# Patient Record
Sex: Female | Born: 1955 | Race: Black or African American | Hispanic: No | Smoking: Current every day smoker
Health system: Southern US, Community
[De-identification: ages and names within clinical notes are randomized; demographics above are authoritative.]

## PROBLEM LIST (undated history)

## (undated) DIAGNOSIS — Z800 Family history of malignant neoplasm of digestive organs: Secondary | ICD-10-CM

## (undated) DIAGNOSIS — Z8042 Family history of malignant neoplasm of prostate: Secondary | ICD-10-CM

## (undated) DIAGNOSIS — I10 Essential (primary) hypertension: Secondary | ICD-10-CM

## (undated) DIAGNOSIS — Z803 Family history of malignant neoplasm of breast: Secondary | ICD-10-CM

## (undated) DIAGNOSIS — E785 Hyperlipidemia, unspecified: Secondary | ICD-10-CM

## (undated) DIAGNOSIS — Z801 Family history of malignant neoplasm of trachea, bronchus and lung: Secondary | ICD-10-CM

## (undated) DIAGNOSIS — Z1379 Encounter for other screening for genetic and chromosomal anomalies: Secondary | ICD-10-CM

## (undated) DIAGNOSIS — L930 Discoid lupus erythematosus: Secondary | ICD-10-CM

## (undated) DIAGNOSIS — Z23 Encounter for immunization: Secondary | ICD-10-CM

## (undated) HISTORY — DX: Family history of malignant neoplasm of trachea, bronchus and lung: Z80.1

## (undated) HISTORY — DX: Family history of malignant neoplasm of digestive organs: Z80.0

## (undated) HISTORY — DX: Essential (primary) hypertension: I10

## (undated) HISTORY — DX: Family history of malignant neoplasm of breast: Z80.3

## (undated) HISTORY — DX: Encounter for immunization: Z23

## (undated) HISTORY — DX: Family history of malignant neoplasm of prostate: Z80.42

## (undated) HISTORY — DX: Hyperlipidemia, unspecified: E78.5

## (undated) HISTORY — DX: Discoid lupus erythematosus: L93.0

---
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
## 1999-09-18 ENCOUNTER — Other Ambulatory Visit: Admission: RE | Admit: 1999-09-18 | Discharge: 1999-09-18 | Payer: Self-pay | Admitting: Obstetrics and Gynecology

## 1999-09-25 ENCOUNTER — Encounter: Payer: Self-pay | Admitting: Obstetrics and Gynecology

## 1999-09-25 ENCOUNTER — Ambulatory Visit (HOSPITAL_COMMUNITY): Admission: RE | Admit: 1999-09-25 | Discharge: 1999-09-25 | Payer: Self-pay | Admitting: Obstetrics and Gynecology

## 1999-10-05 ENCOUNTER — Encounter: Payer: Self-pay | Admitting: Obstetrics and Gynecology

## 1999-10-05 ENCOUNTER — Encounter: Admission: RE | Admit: 1999-10-05 | Discharge: 1999-10-05 | Payer: Self-pay | Admitting: Obstetrics and Gynecology

## 2000-10-10 ENCOUNTER — Encounter: Payer: Self-pay | Admitting: Obstetrics and Gynecology

## 2000-10-10 ENCOUNTER — Ambulatory Visit (HOSPITAL_COMMUNITY): Admission: RE | Admit: 2000-10-10 | Discharge: 2000-10-10 | Payer: Self-pay | Admitting: Obstetrics and Gynecology

## 2001-09-29 ENCOUNTER — Other Ambulatory Visit: Admission: RE | Admit: 2001-09-29 | Discharge: 2001-09-29 | Payer: Self-pay | Admitting: Obstetrics and Gynecology

## 2001-10-16 ENCOUNTER — Encounter: Payer: Self-pay | Admitting: Obstetrics and Gynecology

## 2001-10-16 ENCOUNTER — Ambulatory Visit (HOSPITAL_COMMUNITY): Admission: RE | Admit: 2001-10-16 | Discharge: 2001-10-16 | Payer: Self-pay | Admitting: Obstetrics and Gynecology

## 2002-03-25 ENCOUNTER — Encounter: Payer: Self-pay | Admitting: Nephrology

## 2002-03-25 ENCOUNTER — Encounter: Admission: RE | Admit: 2002-03-25 | Discharge: 2002-03-25 | Payer: Self-pay | Admitting: Nephrology

## 2002-11-23 ENCOUNTER — Other Ambulatory Visit: Admission: RE | Admit: 2002-11-23 | Discharge: 2002-11-23 | Payer: Self-pay | Admitting: Obstetrics and Gynecology

## 2002-12-07 ENCOUNTER — Ambulatory Visit (HOSPITAL_COMMUNITY): Admission: RE | Admit: 2002-12-07 | Discharge: 2002-12-07 | Payer: Self-pay | Admitting: Obstetrics and Gynecology

## 2002-12-07 ENCOUNTER — Encounter: Payer: Self-pay | Admitting: Obstetrics and Gynecology

## 2003-12-16 ENCOUNTER — Ambulatory Visit (HOSPITAL_COMMUNITY): Admission: RE | Admit: 2003-12-16 | Discharge: 2003-12-16 | Payer: Self-pay | Admitting: Obstetrics and Gynecology

## 2005-02-07 ENCOUNTER — Ambulatory Visit (HOSPITAL_COMMUNITY): Admission: RE | Admit: 2005-02-07 | Discharge: 2005-02-07 | Payer: Self-pay | Admitting: Nephrology

## 2005-07-22 ENCOUNTER — Other Ambulatory Visit: Admission: RE | Admit: 2005-07-22 | Discharge: 2005-07-22 | Payer: Self-pay | Admitting: Obstetrics and Gynecology

## 2006-03-14 ENCOUNTER — Ambulatory Visit (HOSPITAL_COMMUNITY): Admission: RE | Admit: 2006-03-14 | Discharge: 2006-03-14 | Payer: Self-pay | Admitting: Nephrology

## 2006-03-14 IMAGING — MG MM DIGITAL SCREENING BILAT
4 series · 4 of 4 positions shown · non-contrast
Comparison: none

DG SCREEN MAMMOGRAM BILATERAL
Bilateral CC and MLO view(s) were taken.

DIGITAL SCREENING MAMMOGRAM WITH CAD:
There is a  dense fibroglandular pattern.  No masses or malignant type calcifications are 
identified.  Compared with prior studies.

[R CC]
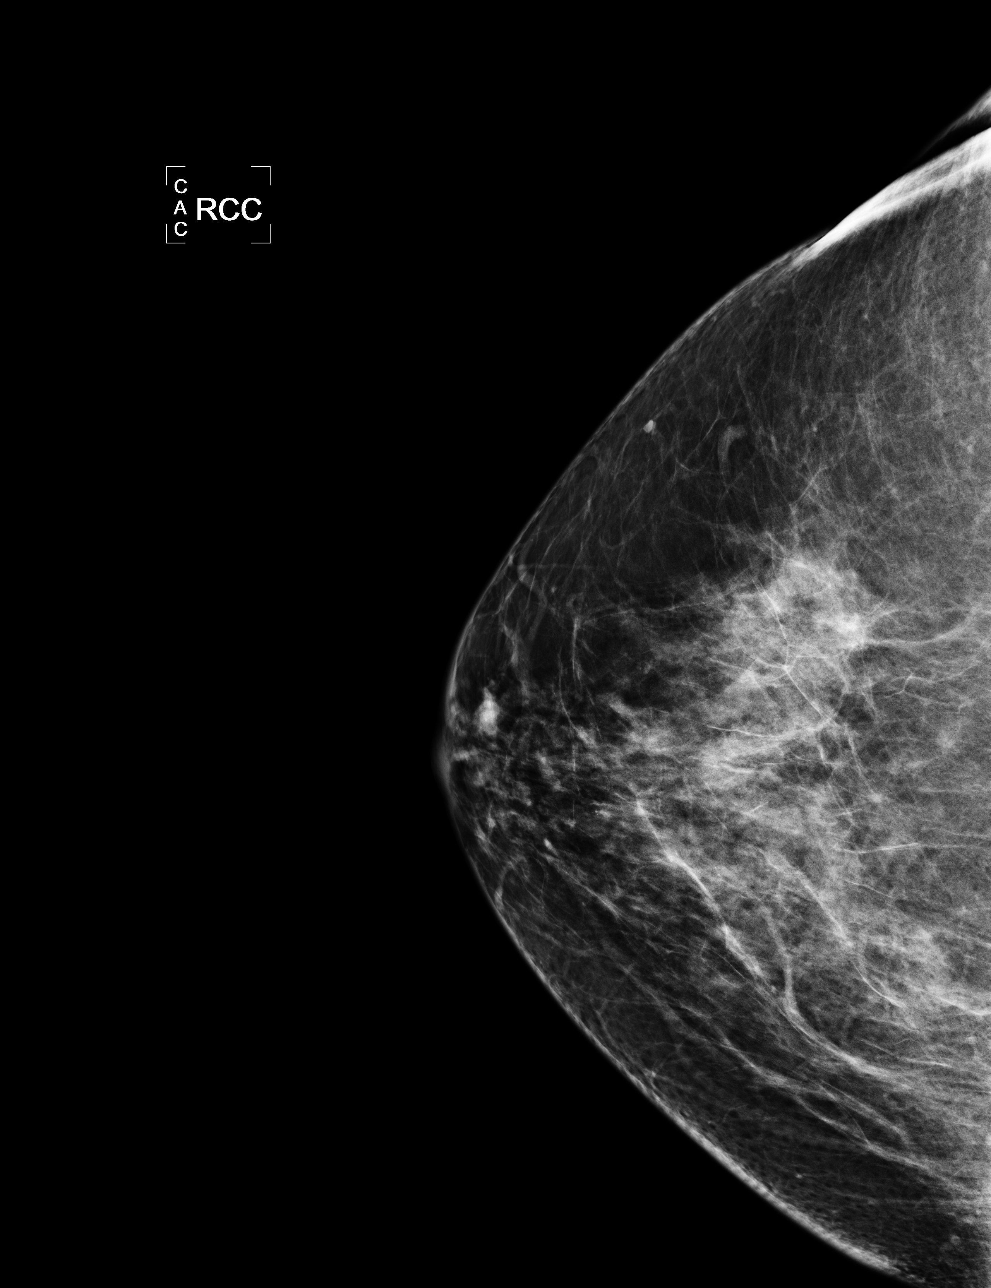

[R MLO]
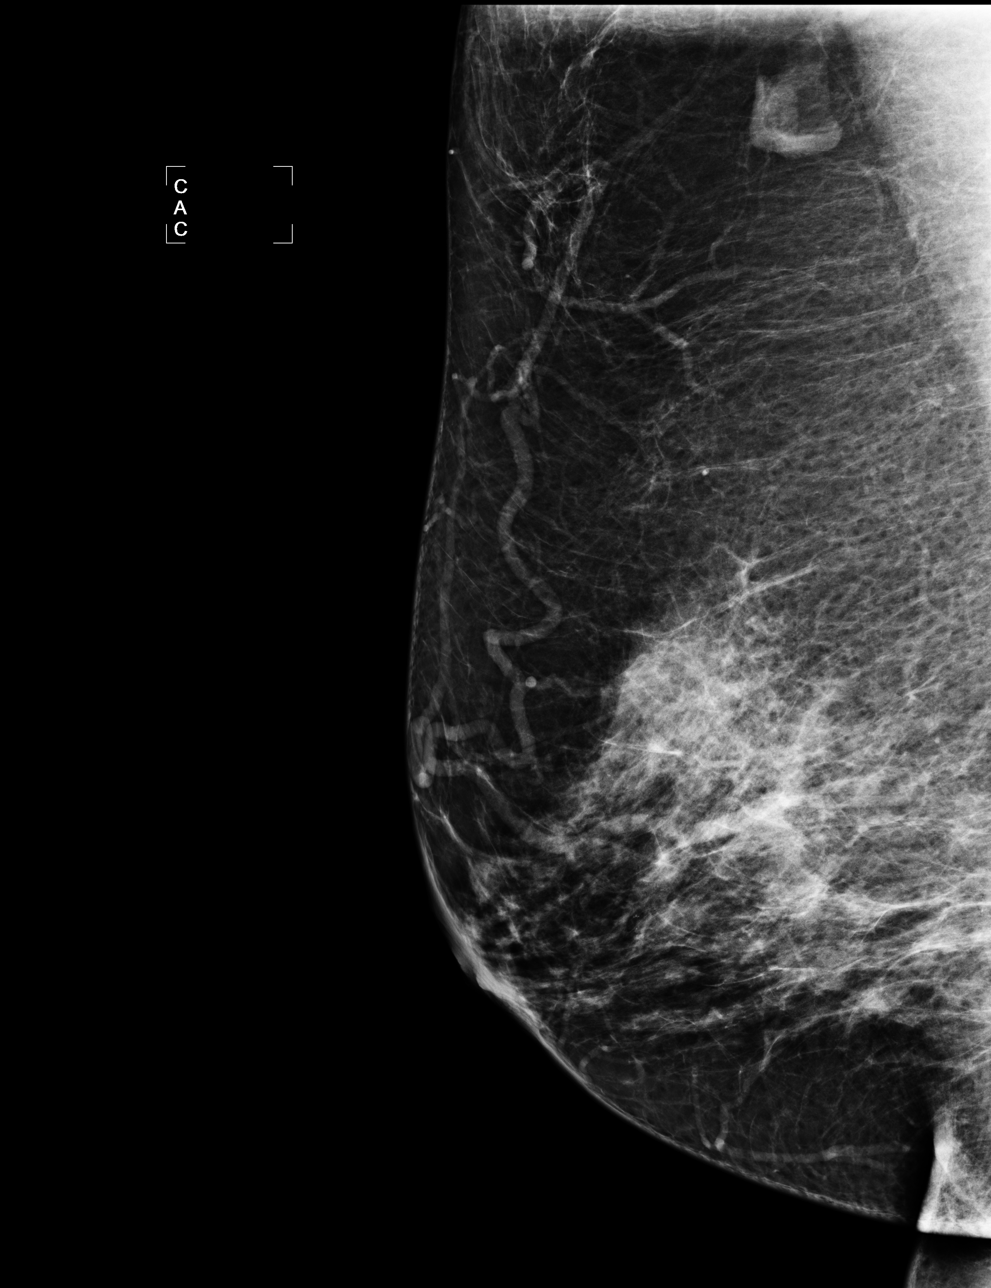

[L CC]
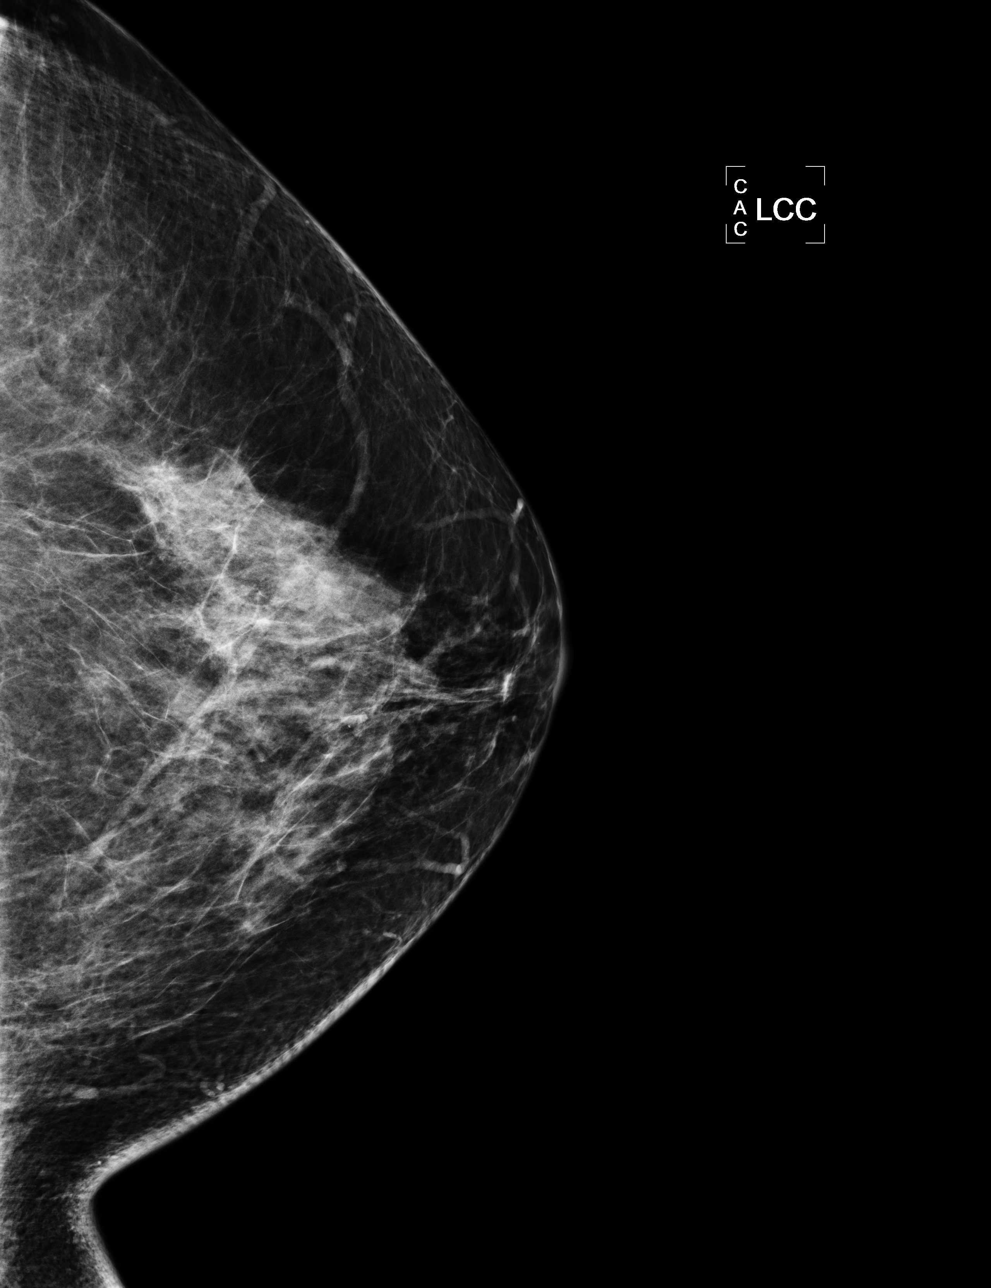

[L MLO]
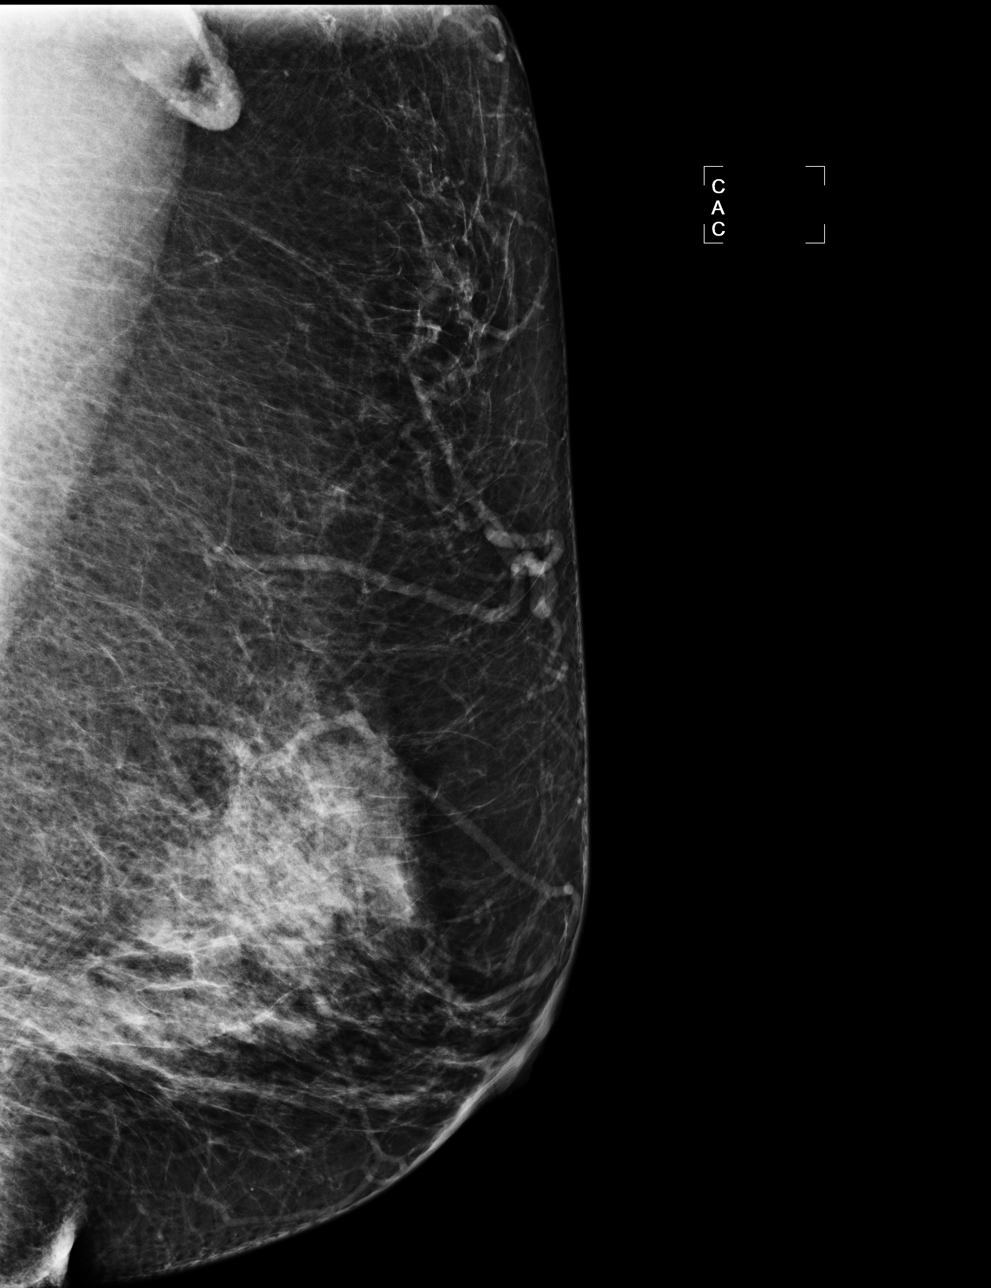

[4 of 4 positions shown; findings below may reference images not displayed]

IMPRESSION: No specific mammographic evidence of malignancy.  Next screening mammogram is recommended in one 
year.

ASSESSMENT: Negative - BI-RADS 1

Screening mammogram in 1 year.
ANALYZED BY COMPUTER AIDED DETECTION. , THIS PROCEDURE WAS A DIGITAL MAMMOGRAM.

## 2006-04-28 ENCOUNTER — Encounter: Admission: RE | Admit: 2006-04-28 | Discharge: 2006-04-28 | Payer: Self-pay | Admitting: Nephrology

## 2006-04-28 IMAGING — CR DG CHEST 2V
1 series · 1 of 1 positions shown · non-contrast
Comparison: none

CLINICAL DATA: Cough. 
 TWO VIEW CHEST: 
 Lower lungs are clear.  Cardiac and mediastinal contours are normal.  Osseous structures are unremarkable.

[w chest pa]
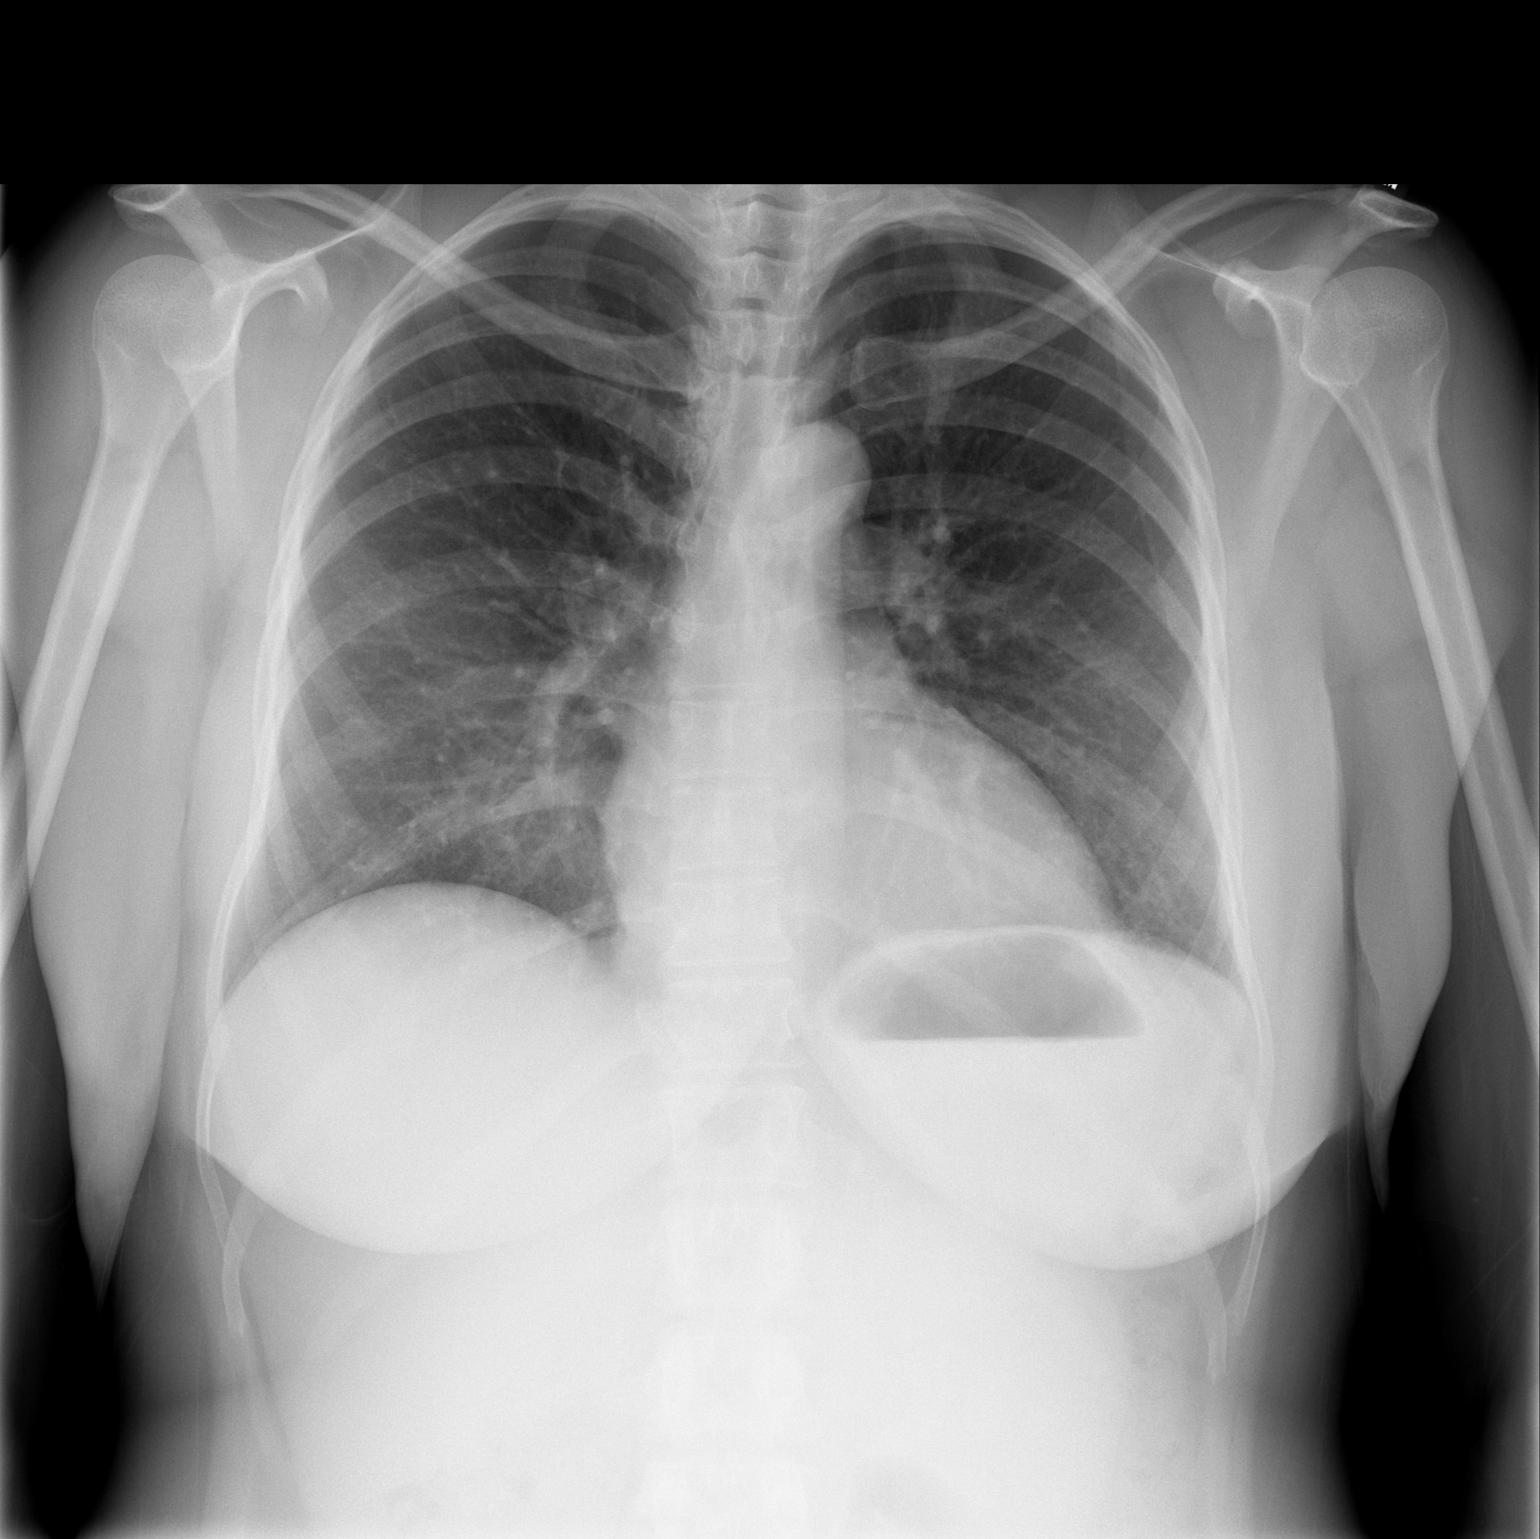

[1 of 1 positions shown; findings below may reference images not displayed]

IMPRESSION: Negative for acute cardiopulmonary process.

## 2006-04-28 IMAGING — CR DG KNEE COMPLETE 4+V*L*
4 series · 4 of 4 positions shown · non-contrast
Comparison: none

CLINICAL DATA: Dizziness and black out with fall on [REDACTED], [DATE].  Injured bilateral knees.  Bilateral anterior knee pain. 
 LEFT KNEE ? 4 VIEW:

[t knee ap left (1 of 2)]
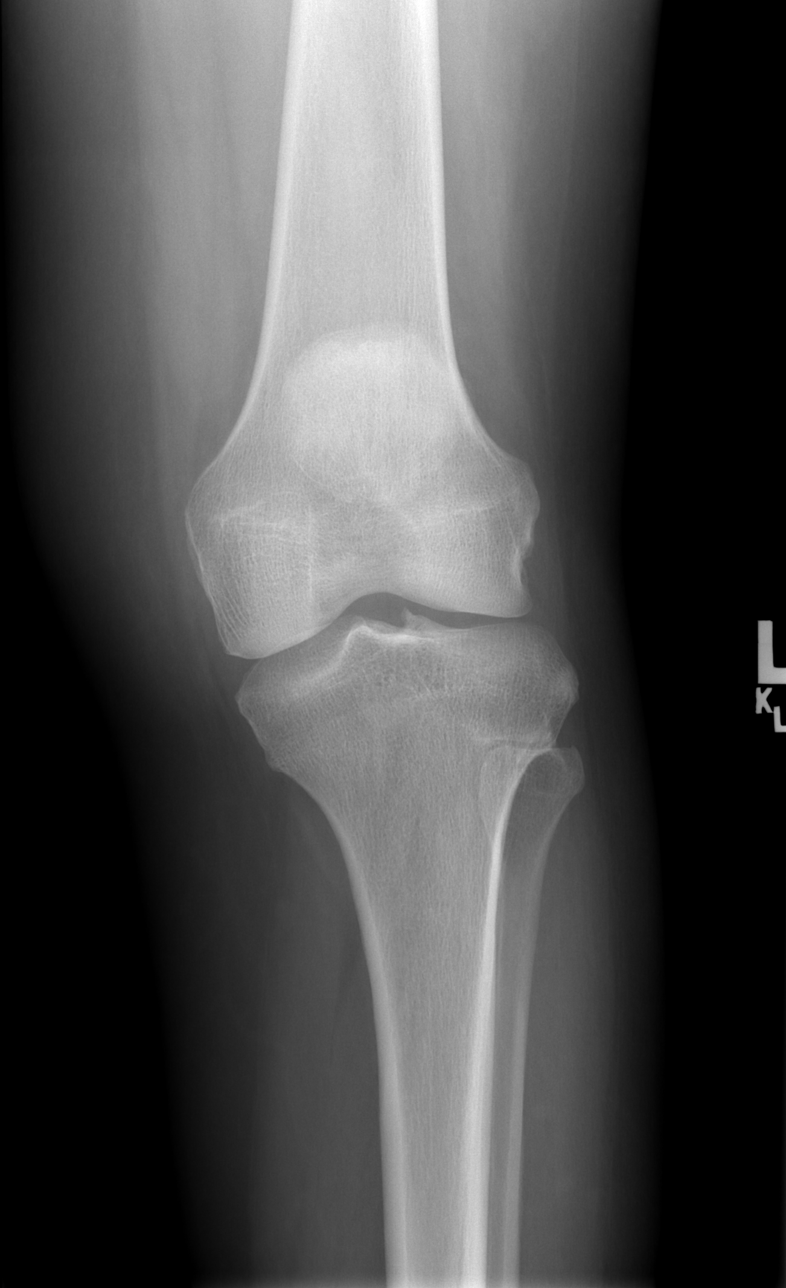

[t knee ap left (2 of 2)]
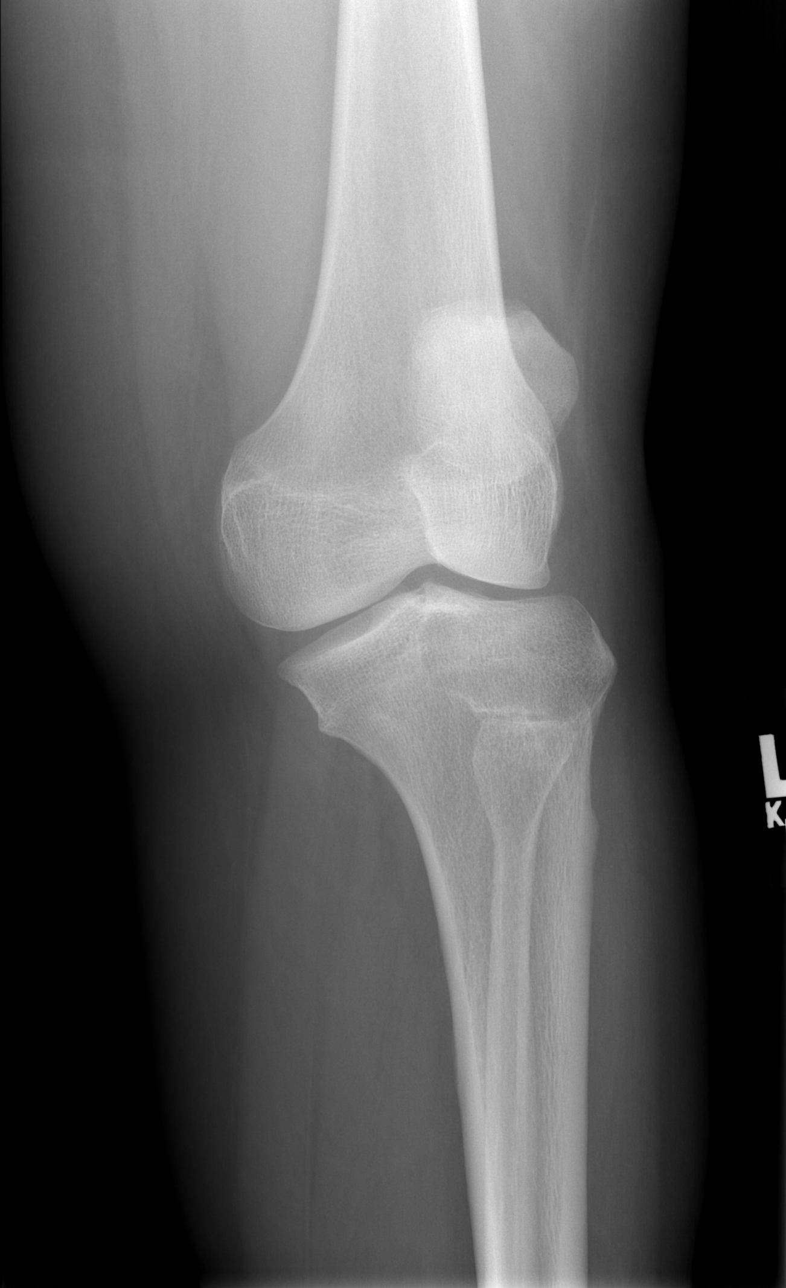

[t knee lat left (1 of 2)]
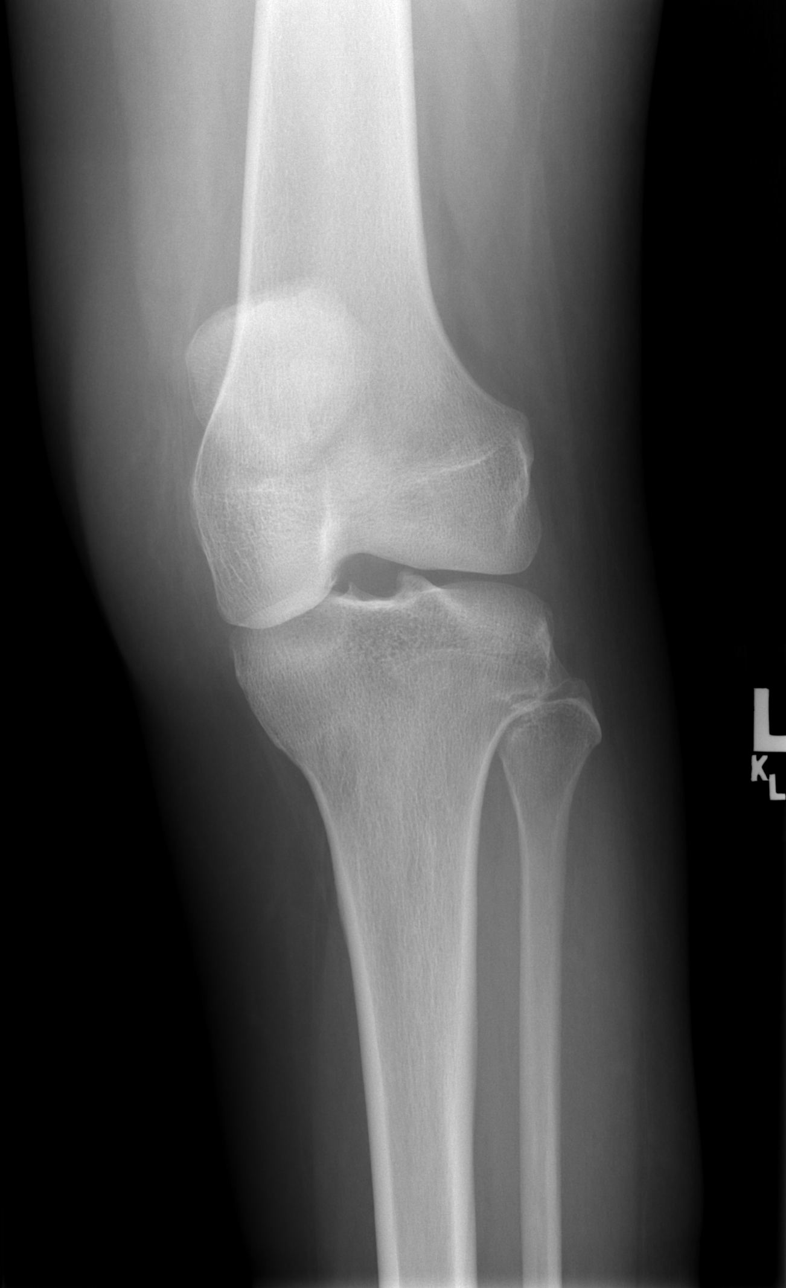

[t knee lat left (2 of 2)]
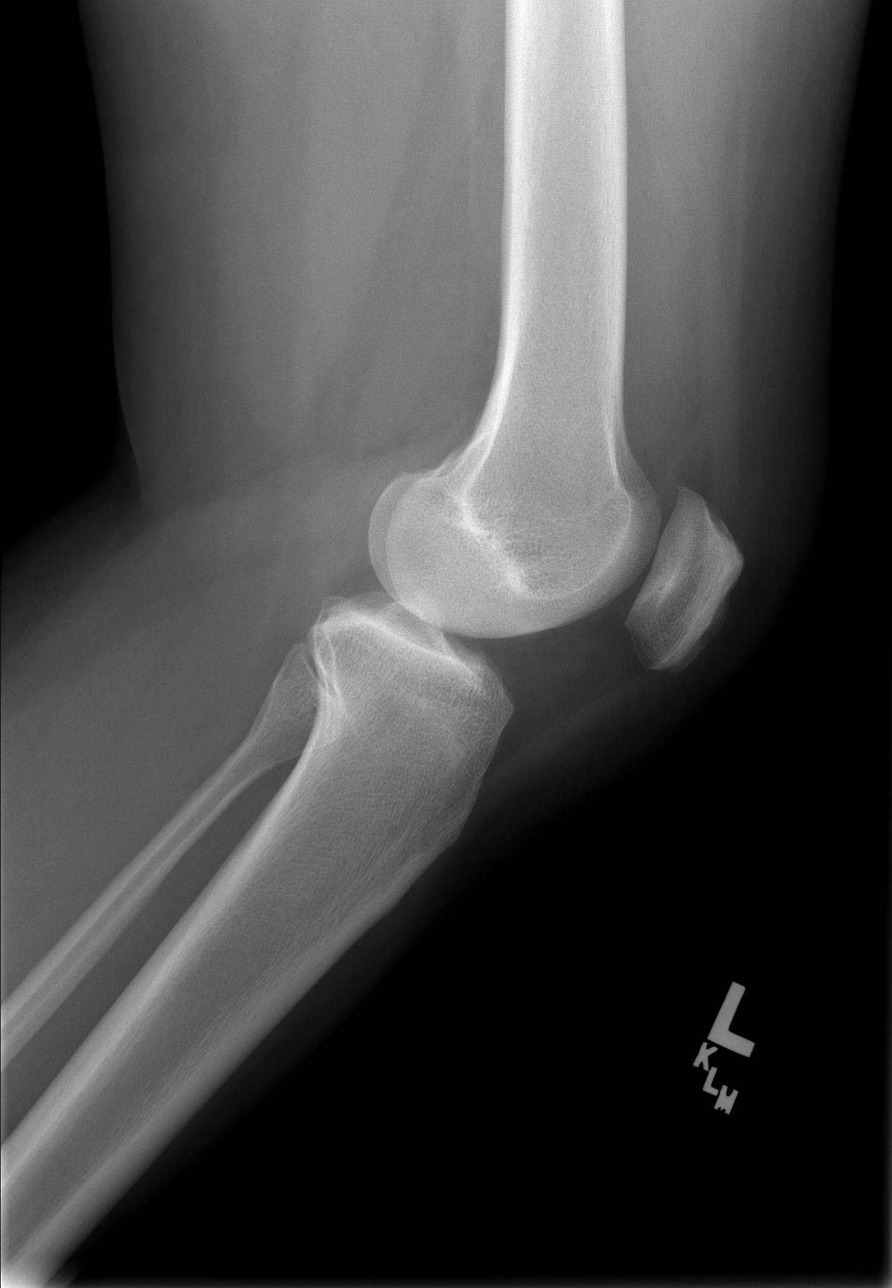

[4 of 4 positions shown; findings below may reference images not displayed]

FINDINGS: Prepatellar soft tissue swelling without underlying fracture or joint effusion.
IMPRESSION: Prepatellar soft tissue swelling without fracture. 
 RIGHT KNEE ? 4 VIEW:
FINDINGS: Prepatellar soft tissue swelling without fracture.
IMPRESSION: Prepatellar soft tissue swelling without fracture.

## 2006-04-28 IMAGING — CT CT HEAD W/O CM
1 series · 16 of 28 positions shown, 20 images · IV contrast (agent unspecified)
Comparison: none

CLINICAL DATA: Dizziness.  Syncope. 
 HEAD CT WITHOUT CONTRAST:
TECHNIQUE: Contiguous axial images were obtained from the base of the skull through the vertex according to standard protocol without contrast.

[Series 32: 3d filtered head · axial · 0.49mm/px · z∈[+6,+135]mm · 16 of 28 slices shown, 20 images]
[im 2/28  brain]
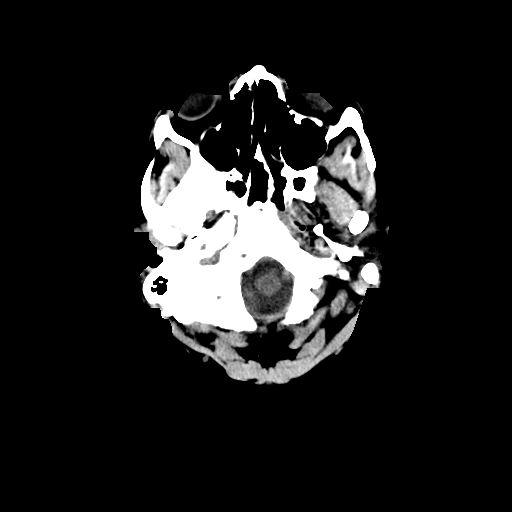
[im 2/28  bone]
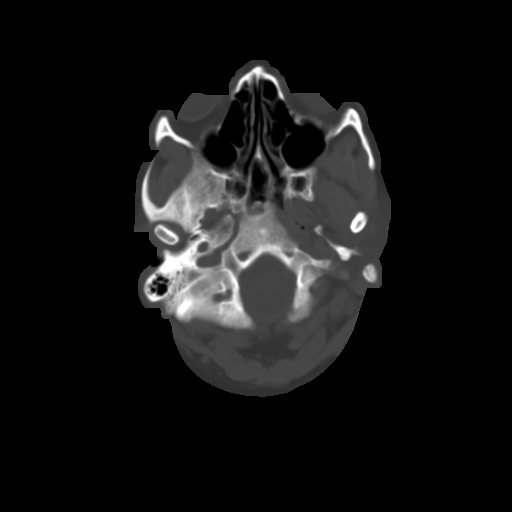
[im 4/28  brain]
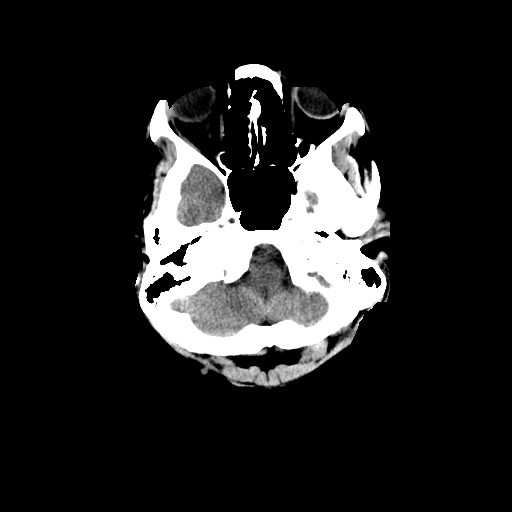
[im 6/28  brain]
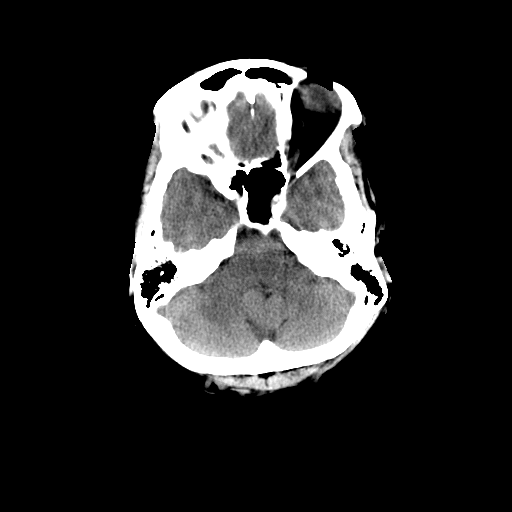
[im 7/28  brain]
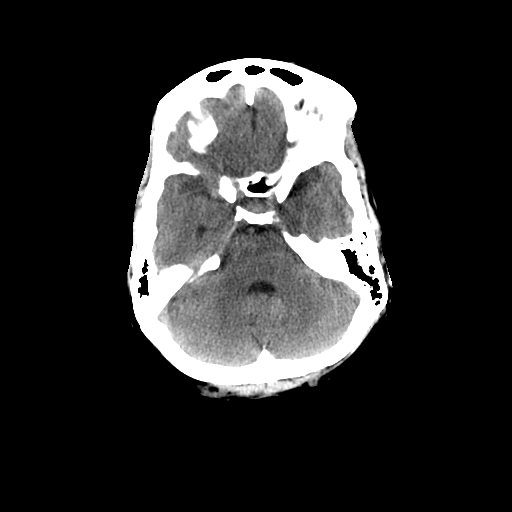
[im 9/28  brain]
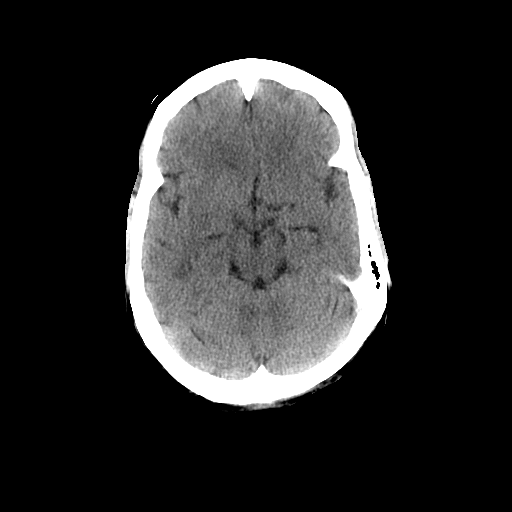
[im 9/28  bone]
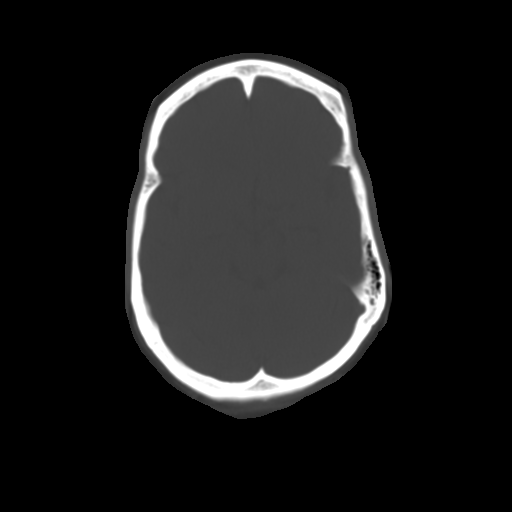
[im 10/28  brain]
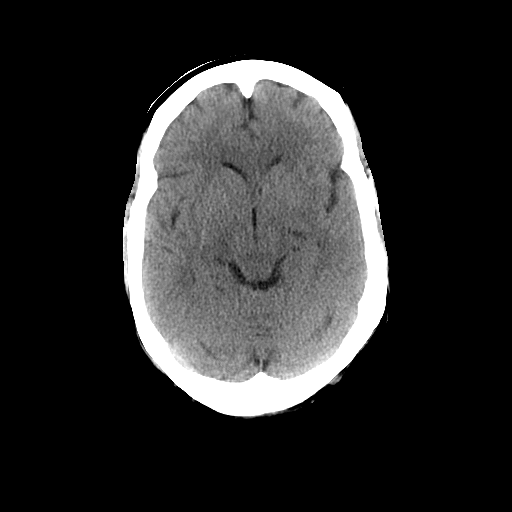
[im 12/28  brain]
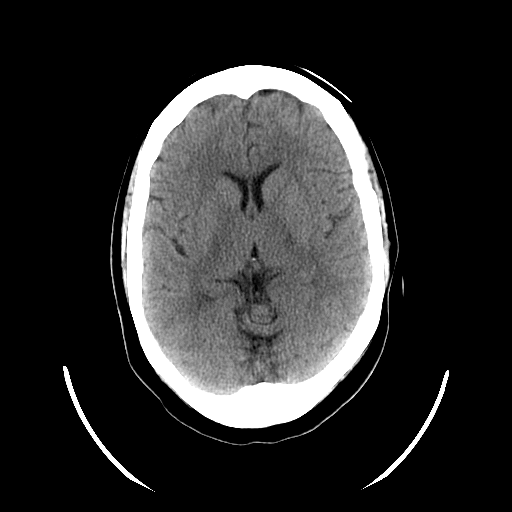
[im 14/28  brain]
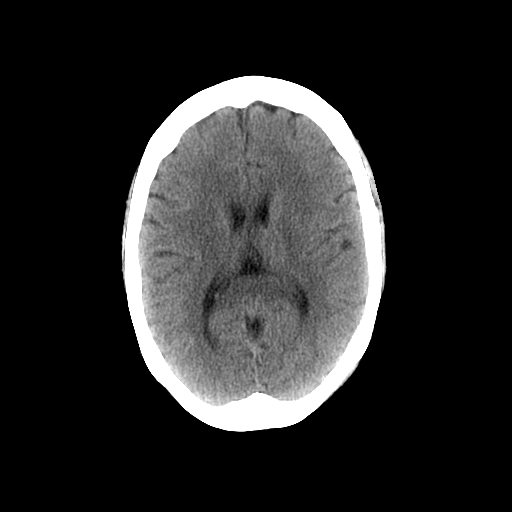
[im 15/28  brain]
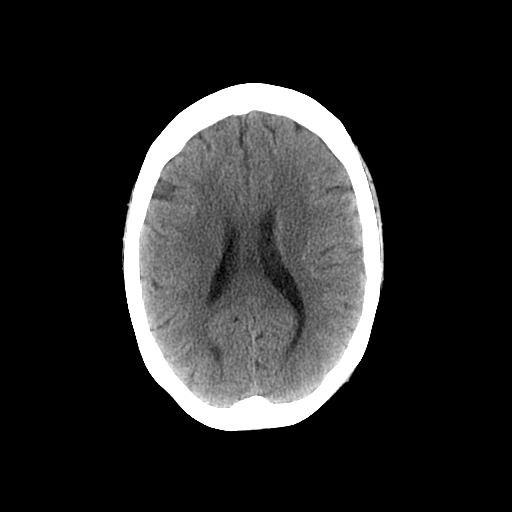
[im 15/28  bone]
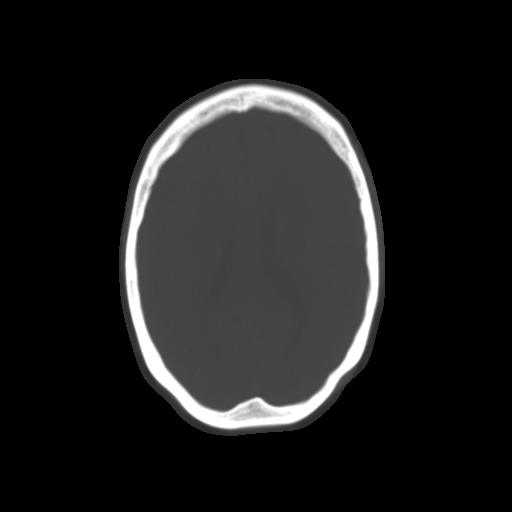
[im 17/28  brain]
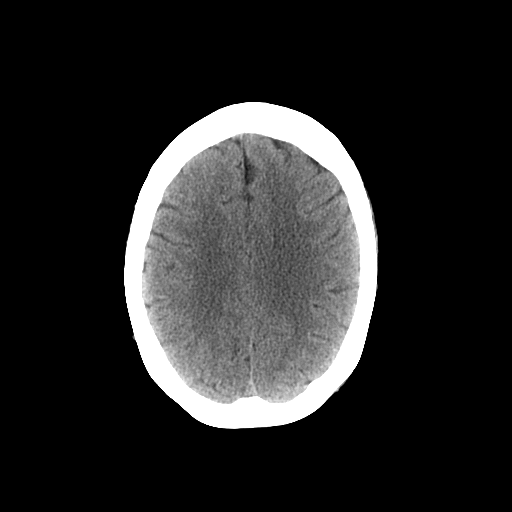
[im 19/28  brain]
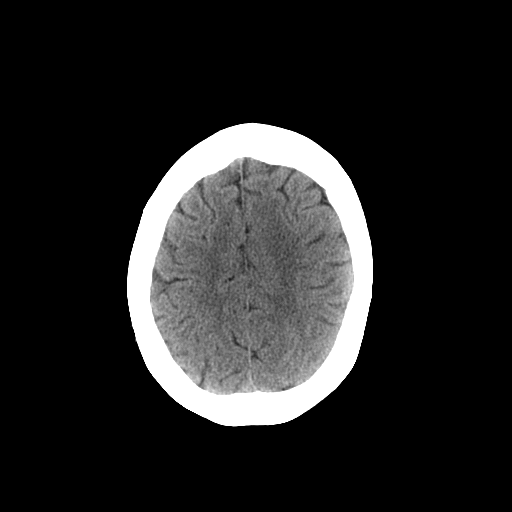
[im 20/28  brain]
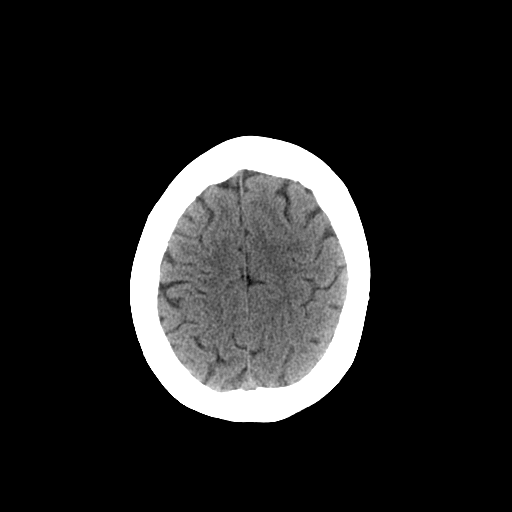
[im 22/28  brain]
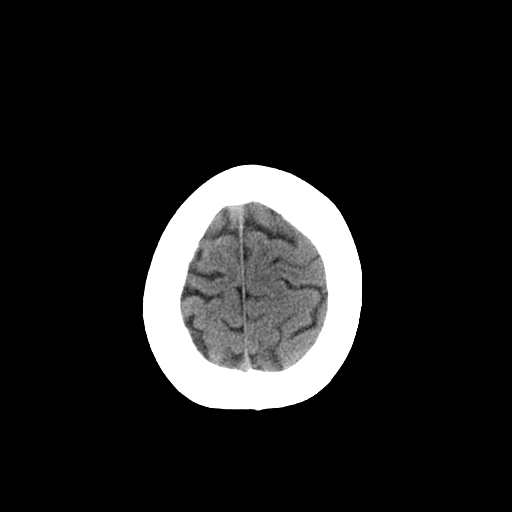
[im 22/28  bone]
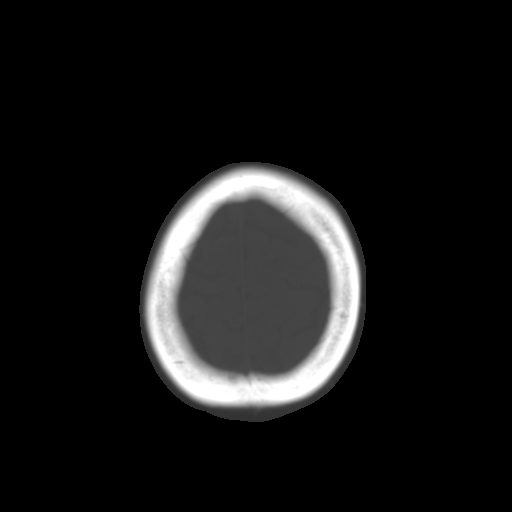
[im 23/28  brain]
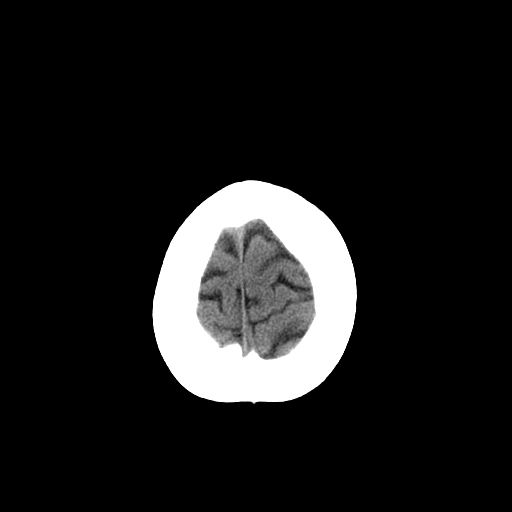
[im 25/28  brain]
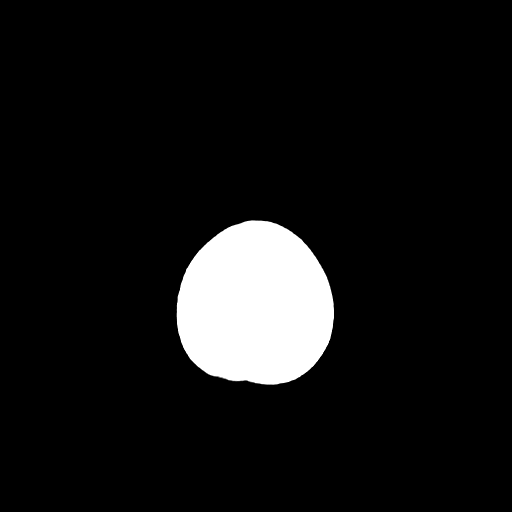
[im 27/28  brain]
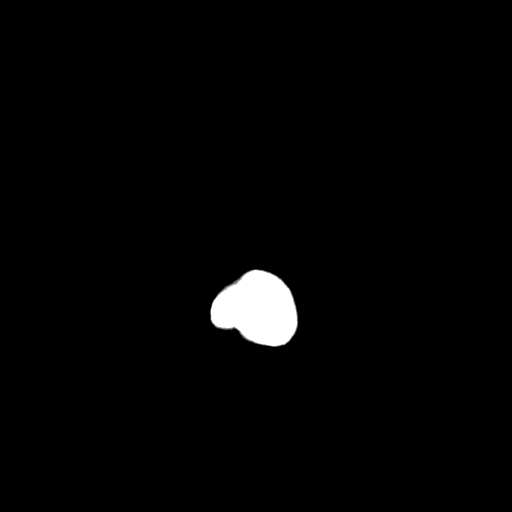

[16 of 28 positions shown; findings below may reference images not displayed]

FINDINGS: The ventricular system is normal in size and configuration and the septum is midline in position.  No acute intracranial abnormality is seen.  No blood, edema, or mass effect is noted.  No bony abnormality is seen.
IMPRESSION: No acute intracranial abnormality.

## 2006-04-28 IMAGING — CR DG KNEE COMPLETE 4+V*R*
3 series · 3 of 3 positions shown · non-contrast
Comparison: none

CLINICAL DATA: Dizziness and black out with fall on [REDACTED], [DATE].  Injured bilateral knees.  Bilateral anterior knee pain. 
 LEFT KNEE ? 4 VIEW:

[t knee ap right (1 of 2)]
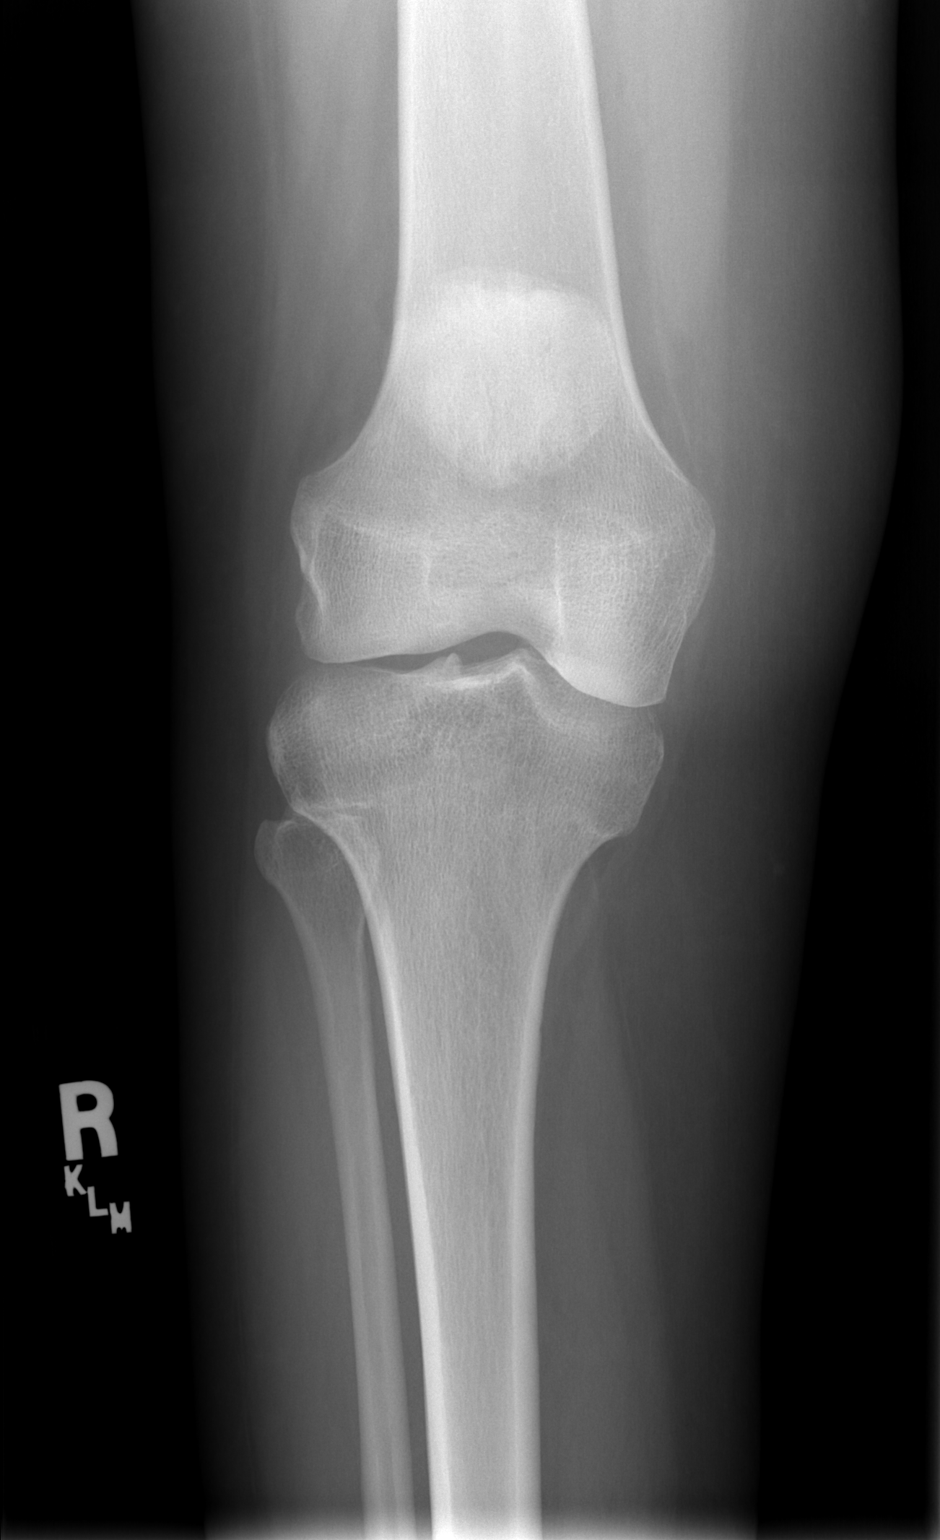

[t knee ap right (2 of 2)]
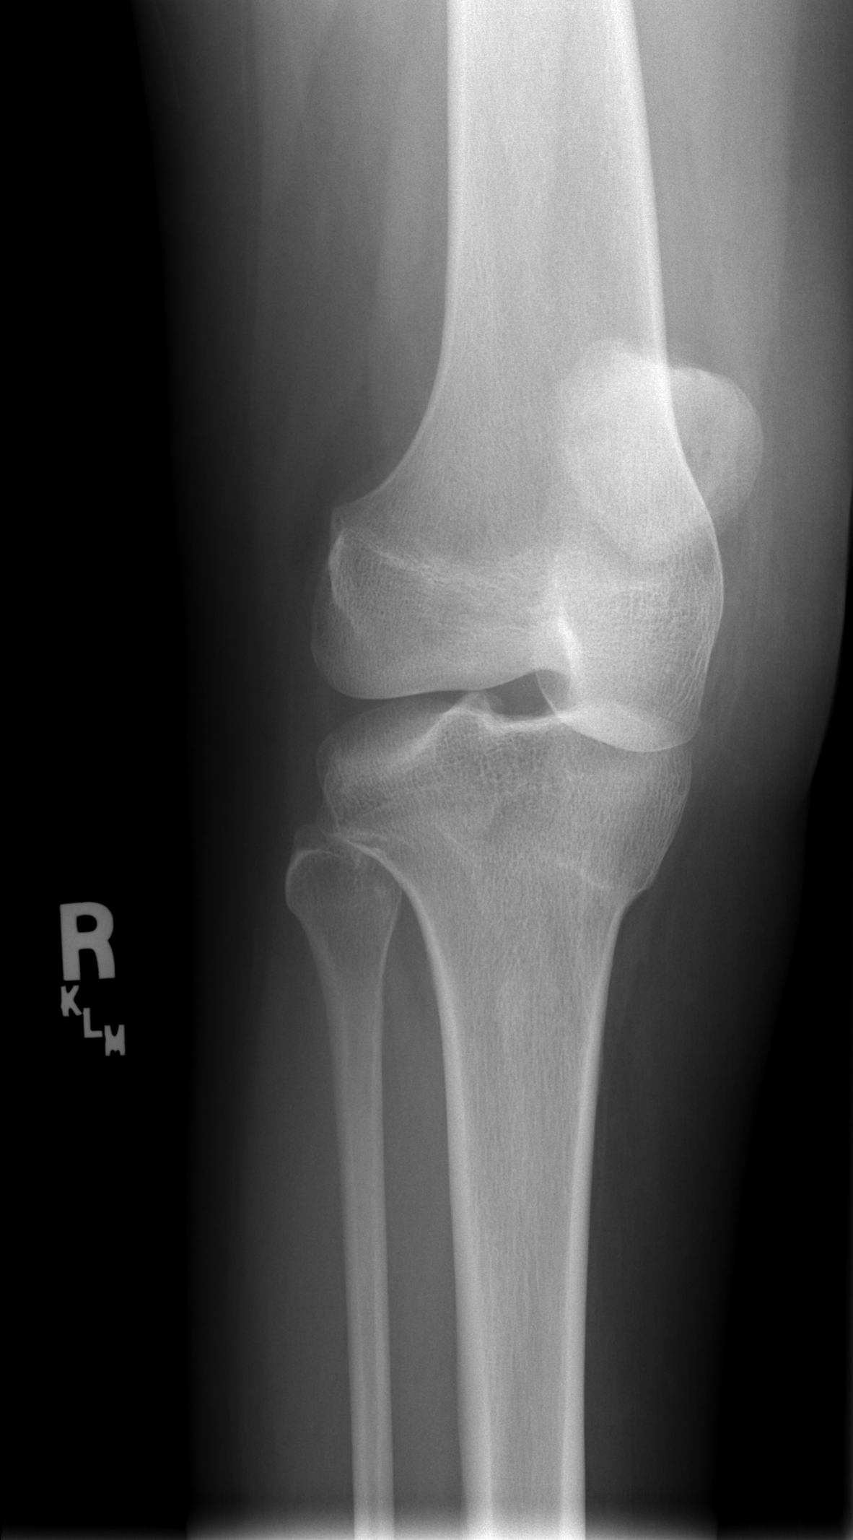

[t knee lat right]
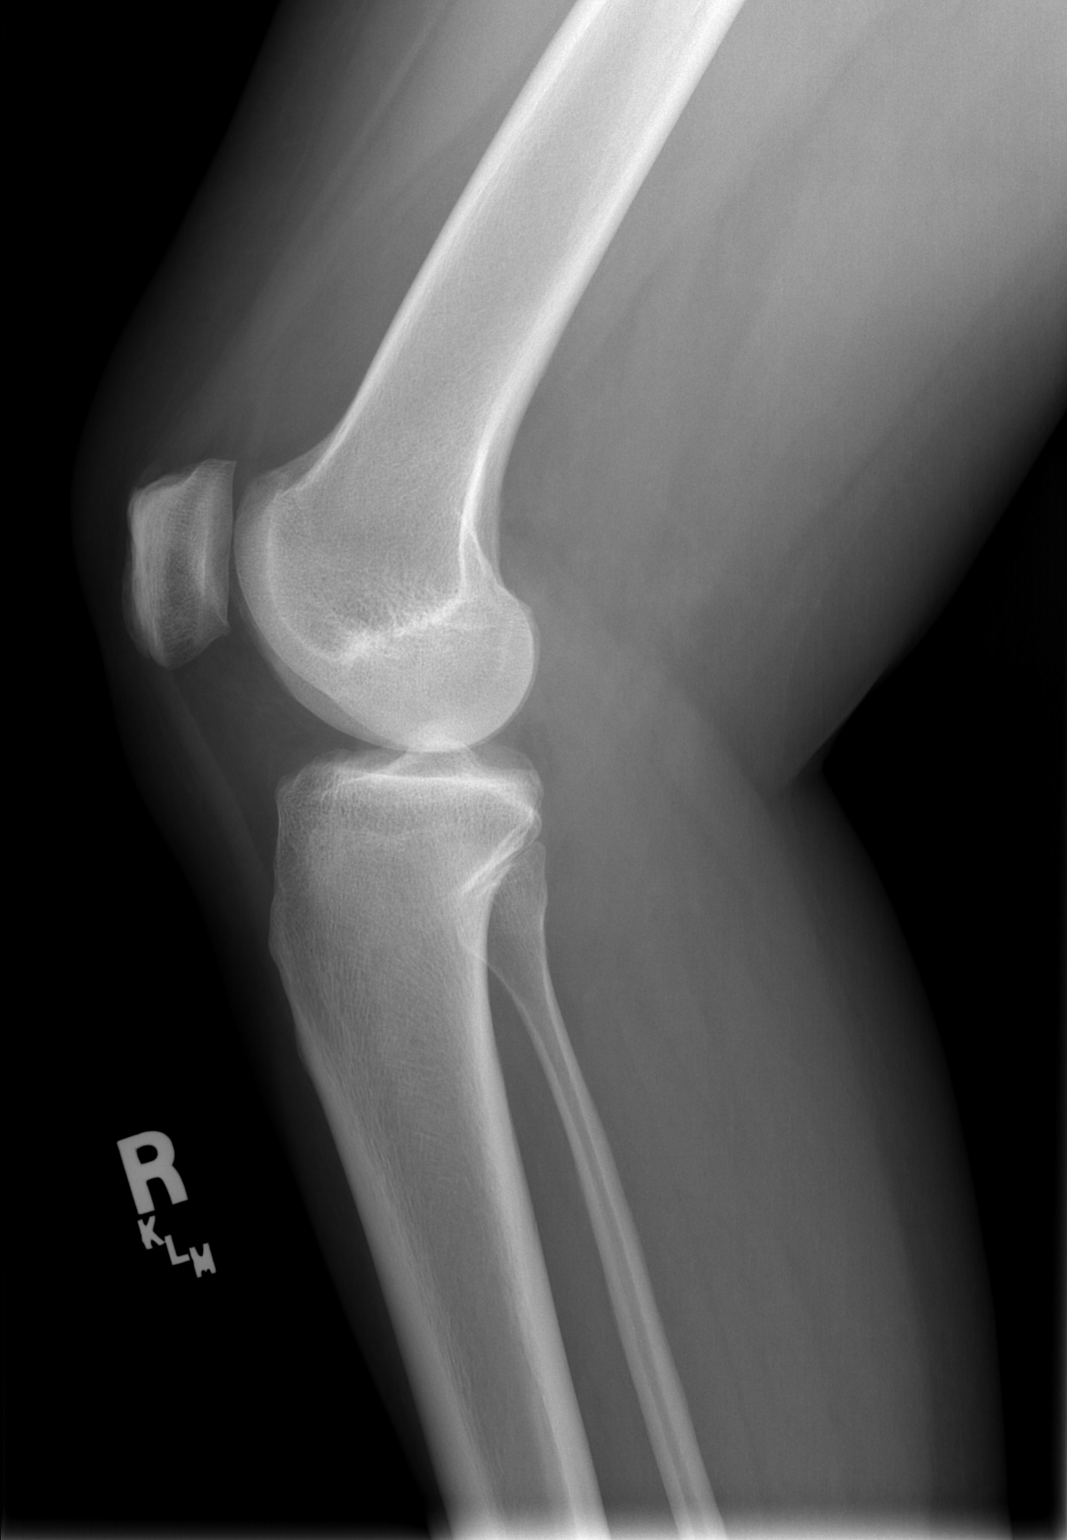

[3 of 3 positions shown; findings below may reference images not displayed]

FINDINGS: Prepatellar soft tissue swelling without underlying fracture or joint effusion.
IMPRESSION: Prepatellar soft tissue swelling without fracture. 
 RIGHT KNEE ? 4 VIEW:
FINDINGS: Prepatellar soft tissue swelling without fracture.
IMPRESSION: Prepatellar soft tissue swelling without fracture.

## 2006-04-28 IMAGING — CR DG KNEE COMPLETE 4+V*R*
1 series · 1 of 1 positions shown · non-contrast
Comparison: none

CLINICAL DATA: Dizziness and black out with fall on [REDACTED], [DATE].  Injured bilateral knees.  Bilateral anterior knee pain. 
 LEFT KNEE ? 4 VIEW:

[t knee ap left]
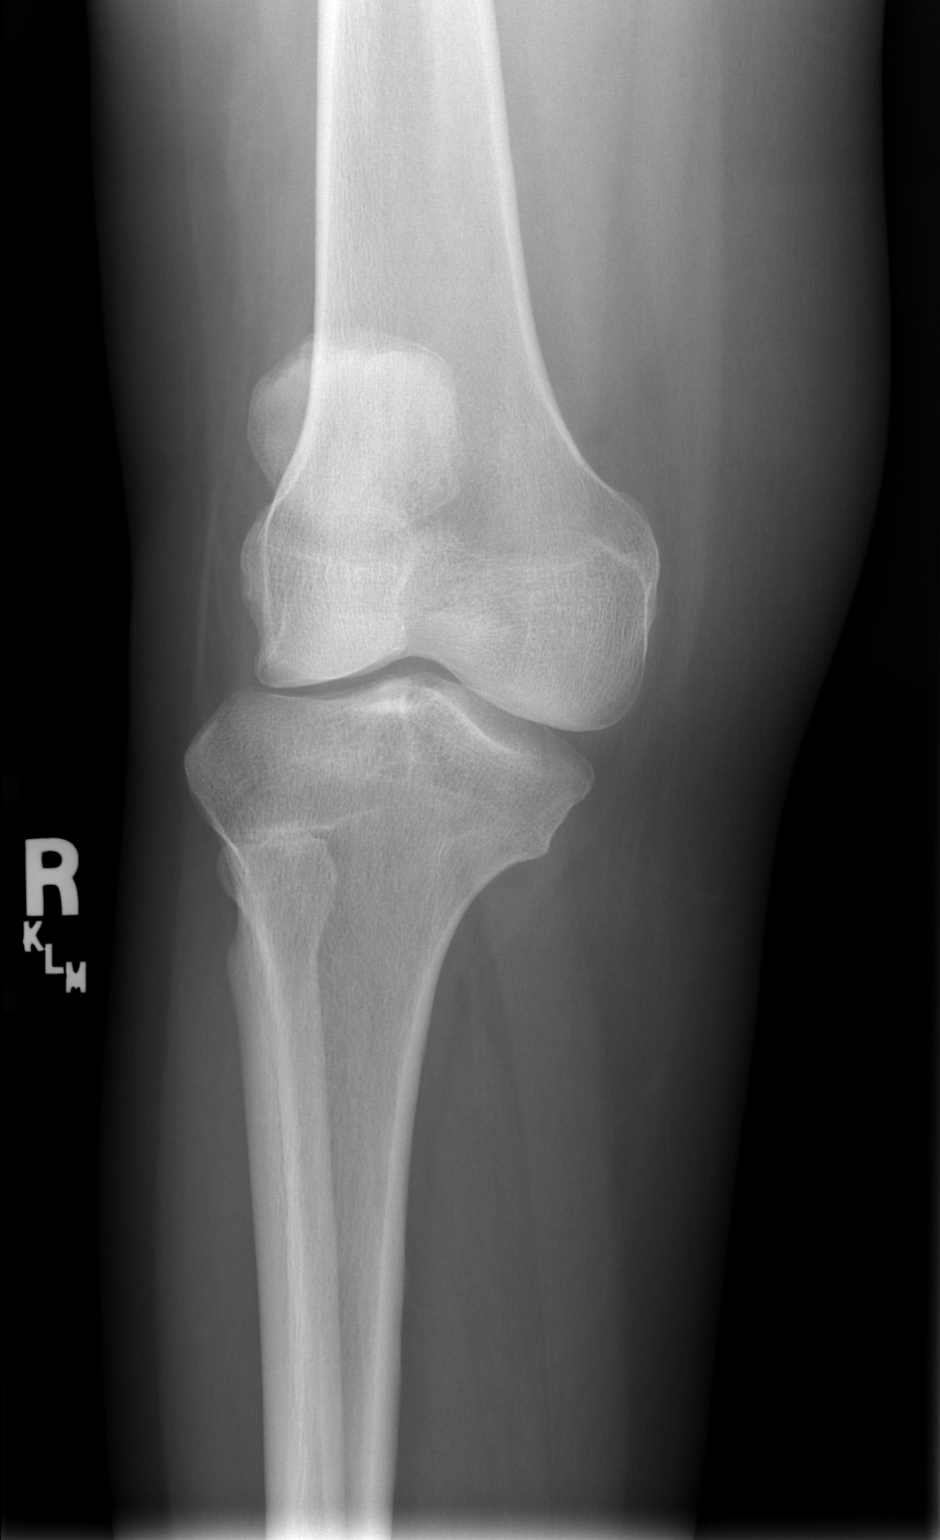

[1 of 1 positions shown; findings below may reference images not displayed]

FINDINGS: Prepatellar soft tissue swelling without underlying fracture or joint effusion.
IMPRESSION: Prepatellar soft tissue swelling without fracture. 
 RIGHT KNEE ? 4 VIEW:
FINDINGS: Prepatellar soft tissue swelling without fracture.
IMPRESSION: Prepatellar soft tissue swelling without fracture.

## 2011-03-24 ENCOUNTER — Emergency Department (HOSPITAL_COMMUNITY)
Admission: EM | Admit: 2011-03-24 | Discharge: 2011-03-24 | Disposition: A | Discharge status: Home / Self Care | Payer: Self-pay | Attending: Emergency Medicine | Admitting: Emergency Medicine

## 2011-03-24 ENCOUNTER — Encounter: Payer: Self-pay | Admitting: Emergency Medicine

## 2011-03-24 DIAGNOSIS — F17200 Nicotine dependence, unspecified, uncomplicated: Secondary | ICD-10-CM | POA: Insufficient documentation

## 2011-03-24 DIAGNOSIS — R3919 Other difficulties with micturition: Secondary | ICD-10-CM | POA: Insufficient documentation

## 2011-03-24 DIAGNOSIS — R509 Fever, unspecified: Secondary | ICD-10-CM | POA: Insufficient documentation

## 2011-03-24 DIAGNOSIS — R21 Rash and other nonspecific skin eruption: Secondary | ICD-10-CM

## 2011-03-24 DIAGNOSIS — N390 Urinary tract infection, site not specified: Secondary | ICD-10-CM

## 2011-03-24 LAB — URINE MICROSCOPIC-ADD ON

## 2011-03-24 LAB — COMPREHENSIVE METABOLIC PANEL
AST: 15 U/L (ref 0–37)
Albumin: 3.2 g/dL — ABNORMAL LOW (ref 3.5–5.2)
Calcium: 9.7 mg/dL (ref 8.4–10.5)
Chloride: 102 mEq/L (ref 96–112)
Creatinine, Ser: 0.49 mg/dL — ABNORMAL LOW (ref 0.50–1.10)
Potassium: 3.4 mEq/L — ABNORMAL LOW (ref 3.5–5.1)
Sodium: 139 mEq/L (ref 135–145)
Total Protein: 8.5 g/dL — ABNORMAL HIGH (ref 6.0–8.3)

## 2011-03-24 LAB — URINALYSIS, ROUTINE W REFLEX MICROSCOPIC
Bilirubin Urine: NEGATIVE
Glucose, UA: NEGATIVE mg/dL
Protein, ur: 30 mg/dL — AB

## 2011-03-24 LAB — CBC
MCHC: 34.5 g/dL (ref 30.0–36.0)
MCV: 91.1 fL (ref 78.0–100.0)
WBC: 4.5 10*3/uL (ref 4.0–10.5)

## 2011-03-24 LAB — DIFFERENTIAL
Basophils Absolute: 0 10*3/uL (ref 0.0–0.1)
Lymphocytes Relative: 32 % (ref 12–46)
Neutro Abs: 2.6 10*3/uL (ref 1.7–7.7)
Neutrophils Relative %: 59 % (ref 43–77)

## 2011-03-24 MED ORDER — SODIUM CHLORIDE 0.9 % IV SOLN
Freq: Once | INTRAVENOUS | Status: AC
  Administered 2011-03-24: 12:00:00 via INTRAVENOUS

## 2011-03-24 MED ORDER — PREDNISONE 10 MG PO TABS: 20.0000 mg | ORAL_TABLET | Freq: Every day | ORAL | Status: AC

## 2011-03-24 MED ORDER — CEPHALEXIN 500 MG PO CAPS: 500.0000 mg | ORAL_CAPSULE | Freq: Four times a day (QID) | ORAL | Status: AC

## 2011-03-24 NOTE — ED Provider Notes (Signed)
History     CSN: [id number] Arrival date & time: 03/24/2011  8:23 AM   First MD Initiated Contact with Patient 03/24/11 1119      Chief Complaint  Patient presents with  . Herpes Zoster    (Consider location/radiation/quality/duration/timing/severity/associated sxs/prior treatment) The history is provided by the patient.  patient here with rash localized to her upper and lower extremities x3 days. No itching component to this, some low-grade fever noted with dark urine. No prior history of this, no new medications used or chemical exposure. Patient notes pain associated with the rash, denies any mucosal involvement. No medications taken for this prior to her  History reviewed. No pertinent past medical history.  History reviewed. No pertinent past surgical history.  No family history on file.  History  Substance Use Topics  . Smoking status: Current Everyday Smoker  . Smokeless tobacco: Not on file  . Alcohol Use: Yes    OB History    Grav Para Term Preterm Abortions TAB SAB Ect Mult Living                  Review of Systems  All other systems reviewed and are negative.    Allergies  Review of patient's allergies indicates no known allergies.  Home Medications  No current outpatient prescriptions on file.  BP 140/88  Pulse 103  Temp(Src) 98.3 F (36.8 C) (Oral)  Resp 16  SpO2 98%  LMP 02/27/2011  Physical Exam  Nursing note and vitals reviewed. Constitutional: She is oriented to person, place, and time. She appears well-developed and well-nourished.  Non-toxic appearance. No distress.  HENT:  Head: Normocephalic and atraumatic.  Eyes: Conjunctivae and EOM are normal. Pupils are equal, round, and reactive to light.  Neck: Normal range of motion. Neck supple. No tracheal deviation present.  Cardiovascular: Normal rate, regular rhythm and normal heart sounds.  Exam reveals no gallop.   No murmur heard. Pulmonary/Chest: Effort normal and breath sounds  normal. No stridor. No respiratory distress. She has no wheezes.  Abdominal: Soft. Normal appearance and bowel sounds are normal. She exhibits no distension. There is no tenderness. There is no rebound.  Musculoskeletal: Normal range of motion. She exhibits no edema and no tenderness.       Patient with rash noted in skin exam to her bilateral upper or lower extremity`  Neurological: She is alert and oriented to person, place, and time. She has normal strength. No cranial nerve deficit or sensory deficit. GCS eye subscore is 4. GCS verbal subscore is 5. GCS motor subscore is 6.  Skin: Skin is warm and dry. Rash noted. No ecchymosis noted. Rash is nodular. There is erythema.  Psychiatric: She has a normal mood and affect. Her speech is normal and behavior is normal.    ED Course  Procedures (including critical care time)   Labs Reviewed  CBC  DIFFERENTIAL  COMPREHENSIVE METABOLIC PANEL   No results found.   No diagnosis found.    MDM    Patient urinalysis results noted will treat for infection. Suspect patient's rash is from some type of autoimmune process we'll place on corticosteroids and give patient referral to dermatology and patient given return instructions for here      Toy Baker, MD 03/24/11 [phone]

## 2011-03-24 NOTE — ED Notes (Signed)
Pt reports "I think I have shingles."  Pt reports began feeling bad Tuesday but thought she had the flu. Pt noticed on Friday red bumps on arms, legs and chest on Friday.

## 2011-03-24 NOTE — ED Notes (Signed)
Pt awaiting eval by ED provider.

## 2011-03-24 NOTE — ED Notes (Signed)
Patient reports onset of rash and fever on Friday.  She denies any recent travel.  She states she has pain when moving/ambulating.  Patient has whelp like rash noted to her extremities and on her chest. Patient denies n/v.  Denies headache.  Patient alert and oriented.  No neuro deficits noted.

## 2011-03-24 NOTE — ED Notes (Signed)
Pt reports that her urine is dark and that she has been running a fever.

## 2012-10-20 ENCOUNTER — Encounter (HOSPITAL_COMMUNITY): Payer: Self-pay | Admitting: Physical Medicine and Rehabilitation

## 2012-10-20 ENCOUNTER — Emergency Department (HOSPITAL_COMMUNITY)
Admission: EM | Admit: 2012-10-20 | Discharge: 2012-10-20 | Disposition: A | Discharge status: Home / Self Care | Payer: Self-pay | Attending: Emergency Medicine | Admitting: Emergency Medicine

## 2012-10-20 ENCOUNTER — Emergency Department (HOSPITAL_COMMUNITY): Payer: Self-pay

## 2012-10-20 DIAGNOSIS — R1310 Dysphagia, unspecified: Secondary | ICD-10-CM | POA: Insufficient documentation

## 2012-10-20 DIAGNOSIS — J3489 Other specified disorders of nose and nasal sinuses: Secondary | ICD-10-CM | POA: Insufficient documentation

## 2012-10-20 DIAGNOSIS — R059 Cough, unspecified: Secondary | ICD-10-CM | POA: Insufficient documentation

## 2012-10-20 DIAGNOSIS — R509 Fever, unspecified: Secondary | ICD-10-CM

## 2012-10-20 DIAGNOSIS — J029 Acute pharyngitis, unspecified: Secondary | ICD-10-CM | POA: Insufficient documentation

## 2012-10-20 DIAGNOSIS — F17200 Nicotine dependence, unspecified, uncomplicated: Secondary | ICD-10-CM | POA: Insufficient documentation

## 2012-10-20 DIAGNOSIS — J189 Pneumonia, unspecified organism: Secondary | ICD-10-CM | POA: Insufficient documentation

## 2012-10-20 DIAGNOSIS — R05 Cough: Secondary | ICD-10-CM | POA: Insufficient documentation

## 2012-10-20 LAB — URINE MICROSCOPIC-ADD ON

## 2012-10-20 LAB — URINALYSIS, ROUTINE W REFLEX MICROSCOPIC
Glucose, UA: NEGATIVE mg/dL
Ketones, ur: >80 mg/dL — AB
Nitrite: NEGATIVE
Protein, ur: 100 mg/dL — AB
Specific Gravity, Urine: 1.017 (ref 1.005–1.030)
Urobilinogen, UA: 1 mg/dL (ref 0.0–1.0)
pH: 6 (ref 5.0–8.0)

## 2012-10-20 IMAGING — CR DG CHEST 2V
2 series · 2 of 2 positions shown · non-contrast
Comparison: [DATE]

CLINICAL DATA: Shortness of breath and cough.

CHEST - 2 VIEW

[w chest pa]
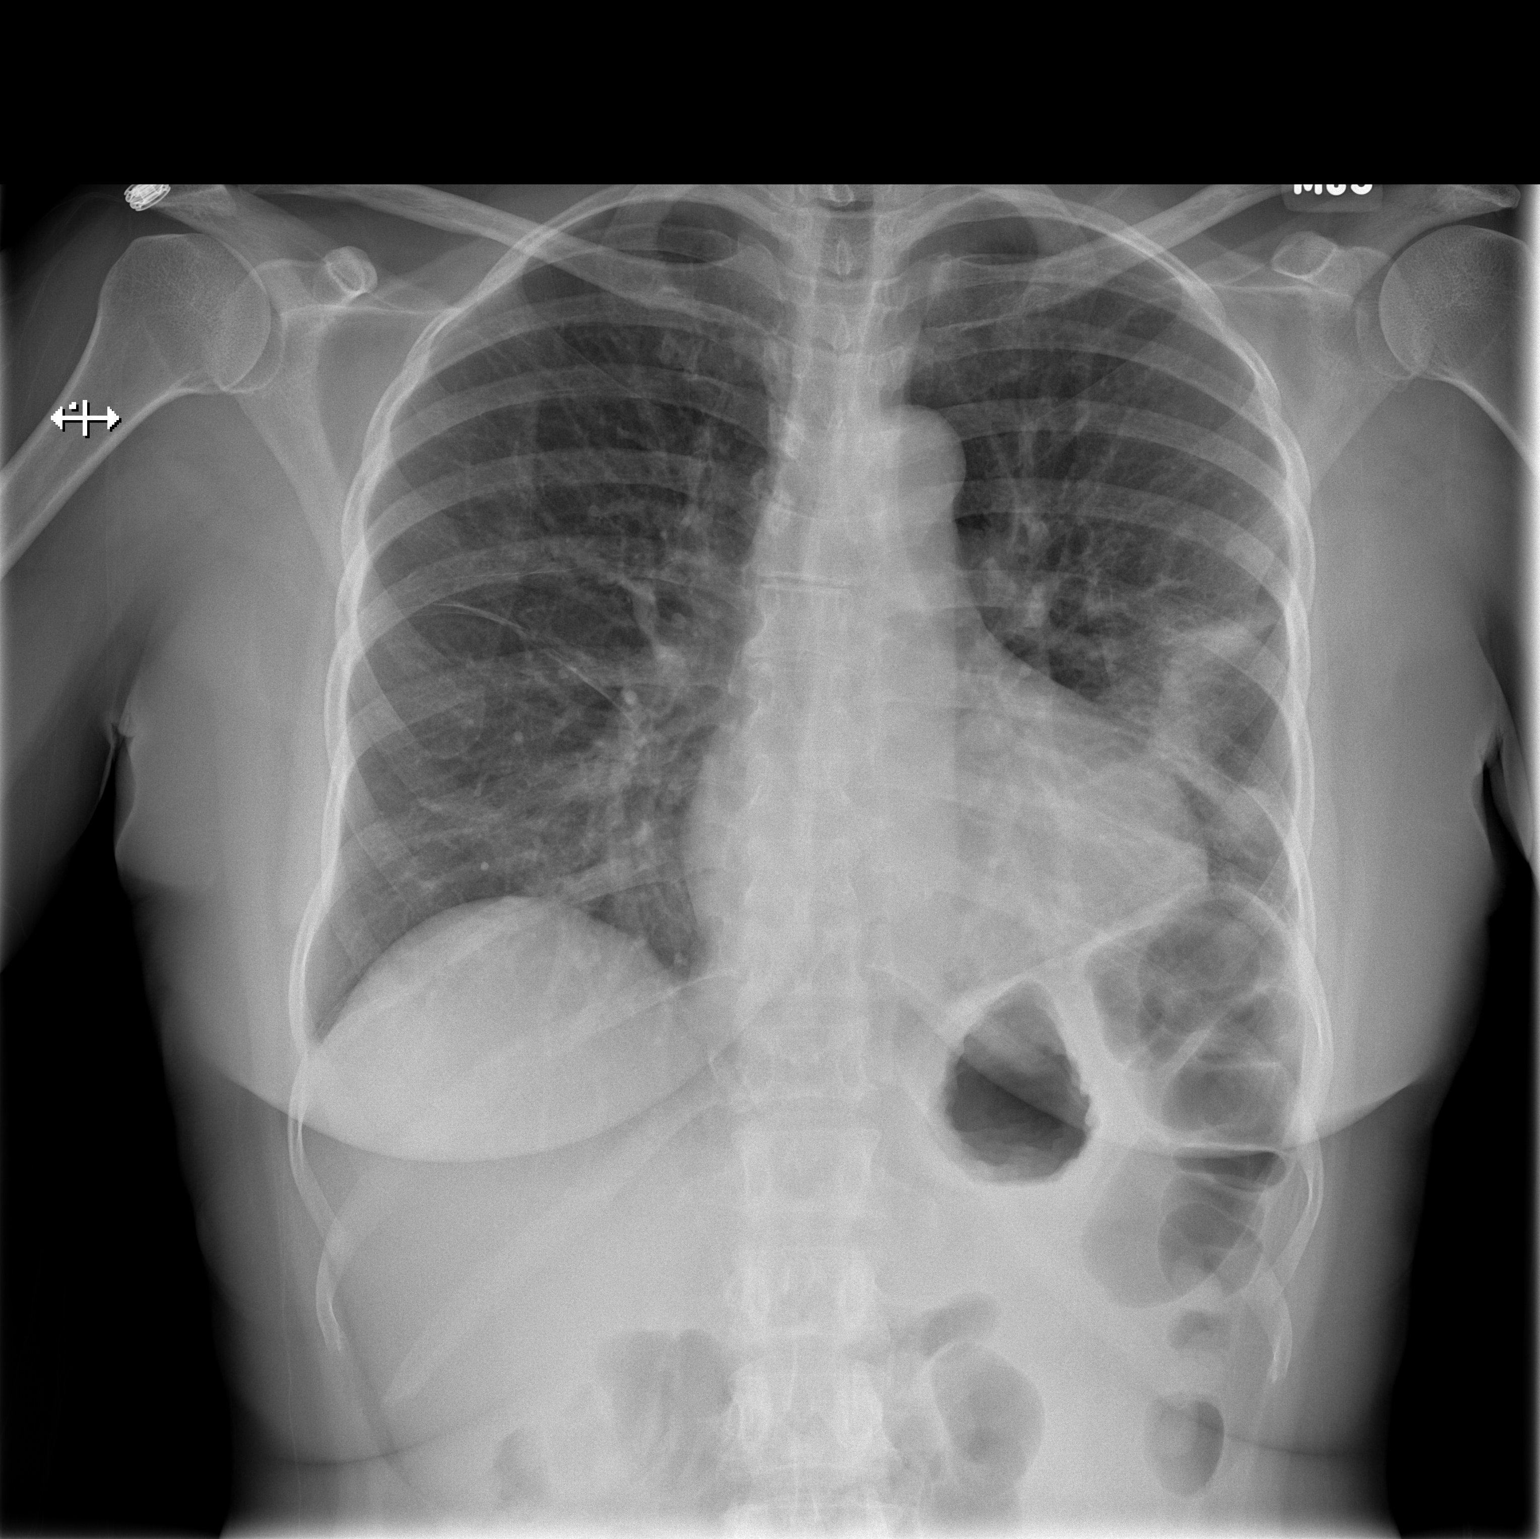

[w chest lat]
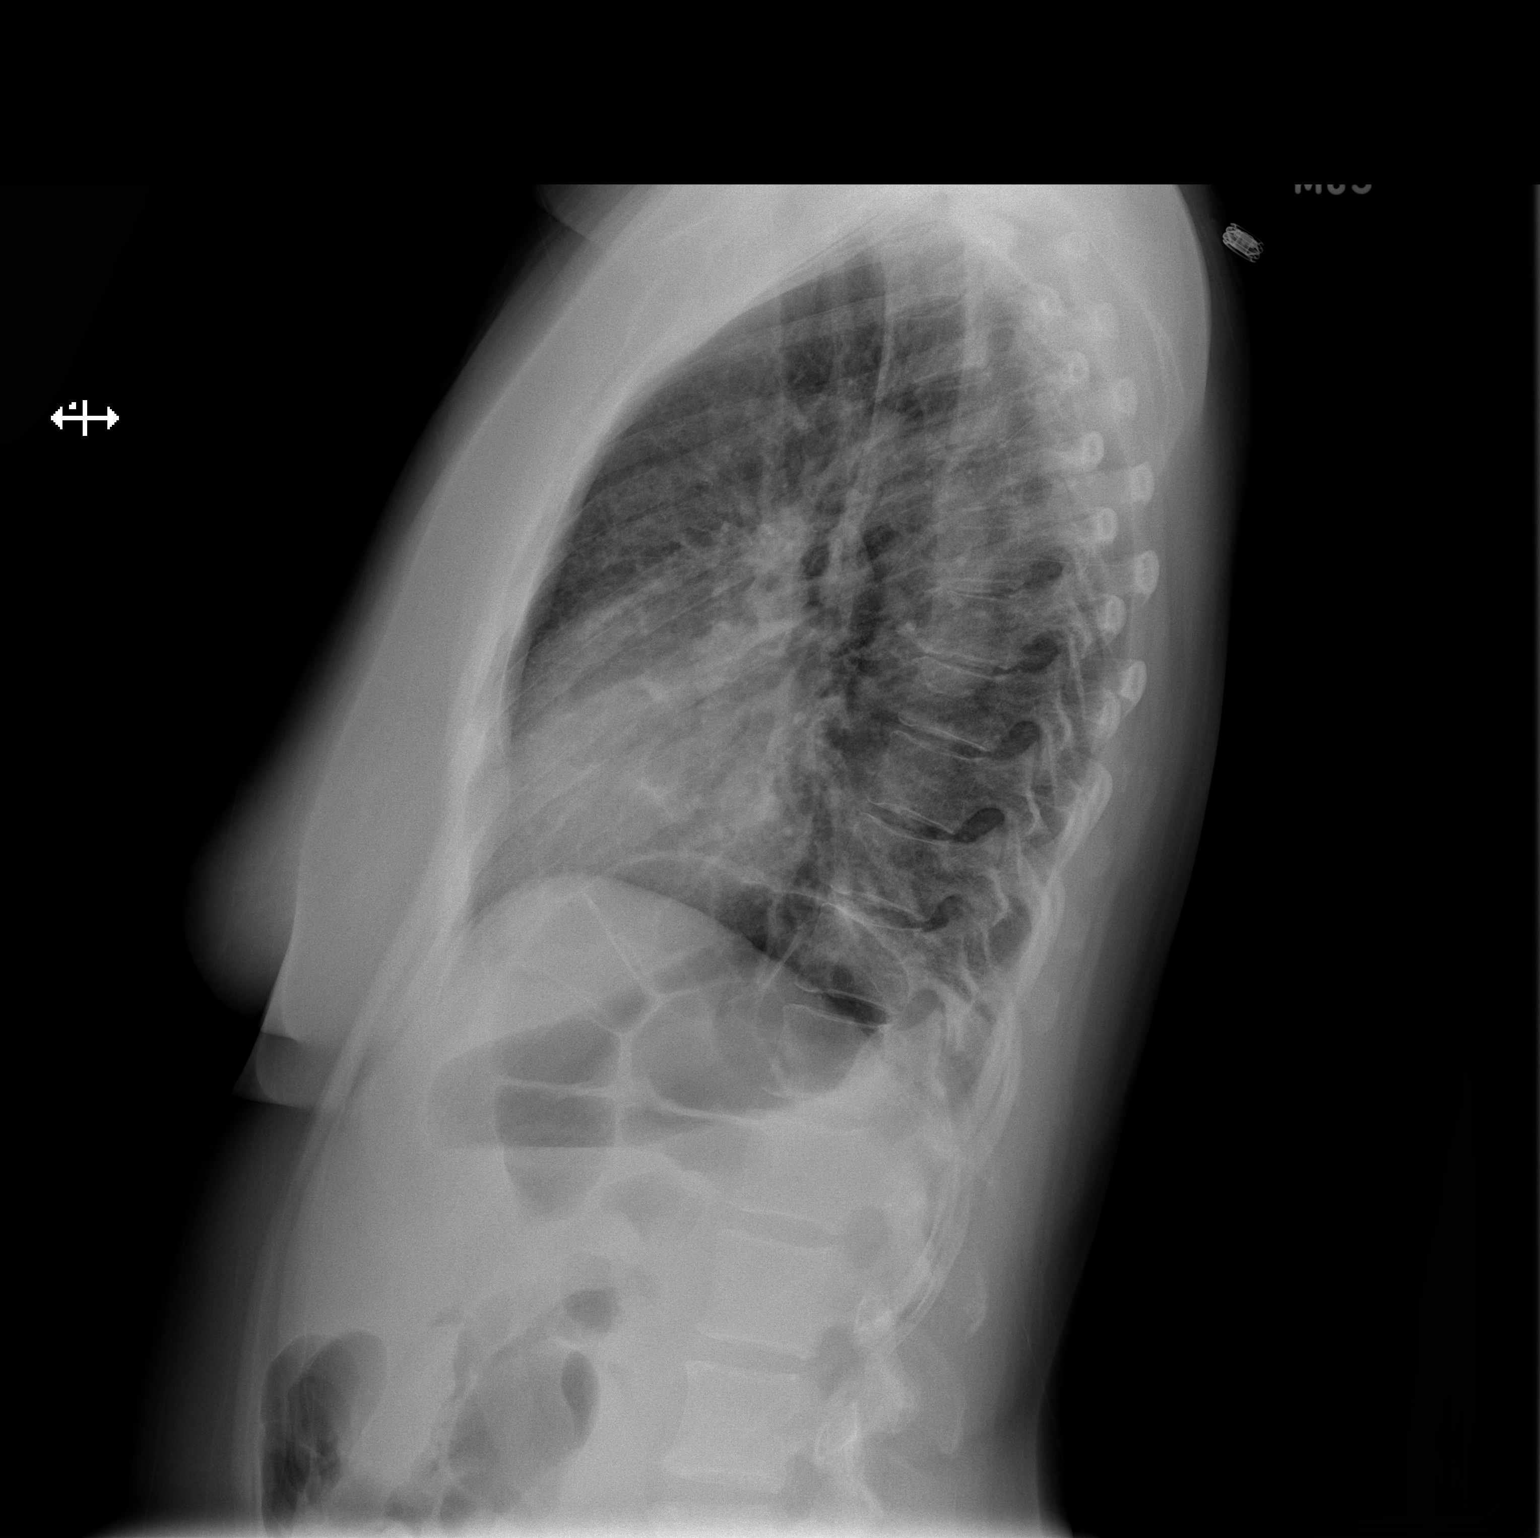

[2 of 2 positions shown; findings below may reference images not displayed]

FINDINGS: There is a new acute infiltrate in the left lung likely
predominately at the level of the lingula of but also potentially
extending into the lower lobe.  No associated pleural fluid or
edema.  Heart size and mediastinal contours are within normal
limits.  Bony thorax is unremarkable.
IMPRESSION: New left lung infiltrate, predominantly at the level of the
lingula.

## 2012-10-20 MED ORDER — ACETAMINOPHEN 500 MG PO TABS
1000.0000 mg | ORAL_TABLET | Freq: Once | ORAL | Status: AC
  Administered 2012-10-20: 1000 mg via ORAL
  Filled 2012-10-20: qty 2

## 2012-10-20 MED ORDER — AZITHROMYCIN 250 MG PO TABS: 250.0000 mg | ORAL_TABLET | Freq: Every day | ORAL | Status: DC

## 2012-10-20 MED ORDER — ACETAMINOPHEN 500 MG PO TABS: 500.0000 mg | ORAL_TABLET | Freq: Four times a day (QID) | ORAL | Status: DC | PRN

## 2012-10-20 MED ORDER — IBUPROFEN 400 MG PO TABS
800.0000 mg | ORAL_TABLET | Freq: Once | ORAL | Status: AC
  Administered 2012-10-20: 800 mg via ORAL
  Filled 2012-10-20: qty 2

## 2012-10-20 MED ORDER — AZITHROMYCIN 250 MG PO TABS
500.0000 mg | ORAL_TABLET | Freq: Once | ORAL | Status: AC
  Administered 2012-10-20: 500 mg via ORAL
  Filled 2012-10-20: qty 2

## 2012-10-20 NOTE — Discharge Instructions (Signed)
Fever, Adult A fever is a temperature of 100.4 F (38 C) or above.  HOME CARE  Take fever medicine as told by your doctor. Do not  take aspirin for fever if you are younger than 55 years of age.  If you are given antibiotic medicine, take it as told. Finish the medicine even if you start to feel better.  Rest.  Drink enough fluids to keep your pee (urine) clear or pale yellow. Do not drink alcohol.  Take a bath or shower with room temperature water. Do not use ice water or alcohol sponge baths.  Wear lightweight, loose clothes. GET HELP RIGHT AWAY IF:   You are short of breath or have trouble breathing.  You are very weak.  You are dizzy or you pass out (faint).  You are very thirsty or are making little or no urine.  You have new pain.  You throw up (vomit) or have watery poop (diarrhea).  You keep throwing up or having watery poop for more than 1 to 2 days.  You have a stiff neck or light bothers your eyes.  You have a skin rash.  You have a fever or problems (symptoms) that last for more than 2 to 3 days.  You have a fever and your problems quickly get worse.  You keep throwing up the fluids you drink.  You do not feel better after 3 days.  You have new problems. MAKE SURE YOU:   Understand these instructions.  Will watch your condition.  Will get help right away if you are not doing well or get worse. Document Released: 02/06/2008 Document Revised: 07/22/2011 Document Reviewed: 02/28/2011 [facility] Patient Information 2014 [place], [place].  Pneumonia, Adult Pneumonia is an infection of the lungs.  CAUSES Pneumonia may be caused by bacteria or a virus. Usually, these infections are caused by breathing infectious particles into the lungs (respiratory tract). SYMPTOMS   Cough.  Fever.  Chest pain.  Increased rate of breathing.  Wheezing.  Mucus production. DIAGNOSIS  If you have the common symptoms of pneumonia, your caregiver will typically  confirm the diagnosis with a chest X-ray. The X-ray will show an abnormality in the lung (pulmonary infiltrate) if you have pneumonia. Other tests of your blood, urine, or sputum may be done to find the specific cause of your pneumonia. Your caregiver may also do tests (blood gases or pulse oximetry) to see how well your lungs are working. TREATMENT  Some forms of pneumonia may be spread to other people when you cough or sneeze. You may be asked to wear a mask before and during your exam. Pneumonia that is caused by bacteria is treated with antibiotic medicine. Pneumonia that is caused by the influenza virus may be treated with an antiviral medicine. Most other viral infections must run their course. These infections will not respond to antibiotics.  PREVENTION A pneumococcal shot (vaccine) is available to prevent a common bacterial cause of pneumonia. This is usually suggested for:  People over 59 years old.  Patients on chemotherapy.  People with chronic lung problems, such as bronchitis or emphysema.  People with immune system problems. If you are over 65 or have a high risk condition, you may receive the pneumococcal vaccine if you have not received it before. In some countries, a routine influenza vaccine is also recommended. This vaccine can help prevent some cases of pneumonia.You may be offered the influenza vaccine as part of your care. If you smoke, it is time to quit.  You may receive instructions on how to stop smoking. Your caregiver can provide medicines and counseling to help you quit. HOME CARE INSTRUCTIONS   Cough suppressants may be used if you are losing too much rest. However, coughing protects you by clearing your lungs. You should avoid using cough suppressants if you can.  Your caregiver may have prescribed medicine if he or she thinks your pneumonia is caused by a bacteria or influenza. Finish your medicine even if you start to feel better.  Your caregiver may also  prescribe an expectorant. This loosens the mucus to be coughed up.  Only take over-the-counter or prescription medicines for pain, discomfort, or fever as directed by your caregiver.  Do not smoke. Smoking is a common cause of bronchitis and can contribute to pneumonia. If you are a smoker and continue to smoke, your cough may last several weeks after your pneumonia has cleared.  A cold steam vaporizer or humidifier in your room or home may help loosen mucus.  Coughing is often worse at night. Sleeping in a semi-upright position in a recliner or using a couple pillows under your head will help with this.  Get rest as you feel it is needed. Your body will usually let you know when you need to rest. SEEK IMMEDIATE MEDICAL CARE IF:   Your illness becomes worse. This is especially true if you are elderly or weakened from any other disease.  You cannot control your cough with suppressants and are losing sleep.  You begin coughing up blood.  You develop pain which is getting worse or is uncontrolled with medicines.  You have a fever.  Any of the symptoms which initially brought you in for treatment are getting worse rather than better.  You develop shortness of breath or chest pain. MAKE SURE YOU:   Understand these instructions.  Will watch your condition.  Will get help right away if you are not doing well or get worse. Document Released: 04/29/2005 Document Revised: 07/22/2011 Document Reviewed: 07/19/2010 [facility] Patient Information 2014 [place], [place].

## 2012-10-20 NOTE — ED Notes (Signed)
Reported Vitals to [place], [place] , no orders received.

## 2012-10-20 NOTE — ED Provider Notes (Signed)
History    This chart was scribed for Junius Finner (PA) non-physician practitioner working with Gwyneth Sprout, MD by Sofie Rower, ED Scribe. This patient was seen in room TR05C/TR05C and the patient's care was started at 4:20PM.   CSN: [id number]  Arrival date & time 10/20/12  1408   First MD Initiated Contact with Patient 10/20/12 1620      Chief Complaint  Patient presents with  . Nasal Congestion  . Fever    (Consider location/radiation/quality/duration/timing/severity/associated sxs/prior treatment) The history is provided by the patient. No language interpreter was used.    Jacqueline Fox is a 57 y.o. female , with no known medical hx, who presents to the Emergency Department complaining of sudden, progressively worsening, fever (Subjective PTA, 100.6, taken at [facility] today, 10/20/12) , onset two days ago (10/18/12).  Associated symptoms include dark colored urine, sore throat, difficulty swallowing, and productive cough. The pt reports she began to experience her seasonal allergies over the weekend, which she believes, has progressively spread to her chest and throat, giving rise to her current state of vitality. The pt has taken benadryl, which she informs, does not provide relief of the fever.   The pt denies nausea, vomiting, abdominal pain, difficulty breathing and chest pain. Furthermore, the pt denies exposure to any sick contacts and any recent travel.   The pt is a current everyday smoker, in addition to drinking alcohol.   Pt does not have a PCP.    No past medical history on file.  No past surgical history on file.  No family history on file.  History  Substance Use Topics  . Smoking status: Current Every Day Smoker    Types: Cigarettes  . Smokeless tobacco: Not on file  . Alcohol Use: Yes    OB History   Grav Para Term Preterm Abortions TAB SAB Ect Mult Living                  Review of Systems  Constitutional: Positive for fever.  HENT: Positive for  sore throat and trouble swallowing.   Respiratory: Positive for cough. Negative for shortness of breath.   Cardiovascular: Negative for chest pain.  Gastrointestinal: Negative for nausea, vomiting and abdominal pain.  All other systems reviewed and are negative.    Allergies  Review of patient's allergies indicates no known allergies.  Home Medications   Current Outpatient Rx  Name  Route  Sig  Dispense  Refill  . diphenhydrAMINE (BENADRYL) 25 mg capsule   Oral   Take 25 mg by mouth every 6 (six) hours as needed for itching or allergies.         Marland Kitchen acetaminophen (TYLENOL) 500 MG tablet   Oral   Take 1 tablet (500 mg total) by mouth every 6 (six) hours as needed for pain.   30 tablet   0   . azithromycin (ZITHROMAX) 250 MG tablet   Oral   Take 1 tablet (250 mg total) by mouth daily. Take first 2 tablets together, then 1 every day until finished.   6 tablet   0     BP 167/96  Temp(Src) 100.6 F (38.1 C) (Oral)  Resp 18  SpO2 95%  Physical Exam  Nursing note and vitals reviewed. Constitutional: She is oriented to person, place, and time. She appears well-developed and well-nourished. No distress.  Pt wearing face mask, appears fatigued   HENT:  Head: Normocephalic and atraumatic.  Right Ear: Tympanic membrane and external ear normal.  Left Ear: Tympanic membrane and external ear normal.  Mouth/Throat: Posterior oropharyngeal edema (Mild) and posterior oropharyngeal erythema (Mild) present. No oropharyngeal exudate.  Eyes: Conjunctivae and EOM are normal. No scleral icterus.  Neck: Normal range of motion. Neck supple. No tracheal deviation present.  No nuchal rigidity or meningeal signs   Cardiovascular: Normal rate, regular rhythm and normal heart sounds.  Exam reveals no gallop and no friction rub.   No murmur heard. Pulmonary/Chest: Effort normal. No respiratory distress. She has no wheezes. She has rhonchi in the left middle field and the left lower field. She  has no rales. She exhibits no tenderness.  Crackles and rhochi in the left lwoer lobe. No respiratory distress.  Able to speak in full sentences.   Abdominal: Soft. Bowel sounds are normal. She exhibits no distension and no mass. There is no tenderness. There is no rebound and no guarding.  Musculoskeletal: Normal range of motion.  Neurological: She is alert and oriented to person, place, and time.  Skin: Skin is warm and dry.  Psychiatric: She has a normal mood and affect. Her behavior is normal.    ED Course  Procedures (including critical care time)  DIAGNOSTIC STUDIES: Oxygen Saturation is 95% on room air, normal by my interpretation.    COORDINATION OF CARE:   4:36 PM- Treatment plan concerning chest x-ray discussed with patient. Pt agrees with treatment.    5:54 PM- Recheck. Treatment plan concerning laboratory and radiology results discussed with patient. Pt agrees with treatment.  6:27 PM- Recheck. Treatment plan discussed with patient. Pt agrees with treatment.     Results for orders placed during the hospital encounter of 10/20/12  URINALYSIS, ROUTINE W REFLEX MICROSCOPIC      Result Value Range   Color, Urine AMBER (*) YELLOW   APPearance CLOUDY (*) CLEAR   Specific Gravity, Urine 1.017  1.005 - 1.030   pH 6.0  5.0 - 8.0   Glucose, UA NEGATIVE  NEGATIVE mg/dL   Hgb urine dipstick LARGE (*) NEGATIVE   Bilirubin Urine SMALL (*) NEGATIVE   Ketones, ur >80 (*) NEGATIVE mg/dL   Protein, ur 161 (*) NEGATIVE mg/dL   Urobilinogen, UA 1.0  0.0 - 1.0 mg/dL   Nitrite NEGATIVE  NEGATIVE   Leukocytes, UA TRACE (*) NEGATIVE  URINE MICROSCOPIC-ADD ON      Result Value Range   Squamous Epithelial / LPF MANY (*) RARE   WBC, UA 0-2  <3 WBC/hpf   RBC / HPF 3-6  <3 RBC/hpf   Bacteria, UA RARE  RARE   Urine-Other MUCOUS PRESENT     Dg Chest 2 View  10/20/2012   *RADIOLOGY REPORT*  Clinical Data: Shortness of breath and cough.  CHEST - 2 VIEW  Comparison: 04/28/2006  Findings:  There is a new acute infiltrate in the left lung likely predominately at the level of the lingula of but also potentially extending into the lower lobe.  No associated pleural fluid or edema.  Heart size and mediastinal contours are within normal limits.  Bony thorax is unremarkable.  IMPRESSION: New left lung infiltrate, predominantly at the level of the lingula.   Original Report Authenticated By: Irish Lack, M.D.         1. Left lower lobe pneumonia   2. Fever       MDM  Pt has evidence of early left middle, to lower lobe pneumonia. Pt was febrile upon arrival was given acetaminophen, vitals were rechecked prior to initial discharge: pt found  to be febrile at 101.1 and tachycardic at 122.  Pt was treated in ED with 1st does of azithromycin and ibuprofen for return elevated fever.  Pt was observed in ED, vitals did improve.  Pt is a smoker but otherwise healthy. Pt discussed with Dr. Anitra Lauth who agrees pt is able to be discharged home with outpatient treatment.    Tx in ED: azithromycin given in ED.  Rx: azithromycin and acetaminophen. F/u with PCP, [place] [facility] info provided. Return precautions given. Pt verbalized understanding and agreement with tx plan. Vitals: unremarkable. Discharged in stable condition.  Discussed pt with attending during ED encounter.  I personally performed the services described in this documentation, which was scribed in my presence. The recorded information has been reviewed and is accurate.   Junius Finner, PA-C 10/21/12 0004

## 2012-10-20 NOTE — ED Notes (Addendum)
Pt presents to department for evaluation of sinus/chest congestion, dry cough and fever. Ongoing x1 week. Respirations unlabored. Pt is conscious alert and oriented x4.

## 2012-10-21 NOTE — ED Provider Notes (Signed)
Medical screening examination/treatment/procedure(s) were performed by non-physician practitioner and as supervising physician I was immediately available for consultation/collaboration.   Gwyneth Sprout, MD 10/21/12 0006

## 2014-04-15 ENCOUNTER — Ambulatory Visit: Payer: Self-pay | Attending: Internal Medicine

## 2015-01-28 ENCOUNTER — Emergency Department (HOSPITAL_COMMUNITY)
Admission: EM | Admit: 2015-01-28 | Discharge: 2015-01-28 | Disposition: A | Discharge status: Home / Self Care | Payer: Self-pay | Attending: Emergency Medicine | Admitting: Emergency Medicine

## 2015-01-28 ENCOUNTER — Emergency Department (HOSPITAL_COMMUNITY): Payer: Self-pay

## 2015-01-28 ENCOUNTER — Encounter (HOSPITAL_COMMUNITY): Payer: Self-pay | Admitting: Emergency Medicine

## 2015-01-28 DIAGNOSIS — Z720 Tobacco use: Secondary | ICD-10-CM | POA: Insufficient documentation

## 2015-01-28 DIAGNOSIS — H9201 Otalgia, right ear: Secondary | ICD-10-CM | POA: Insufficient documentation

## 2015-01-28 DIAGNOSIS — J029 Acute pharyngitis, unspecified: Secondary | ICD-10-CM | POA: Insufficient documentation

## 2015-01-28 DIAGNOSIS — R630 Anorexia: Secondary | ICD-10-CM | POA: Insufficient documentation

## 2015-01-28 DIAGNOSIS — R51 Headache: Secondary | ICD-10-CM | POA: Insufficient documentation

## 2015-01-28 LAB — CBC WITH DIFFERENTIAL/PLATELET
BASOS ABS: 0 10*3/uL (ref 0.0–0.1)
Basophils Relative: 0 %
EOS ABS: 0 10*3/uL (ref 0.0–0.7)
Eosinophils Relative: 0 %
HCT: 37.4 % (ref 36.0–46.0)
HEMOGLOBIN: 12.4 g/dL (ref 12.0–15.0)
LYMPHS ABS: 1.7 10*3/uL (ref 0.7–4.0)
LYMPHS PCT: 14 %
MCH: 30.6 pg (ref 26.0–34.0)
MCHC: 33.2 g/dL (ref 30.0–36.0)
MCV: 92.3 fL (ref 78.0–100.0)
Monocytes Absolute: 1.2 10*3/uL — ABNORMAL HIGH (ref 0.1–1.0)
Monocytes Relative: 10 %
NEUTROS PCT: 76 %
Neutro Abs: 9.1 10*3/uL — ABNORMAL HIGH (ref 1.7–7.7)
Platelets: 308 10*3/uL (ref 150–400)
RBC: 4.05 MIL/uL (ref 3.87–5.11)
RDW: 13 % (ref 11.5–15.5)
WBC: 11.9 10*3/uL — AB (ref 4.0–10.5)

## 2015-01-28 LAB — BASIC METABOLIC PANEL
ANION GAP: 11 (ref 5–15)
BUN: 6 mg/dL (ref 6–20)
CHLORIDE: 101 mmol/L (ref 101–111)
CO2: 27 mmol/L (ref 22–32)
Calcium: 9.6 mg/dL (ref 8.9–10.3)
Creatinine, Ser: 0.68 mg/dL (ref 0.44–1.00)
GFR calc Af Amer: >60 mL/min (ref >60)
GFR calc non Af Amer: >60 mL/min (ref >60)
Glucose, Bld: 106 mg/dL — ABNORMAL HIGH (ref 65–99)
POTASSIUM: 3.6 mmol/L (ref 3.5–5.1)
SODIUM: 139 mmol/L (ref 135–145)

## 2015-01-28 LAB — RAPID STREP SCREEN (MED CTR MEBANE ONLY): STREPTOCOCCUS, GROUP A SCREEN (DIRECT): NEGATIVE

## 2015-01-28 IMAGING — CT CT NECK W/ CM
5 of 6 series · 15 of 33 positions shown, 17 images · IV contrast (APPLIED)
Comparison: None.

CLINICAL DATA: Painful swallowing, sore throat for one week; dr.
TADIC TARAILO checked infiltrated contrast and the ED was notified; it was
determined that no consult was needed.

EXAM:
CT NECK WITH CONTRAST
TECHNIQUE: Multidetector CT imaging of the neck was performed using the
standard protocol following the bolus administration of intravenous
contrast.
CONTRAST:  75mL OMNIPAQUE IOHEXOL 300 MG/ML  SOLN

[Series 2: neck 2.0 i31s 3 · axial · 0.47mm/px · z∈[-256,-156]mm · 3 of 102 slices shown, 4 images (1 of 2)]
[im 26/102  soft-tissue]
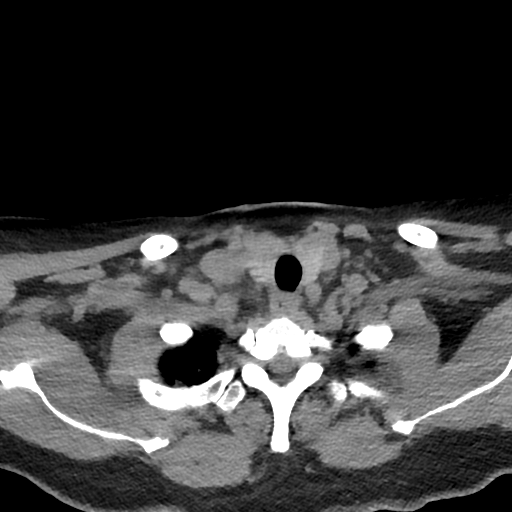
[im 26/102  bone]
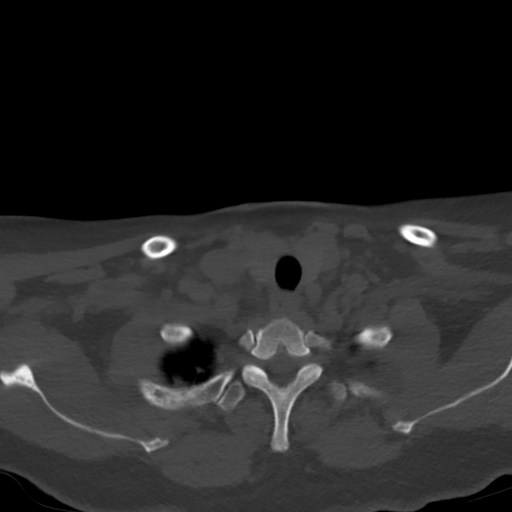
[im 51/102  bone]
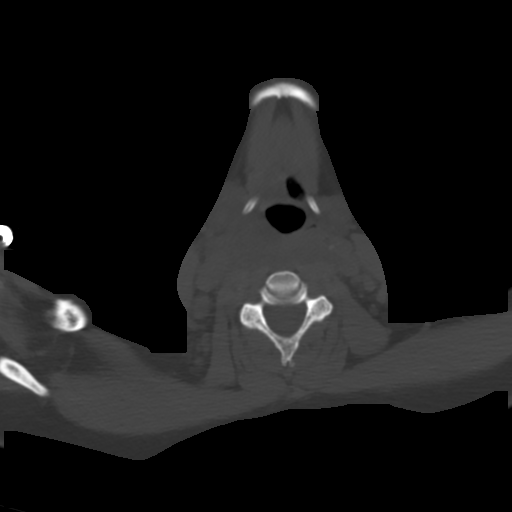
[im 76/102  bone]
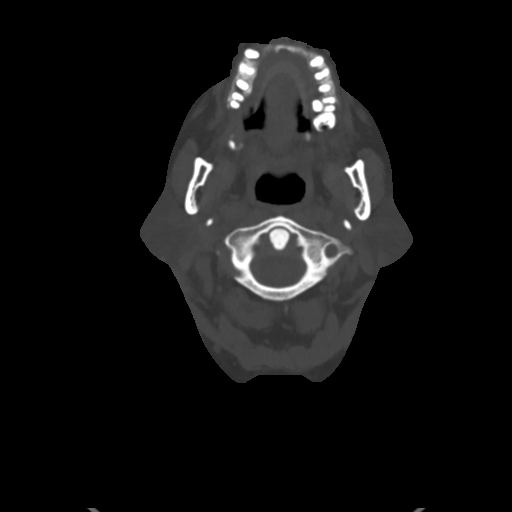

[Series 5: neck 2.0 i31s 3 · axial · 0.52mm/px · z∈[-234,-166]mm · 2 of 102 slices shown (2 of 2)]
[im 34/102  bone]
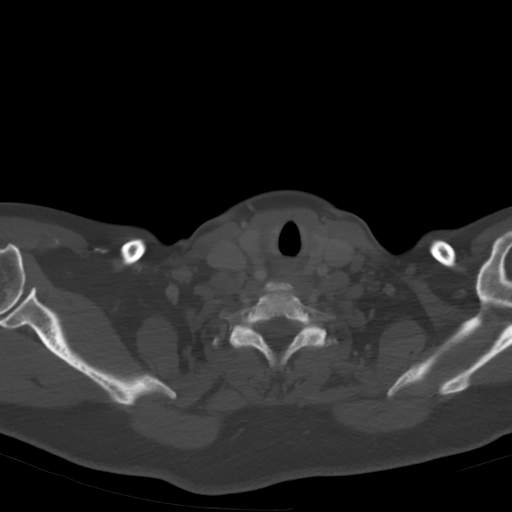
[im 68/102  bone]
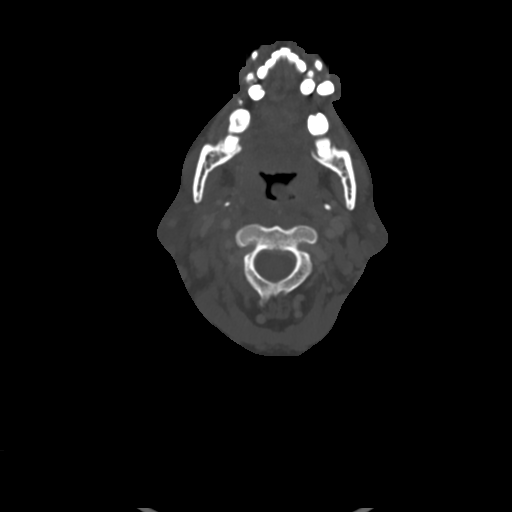

[Series 8: coronal st · coronal · 0.50mm/px · 3 of 86 slices shown]
[im 20/86  bone]
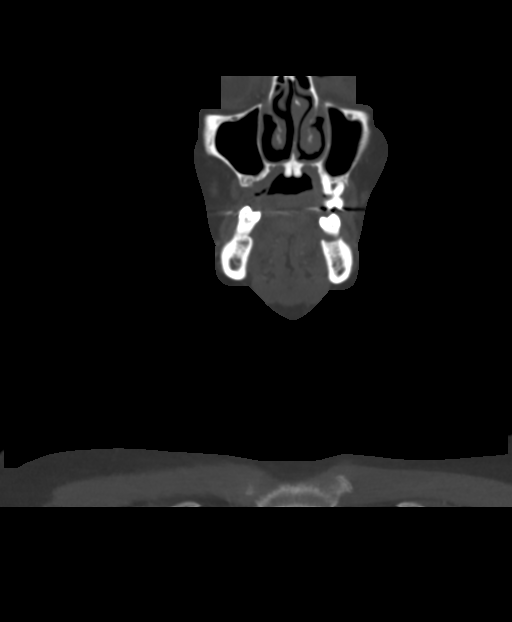
[im 36/86  bone]
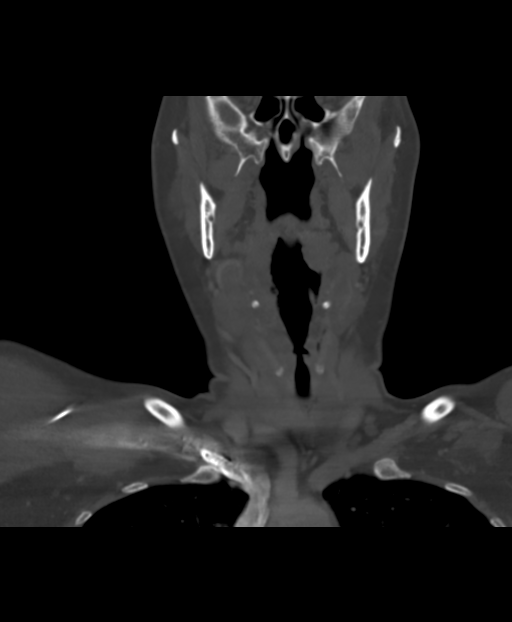
[im 51/86  bone]
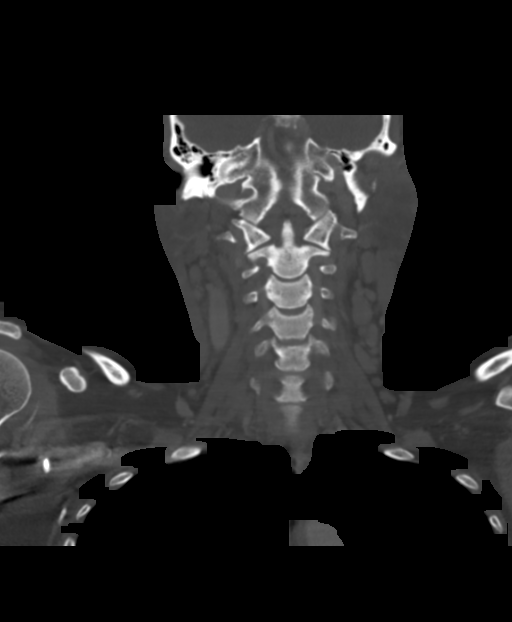

[Series 9: sagittal st · sagittal · 0.50mm/px · 5 of 118 slices shown, 6 images]
[im 40/118  bone]
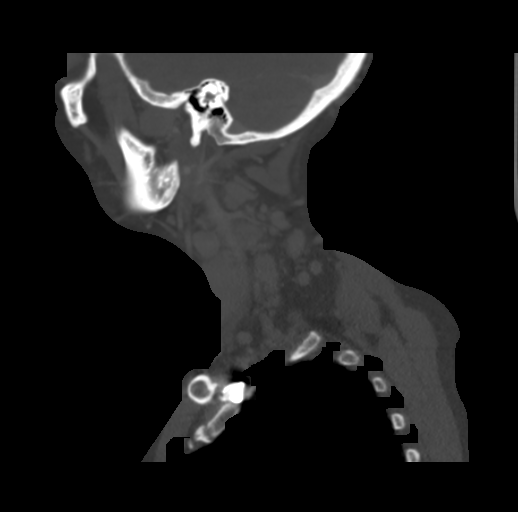
[im 49/118  bone]
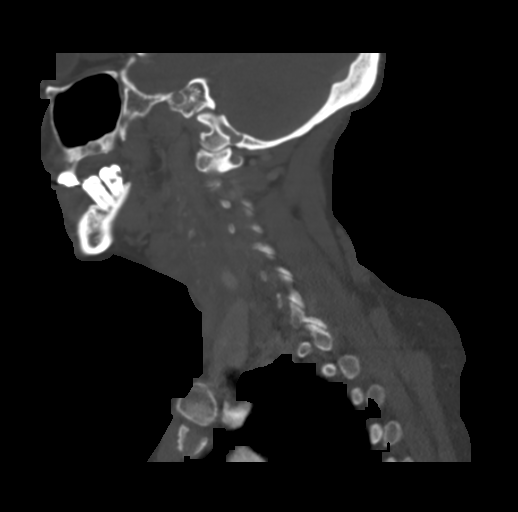
[im 59/118  soft-tissue]
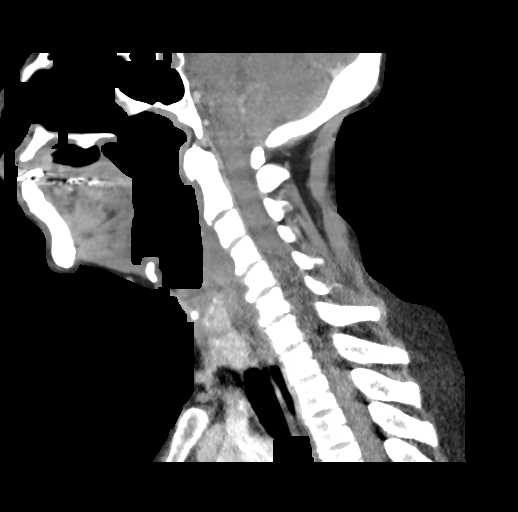
[im 59/118  bone]
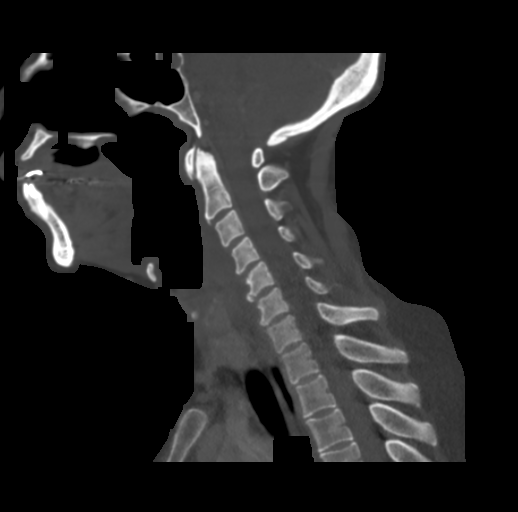
[im 69/118  bone]
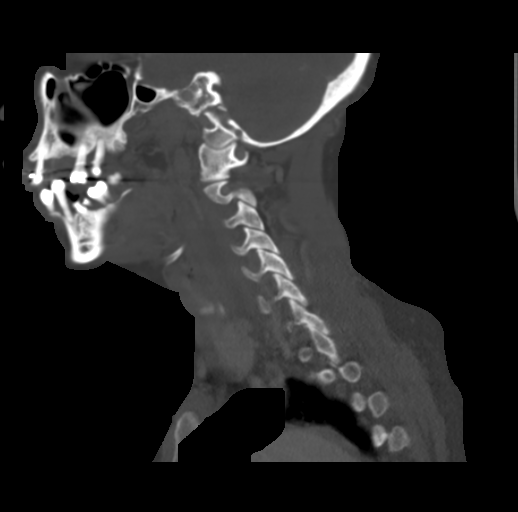
[im 79/118  bone]
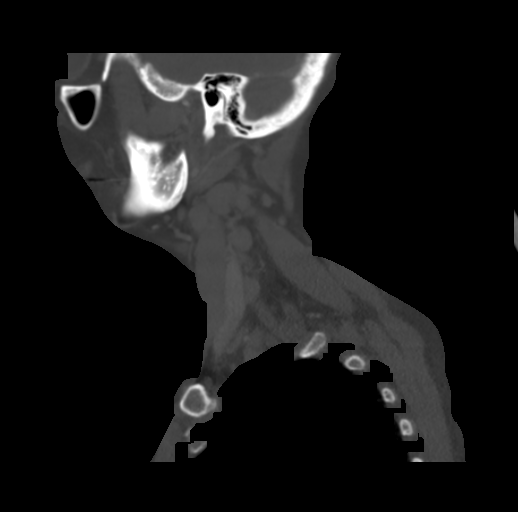

[Series 10: orthogonal st · axial · 0.48mm/px · z∈[-264,-209]mm · 2 of 88 slices shown]
[im 30/88  bone]
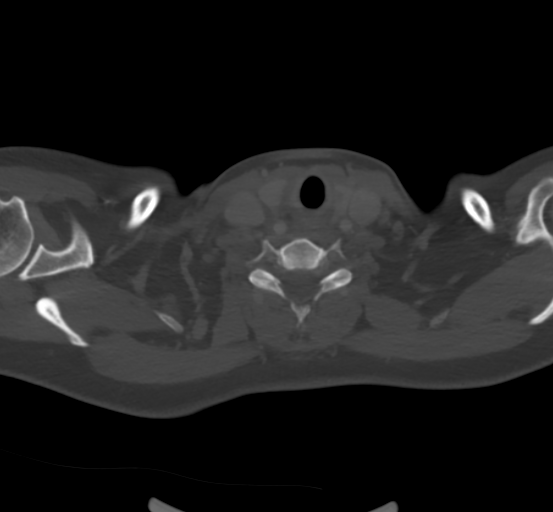
[im 59/88  bone]
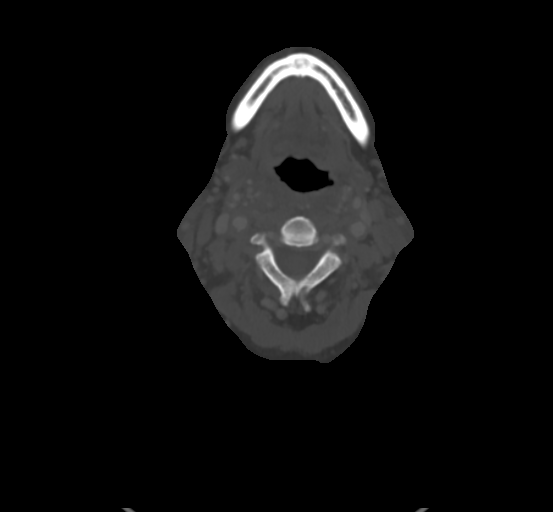

[15 of 33 positions shown; findings below may reference images not displayed]

FINDINGS: Pharynx and larynx: Less appears normal. The pharynx demonstrates
diffuse soft tissue prominence circumferentially. In particular
there is prevertebral soft tissue thickening and low attenuation
from the level of the angle of the mandible down to the level of the
larynx.Prominent adenoid soft tissue. No evidence of peri- or
retropharyngeal abscess. No significant airway narrowing.

Salivary glands: Normal

Thyroid: Normal

Lymph nodes: There is diffuse presumably reactive lymphadenopathy in
the carotid chain posterior triangles and jugulodigastric regions
bilaterally. There is also submandibular lymphadenopathy. The
largest lymph node is a jugulodigastric lymph node on the right
measuring 10 mm.

Vascular: Mild bilateral carotid calcification

Limited intracranial: Negative

Visualized orbits: Negative

Mastoids and visualized paranasal sinuses: Negative

Skeleton: Negative

Upper chest: Negative
IMPRESSION: Diffuse pharyngitis with reactive adenopathy and prevertebral soft
tissue edematous change, without evidence of abscess.

## 2015-01-28 MED ORDER — PENICILLIN G BENZATHINE 1200000 UNIT/2ML IM SUSP
1.2000 10*6.[IU] | Freq: Once | INTRAMUSCULAR | Status: AC
  Administered 2015-01-28: 1.2 10*6.[IU] via INTRAMUSCULAR
  Filled 2015-01-28: qty 2

## 2015-01-28 MED ORDER — HYDROCODONE-ACETAMINOPHEN 5-325 MG PO TABS: ORAL_TABLET | ORAL | Status: DC

## 2015-01-28 MED ORDER — IOHEXOL 300 MG/ML  SOLN
75.0000 mL | Freq: Once | INTRAMUSCULAR | Status: AC | PRN
  Administered 2015-01-28: 75 mL via INTRAVENOUS

## 2015-01-28 MED ORDER — DEXAMETHASONE SODIUM PHOSPHATE 10 MG/ML IJ SOLN
10.0000 mg | Freq: Once | INTRAMUSCULAR | Status: AC
  Administered 2015-01-28: 10 mg via INTRAVENOUS
  Filled 2015-01-28: qty 1

## 2015-01-28 NOTE — ED Notes (Signed)
Pt reviewed post procedure instructions for IV dye infiltrated and signed .

## 2015-01-28 NOTE — Discharge Instructions (Signed)
Please read and follow all provided instructions.  Your diagnoses today include:  1. Pharyngitis    Tests performed today include:  Strep test: was negative for strep throat  Strep culture: you will be notified if this comes back positive  Blood counts and electrolytes - high white blood cells due to infection  CT of your neck - shows that you have infection in your throat without any abscess or serious airway swelling  Vital signs. See below for your results today.   Medications prescribed:   Vicodin (hydrocodone/acetaminophen) - narcotic pain medication  DO NOT drive or perform any activities that require you to be awake and alert because this medicine can make you drowsy. BE VERY CAREFUL not to take multiple medicines containing Tylenol (also called acetaminophen). Doing so can lead to an overdose which can damage your liver and cause liver failure and possibly death.  Home care instructions:  Please read the educational materials provided and follow any instructions contained in this packet.  Follow-up instructions: Please follow-up with your primary care provider as needed for further evaluation of your symptoms.  Return instructions:   Please return to the Emergency Department if you experience worsening symptoms.   Return if you have worsening problems swallowing, your neck becomes swollen, you cannot swallow your saliva or your voice becomes muffled.   Return with high persistent fever, persistent vomiting, or if you have trouble breathing.   Please return if you have any other emergent concerns.  Additional Information:  Your vital signs today were: BP 146/86 mmHg   Pulse 100   Temp(Src) 99.5 F (37.5 C) (Oral)   Resp 18   Ht  (1.626 m)   Wt 155 lb (70.308 kg)   BMI 26.59 kg/m2   SpO2 100% If your blood pressure (BP) was elevated above 135/85 this visit, please have this repeated by your doctor within one month. --------------

## 2015-01-28 NOTE — ED Provider Notes (Signed)
CSN: [id number]     Arrival date & time 01/28/15  4098 History  This chart was scribed for Rhea Bleacher, working with Marily Memos, MD by Chestine Spore, ED Scribe. The patient was seen in room TR11C/TR11C at 10:22 AM.    Chief Complaint  Patient presents with  . Sore Throat    The history is provided by the patient. No language interpreter was used.    HPI Comments: Jacqueline Fox is a 59 y.o. female who presents to the Emergency Department complaining of worsening sore throat onset 1 week. She states that she is having associated symptoms of painful swallowing that radiates up the right side of her face, HA, right ear pain, appetite change due to painful swallowing and voice change. She states that she has tried peppermint without medications due to the painful swallowing with no relief for her symptoms. She denies rhinorrhea, neck pain, trismus, SOB, and any other symptoms. Pt has not had a general physical in awhile.   History reviewed. No pertinent past medical history. History reviewed. No pertinent past surgical history. No family history on file. Social History  Substance Use Topics  . Smoking status: Current Every Day Smoker    Types: Cigarettes  . Smokeless tobacco: None  . Alcohol Use: Yes   OB History    No data available     Review of Systems  Constitutional: Positive for fever (low grade) and appetite change (due to painful swallowing).  HENT: Positive for ear pain (right), sore throat (with painful swallowing), trouble swallowing and voice change. Negative for rhinorrhea.   Eyes: Negative for redness.  Respiratory: Negative for cough and shortness of breath.   Cardiovascular: Negative for chest pain.  Gastrointestinal: Negative for nausea, vomiting, abdominal pain and diarrhea.  Genitourinary: Negative for dysuria.  Musculoskeletal: Negative for myalgias and neck pain.  Skin: Negative for rash.  Neurological: Positive for headaches.    Allergies  Review of  patient's allergies indicates no known allergies.  Home Medications   Prior to Admission medications   Medication Sig Start Date End Date Taking? Authorizing [place]  acetaminophen (TYLENOL) 500 MG tablet Take 1 tablet (500 mg total) by mouth every 6 (six) hours as needed for pain. 10/20/12   Junius Finner, PA-C  azithromycin (ZITHROMAX) 250 MG tablet Take 1 tablet (250 mg total) by mouth daily. Take first 2 tablets together, then 1 every day until finished. 10/20/12   Junius Finner, PA-C  diphenhydrAMINE (BENADRYL) 25 mg capsule Take 25 mg by mouth every 6 (six) hours as needed for itching or allergies.    Historical Demecia Northway, MD   BP 146/86 mmHg  Pulse 100  Temp(Src) 99.5 F (37.5 C) (Oral)  Resp 18  Ht  (1.626 m)  Wt 155 lb (70.308 kg)  BMI 26.59 kg/m2  SpO2 100%   Physical Exam  Constitutional: She appears well-developed and well-nourished.  HENT:  Head: Normocephalic and atraumatic.  Right Ear: Tympanic membrane, external ear and ear canal normal.  Left Ear: Tympanic membrane and ear canal normal.  Nose: Nose normal.  Mouth/Throat: Mucous membranes are normal. No trismus in the jaw. No uvula swelling. Posterior oropharyngeal erythema present. No oropharyngeal exudate, posterior oropharyngeal edema or tonsillar abscesses.  Eyes: Conjunctivae are normal. Right eye exhibits no discharge. Left eye exhibits no discharge.  Neck: Normal range of motion. Neck supple. Tracheal tenderness present. Erythema present.    Patient with hoarse voice, reluctant to talk  Cardiovascular: Normal rate, regular rhythm and normal heart  sounds.   Pulmonary/Chest: Effort normal and breath sounds normal. No stridor.  Abdominal: Soft. There is no tenderness.  Neurological: She is alert.  Skin: Skin is warm and dry.  Psychiatric: She has a normal mood and affect.  Nursing note and vitals reviewed.   ED Course  Procedures (including critical care time) DIAGNOSTIC STUDIES: Oxygen Saturation  is 100% on RA, nl by my interpretation.    COORDINATION OF CARE: 10:30 AM Discussed treatment plan with pt at bedside which includes CT neck with contrast, CBC, strep screen and culture, and pt agreed to plan.   Labs Review Labs Reviewed  CBC WITH DIFFERENTIAL/PLATELET - Abnormal; Notable for the following:    WBC 11.9 (*)    Neutro Abs 9.1 (*)    Monocytes Absolute 1.2 (*)    All other components within normal limits  BASIC METABOLIC PANEL - Abnormal; Notable for the following:    Glucose, Bld 106 (*)    All other components within normal limits  RAPID STREP SCREEN (NOT AT [facility])  CULTURE, GROUP A STREP    Imaging Review Ct Soft Tissue Neck W Contrast  01/28/2015   CLINICAL DATA:  Painful swallowing, sore throat for one week; dr. Allena Katz checked infiltrated contrast and the ED was notified; it was determined that no consult was needed.  EXAM: CT NECK WITH CONTRAST  TECHNIQUE: Multidetector CT imaging of the neck was performed using the standard protocol following the bolus administration of intravenous contrast.  CONTRAST:  75mL OMNIPAQUE IOHEXOL 300 MG/ML  SOLN  COMPARISON:  None.  FINDINGS: Pharynx and larynx: Less appears normal. The pharynx demonstrates diffuse soft tissue prominence circumferentially. In particular there is prevertebral soft tissue thickening and low attenuation from the level of the angle of the mandible down to the level of the larynx.Prominent adenoid soft tissue. No evidence of peri- or retropharyngeal abscess. No significant airway narrowing.  Salivary glands: Normal  Thyroid: Normal  Lymph nodes: There is diffuse presumably reactive lymphadenopathy in the carotid chain posterior triangles and jugulodigastric regions bilaterally. There is also submandibular lymphadenopathy. The largest lymph node is a jugulodigastric lymph node on the right measuring 10 mm.  Vascular: Mild bilateral carotid calcification  Limited intracranial: Negative  Visualized orbits: Negative   Mastoids and visualized paranasal sinuses: Negative  Skeleton: Negative  Upper chest: Negative  IMPRESSION: Diffuse pharyngitis with reactive adenopathy and prevertebral soft tissue edematous change, without evidence of abscess.   Electronically Signed   By: Esperanza Heir M.D.   On: 01/28/2015 13:35   I have personally reviewed and evaluated these images and lab results as part of my medical decision-making.   EKG Interpretation None       Vital signs reviewed and are as follows: Filed Vitals:   01/28/15 0954  BP: 146/86  Pulse: 100  Temp: 99.5 F (37.5 C)  Resp: 18   Patient informed of results. Will treat her pharyngitis with IM Bicillin and IV Decadron. Return to the emergency department with inability to swallow, high fever, severe pain, or other concerns.  Patient did have infiltration of IV contrast into her left arm during CT which was then redone. Radiologist saw patient in CT and cleared her. On exam upon her returning to the emergency department, patient has some mild tenderness and swelling of the soft tissue surrounding the left antecubital fossa. Reiterated that she should keep this area elevated and apply ice. She verbalizes understanding and agrees with plan.   MDM   Final diagnoses:  Pharyngitis  Patient with pharyngitis. CT performed due to voice change, poor handling of her secretions, lower neck pain. This showed diffuse pharyngitis without signs of abscess as well as reactive adenopathy. No concern for epiglottitis. Patient is drinking sips of water in the room. She appears well, nontoxic. Treatment as above.  I personally performed the services described in this documentation, which was scribed in my presence. The recorded information has been reviewed and is accurate.    Renne Crigler, PA-C 01/28/15 1356  Marily Memos, MD 01/29/15 0700

## 2015-01-28 NOTE — ED Notes (Signed)
Declined W/C at D/C and was escorted to lobby by RN. 

## 2015-01-28 NOTE — Progress Notes (Signed)
CM spoke with pt who confirms self pay [facility] resident with no pcp.  CM discussed and provided written information for self pay pcps, discussed the importance of pcp vs EDP services for f/u care, www.needymeds.org, www.goodrx.com, discounted pharmacies and other [employer] such as [employer] , [employer], affordable care act,  [place] med assist, financial assistance, self pay dental services, [place] med assist, DSS and  health department  Reviewed resources for [employer] self pay pcps like [facility]ul, family medicine at [facility], [facility], [facility], local urgent care centers, [facility], [facility] family practice, general medical clinics, family services of the [place], [facility] urgent care plus others, medication resources, [facility] out patient pharmacies and housing Pt voiced understanding and appreciation of resources provided   Provided [facility] contact information Pt agreed to a referral Cm completed referral Pt to be contact by [facility] clinical liaison Yvone Neu, RNCM [phone]

## 2015-01-28 NOTE — ED Notes (Signed)
Pt c/o sore throat x 1 week with painful swallowing

## 2015-01-28 NOTE — ED Notes (Signed)
PT advised per standard of treatment ice is applied to infiltrate site. Pt refuses ice to site. Pt provided with ice pack to use.

## 2015-02-01 LAB — CULTURE, GROUP A STREP: STREP A CULTURE: POSITIVE — AB

## 2015-02-02 ENCOUNTER — Telehealth (HOSPITAL_BASED_OUTPATIENT_CLINIC_OR_DEPARTMENT_OTHER): Payer: Self-pay | Admitting: Emergency Medicine

## 2015-02-02 NOTE — Telephone Encounter (Signed)
Post ED Visit - Positive Culture Follow-up  Culture report reviewed by antimicrobial stewardship pharmacist:   Celedonio Miyamoto, Pharm.D., BCPS  Georgina Pillion, Pharm.D., BCPS  Painter, [address].D., BCPS, AAHIVP  Estella Husk, Pharm.D., BCPS, AAHIVP  [place], [address].D.  Cassie Roseanne Reno, [place].D.  Positive strep culture Treated with PCN G, organism sensitive to the same and no further patient follow-up is required at this time.  Berle Mull 02/02/2015, 10:34 AM

## 2017-06-17 ENCOUNTER — Other Ambulatory Visit: Payer: Self-pay

## 2017-06-17 ENCOUNTER — Encounter (HOSPITAL_COMMUNITY): Payer: Self-pay

## 2017-06-17 DIAGNOSIS — K4090 Unilateral inguinal hernia, without obstruction or gangrene, not specified as recurrent: Secondary | ICD-10-CM | POA: Insufficient documentation

## 2017-06-17 DIAGNOSIS — F17210 Nicotine dependence, cigarettes, uncomplicated: Secondary | ICD-10-CM | POA: Insufficient documentation

## 2017-06-17 DIAGNOSIS — N3000 Acute cystitis without hematuria: Secondary | ICD-10-CM | POA: Insufficient documentation

## 2017-06-17 LAB — URINALYSIS, ROUTINE W REFLEX MICROSCOPIC
Bilirubin Urine: NEGATIVE
GLUCOSE, UA: NEGATIVE mg/dL
Ketones, ur: NEGATIVE mg/dL
Nitrite: NEGATIVE
PH: 5 (ref 5.0–8.0)
Protein, ur: NEGATIVE mg/dL
Specific Gravity, Urine: 1.014 (ref 1.005–1.030)

## 2017-06-17 NOTE — ED Notes (Signed)
Jessica - RN aware of pt's BP. 

## 2017-06-17 NOTE — ED Triage Notes (Signed)
Pt reports R sided flank pain for the past two week, denies n/v/d, denies dysuria but states she feels that she is not emptying her bladder.

## 2017-06-18 ENCOUNTER — Other Ambulatory Visit: Payer: Self-pay

## 2017-06-18 ENCOUNTER — Emergency Department (HOSPITAL_COMMUNITY): Payer: Self-pay

## 2017-06-18 ENCOUNTER — Emergency Department (HOSPITAL_COMMUNITY)
Admission: EM | Admit: 2017-06-18 | Discharge: 2017-06-18 | Disposition: A | Discharge status: Home / Self Care | Payer: Self-pay | Attending: Emergency Medicine | Admitting: Emergency Medicine

## 2017-06-18 DIAGNOSIS — K4090 Unilateral inguinal hernia, without obstruction or gangrene, not specified as recurrent: Secondary | ICD-10-CM

## 2017-06-18 DIAGNOSIS — R109 Unspecified abdominal pain: Secondary | ICD-10-CM

## 2017-06-18 DIAGNOSIS — N3000 Acute cystitis without hematuria: Secondary | ICD-10-CM

## 2017-06-18 LAB — I-STAT CHEM 8, ED
BUN: 8 mg/dL (ref 6–20)
CALCIUM ION: 1.13 mmol/L — AB (ref 1.15–1.40)
CREATININE: 0.3 mg/dL — AB (ref 0.44–1.00)
Chloride: 104 mmol/L (ref 101–111)
GLUCOSE: 89 mg/dL (ref 65–99)
HCT: 38 % (ref 36.0–46.0)
Hemoglobin: 12.9 g/dL (ref 12.0–15.0)
Potassium: 4.8 mmol/L (ref 3.5–5.1)
Sodium: 140 mmol/L (ref 135–145)
TCO2: 30 mmol/L (ref 22–32)

## 2017-06-18 LAB — CBC WITH DIFFERENTIAL/PLATELET
BASOS PCT: 0 %
Basophils Absolute: 0 10*3/uL (ref 0.0–0.1)
Eosinophils Absolute: 0 10*3/uL (ref 0.0–0.7)
Eosinophils Relative: 1 %
HEMATOCRIT: 37.8 % (ref 36.0–46.0)
HEMOGLOBIN: 12.8 g/dL (ref 12.0–15.0)
LYMPHS ABS: 1.4 10*3/uL (ref 0.7–4.0)
Lymphocytes Relative: 38 %
MCH: 31.4 pg (ref 26.0–34.0)
MCHC: 33.9 g/dL (ref 30.0–36.0)
MCV: 92.9 fL (ref 78.0–100.0)
MONO ABS: 0.4 10*3/uL (ref 0.1–1.0)
MONOS PCT: 11 %
NEUTROS PCT: 50 %
Neutro Abs: 1.8 10*3/uL (ref 1.7–7.7)
Platelets: 199 10*3/uL (ref 150–400)
RBC: 4.07 MIL/uL (ref 3.87–5.11)
RDW: 13.2 % (ref 11.5–15.5)
WBC: 3.7 10*3/uL — ABNORMAL LOW (ref 4.0–10.5)

## 2017-06-18 LAB — COMPREHENSIVE METABOLIC PANEL
ALT: 8 U/L — ABNORMAL LOW (ref 14–54)
ANION GAP: 12 (ref 5–15)
AST: 27 U/L (ref 15–41)
Albumin: 3.4 g/dL — ABNORMAL LOW (ref 3.5–5.0)
Alkaline Phosphatase: 50 U/L (ref 38–126)
BILIRUBIN TOTAL: 1.3 mg/dL — AB (ref 0.3–1.2)
BUN: 6 mg/dL (ref 6–20)
CO2: 19 mmol/L — ABNORMAL LOW (ref 22–32)
Calcium: 8.8 mg/dL — ABNORMAL LOW (ref 8.9–10.3)
Chloride: 108 mmol/L (ref 101–111)
Creatinine, Ser: 0.43 mg/dL — ABNORMAL LOW (ref 0.44–1.00)
Glucose, Bld: 79 mg/dL (ref 65–99)
Potassium: 4.1 mmol/L (ref 3.5–5.1)
Sodium: 139 mmol/L (ref 135–145)
TOTAL PROTEIN: 7.4 g/dL (ref 6.5–8.1)

## 2017-06-18 LAB — LIPASE, BLOOD: LIPASE: 36 U/L (ref 11–51)

## 2017-06-18 IMAGING — CT CT RENAL STONE PROTOCOL
2 of 4 series · 16 of 46 positions shown, 18 images · non-contrast
Comparison: None.

CLINICAL DATA: Right flank pain.

EXAM:
CT ABDOMEN AND PELVIS WITHOUT CONTRAST
TECHNIQUE: Multidetector CT imaging of the abdomen and pelvis was performed
following the standard protocol without IV contrast.

[Series 3: stone study 5.0 i30f 2 · axial · 0.68mm/px · z∈[+961,+1386]mm · 13 of 93 slices shown, 15 images]
[im 4/93  soft-tissue]
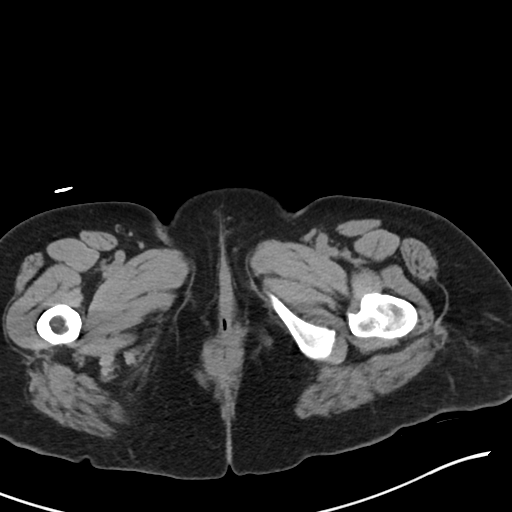
[im 4/93  bone]
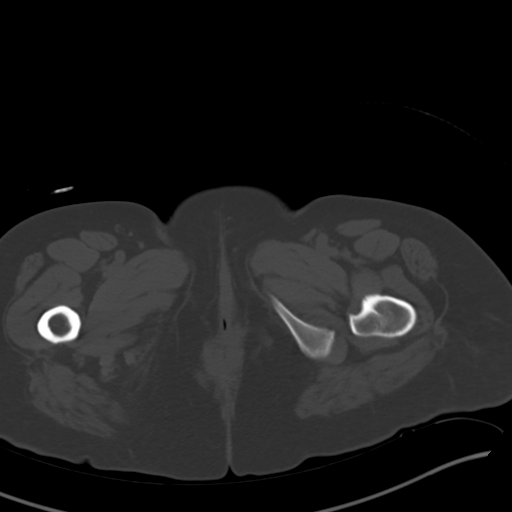
[im 12/93  soft-tissue]
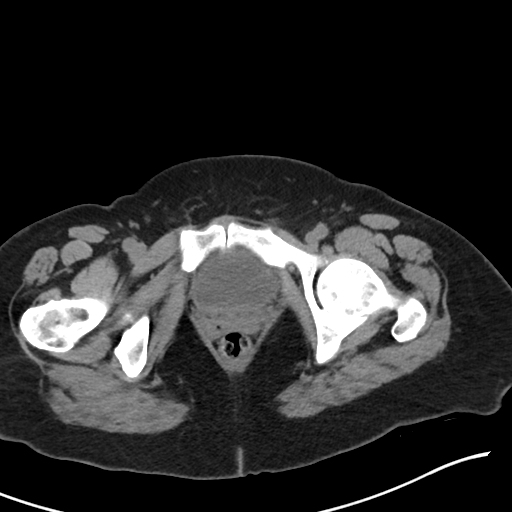
[im 20/93  soft-tissue]
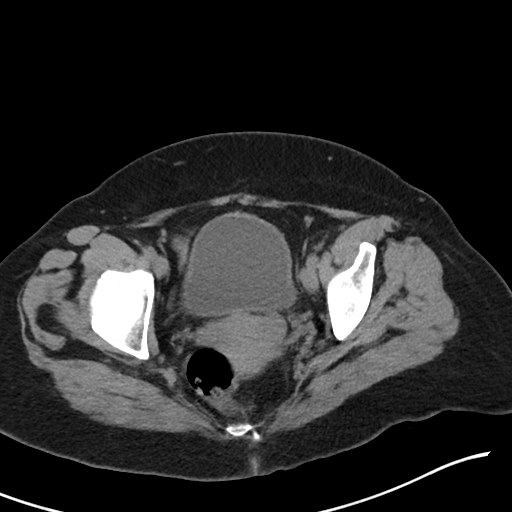
[im 27/93  soft-tissue]
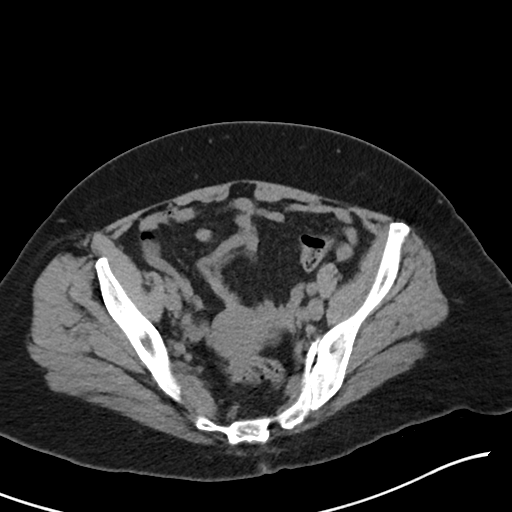
[im 31/93  soft-tissue]
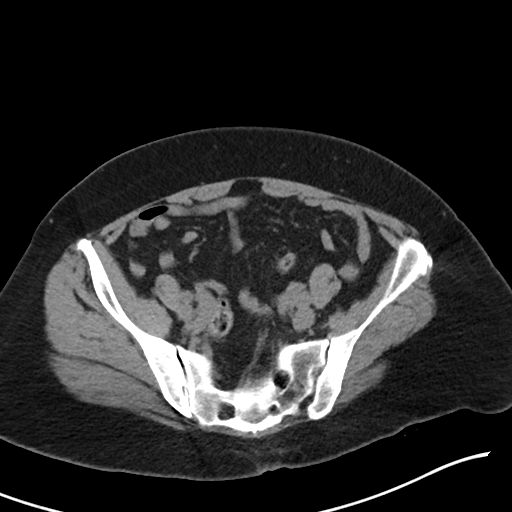
[im 39/93  soft-tissue]
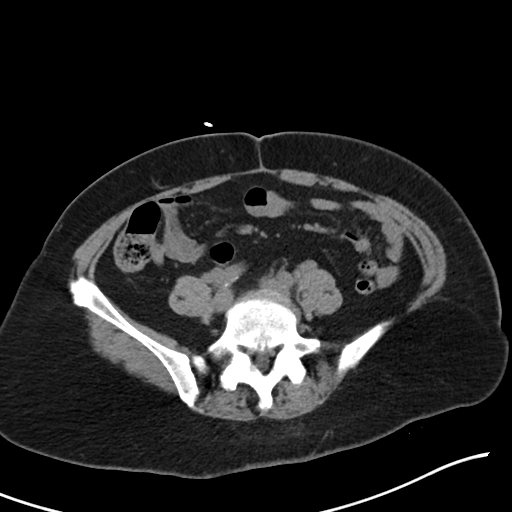
[im 47/93  soft-tissue]
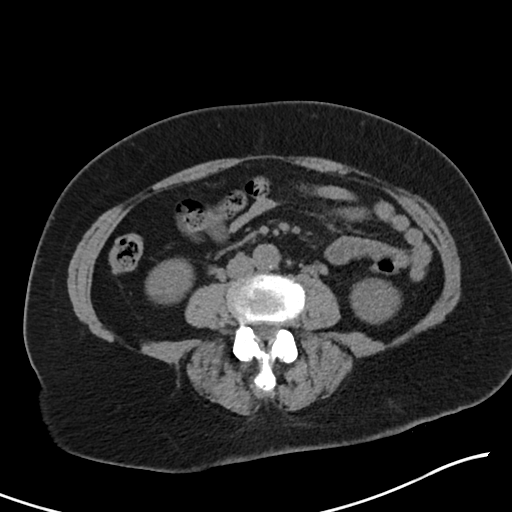
[im 54/93  soft-tissue]
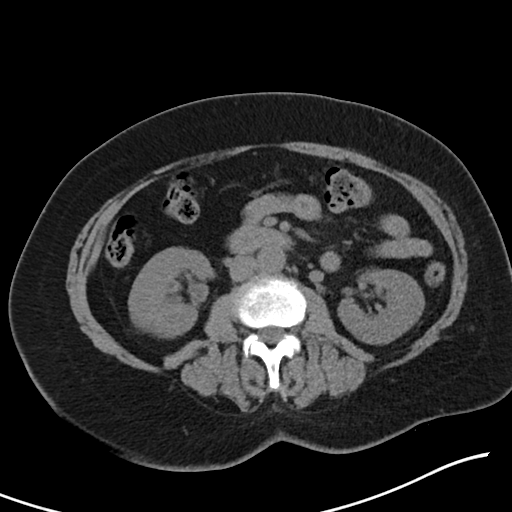
[im 62/93  soft-tissue]
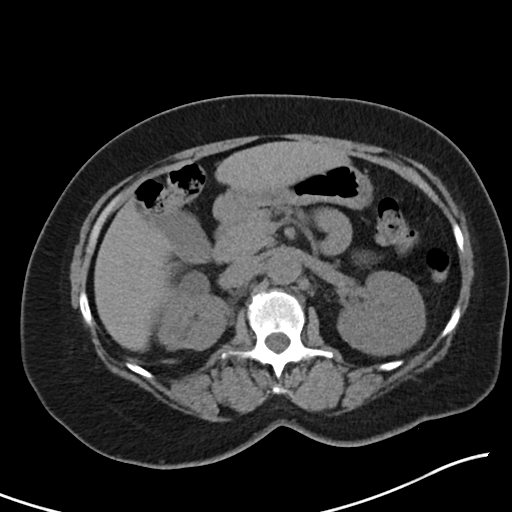
[im 62/93  bone]
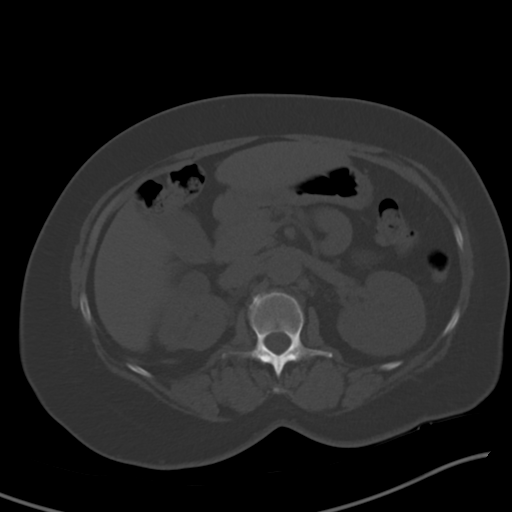
[im 66/93  soft-tissue]
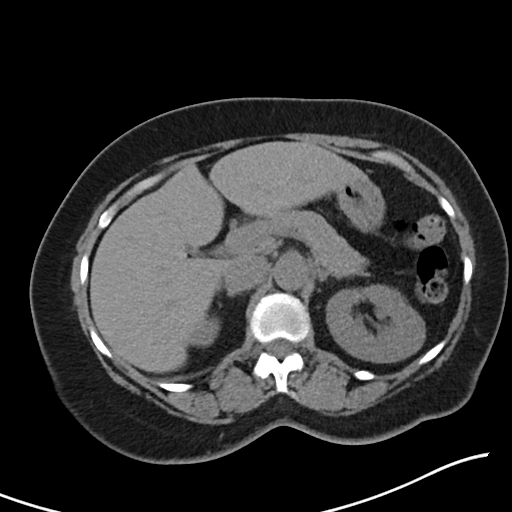
[im 73/93  soft-tissue]
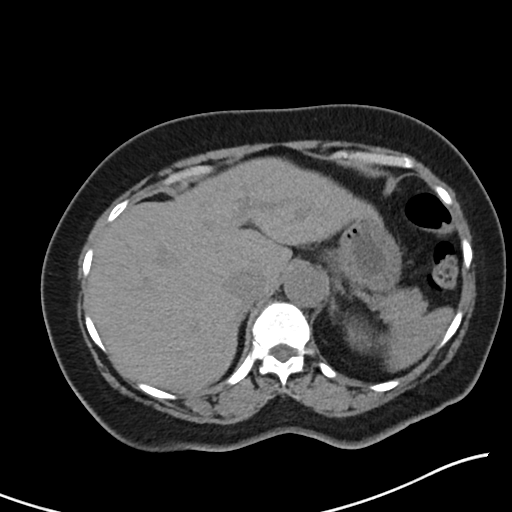
[im 81/93  soft-tissue]
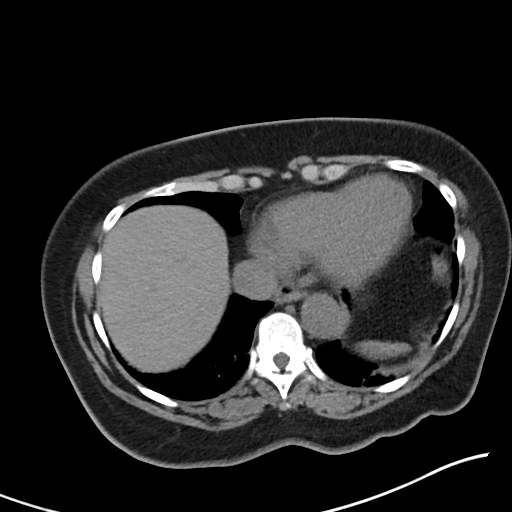
[im 89/93  soft-tissue]
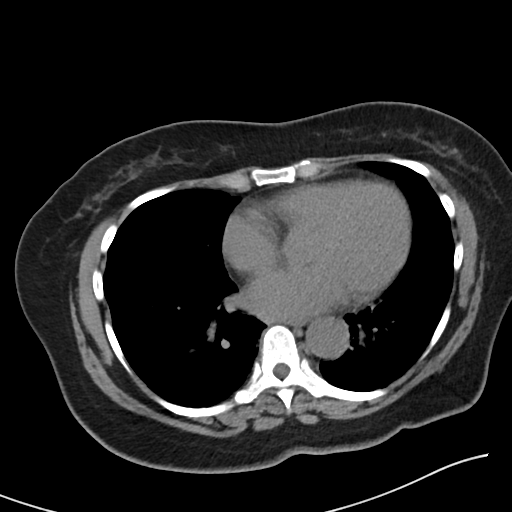

[Series 6: coronal soft tissue · coronal · 0.71mm/px · 3 of 101 slices shown]
[im 34/101  soft-tissue]
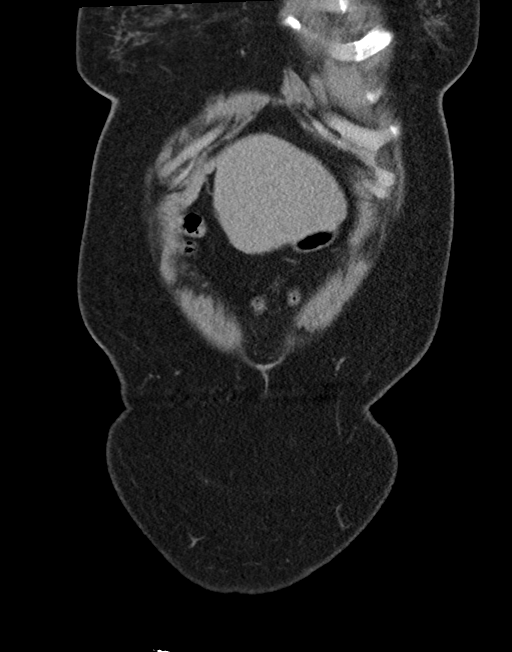
[im 45/101  soft-tissue]
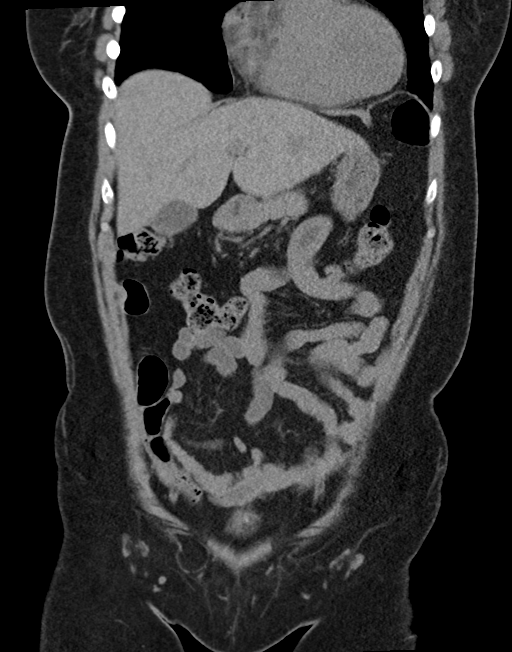
[im 56/101  soft-tissue]
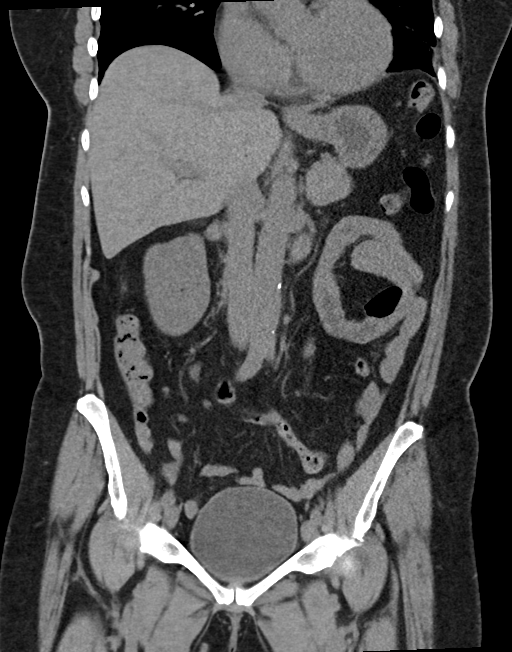

[16 of 46 positions shown; findings below may reference images not displayed]

FINDINGS: Lower chest: No acute abnormality.

Hepatobiliary: No focal liver abnormality is seen. No gallstones,
gallbladder wall thickening, or biliary dilatation.

Pancreas: Unremarkable. No pancreatic ductal dilatation or
surrounding inflammatory changes.

Spleen: Normal in size without focal abnormality.

Adrenals/Urinary Tract: Adrenal glands are unremarkable. 12 mm
hypodense, fluid attenuating right renal mass most consistent with a
cyst. 18 mm hypodense, fluid attenuating right upper pole renal mass
most consistent with a cyst. No solid renal mass. No urolithiasis or
obstructive uropathy. Bladder is unremarkable.

Stomach/Bowel: Stomach is within normal limits. Appendix appears
normal. No evidence of bowel wall thickening, distention, or
inflammatory changes.

Vascular/Lymphatic: Normal caliber abdominal aorta. Mild abdominal
aortic atherosclerosis. No enlarged abdominal or pelvic lymph nodes.

Reproductive: Uterus and bilateral adnexa are unremarkable.

Other: Small fat containing right inguinal hernia. No abdominopelvic
ascites.

Musculoskeletal: No acute osseous abnormality. No aggressive osseous
lesion. Bilateral facet arthropathy at L4-5 and L5-S1. Bilateral
sacroiliac joint osteoarthritis.
IMPRESSION: 1. No urolithiasis or obstructive uropathy.
2. No acute abdominal or pelvic pathology.
3.  Aortic Atherosclerosis ([DA]-[DA]).

## 2017-06-18 MED ORDER — SODIUM CHLORIDE 0.9 % IV BOLUS (SEPSIS)
1000.0000 mL | Freq: Once | INTRAVENOUS | Status: AC
  Administered 2017-06-18: 1000 mL via INTRAVENOUS

## 2017-06-18 MED ORDER — CEPHALEXIN 500 MG PO CAPS: 500.0000 mg | ORAL_CAPSULE | Freq: Two times a day (BID) | ORAL | 0 refills | Status: DC

## 2017-06-18 MED ORDER — FENTANYL CITRATE (PF) 100 MCG/2ML IJ SOLN
50.0000 ug | Freq: Once | INTRAMUSCULAR | Status: AC
  Administered 2017-06-18: 50 ug via INTRAVENOUS
  Filled 2017-06-18: qty 2

## 2017-06-18 MED ORDER — TRAMADOL HCL 50 MG PO TABS: 50.0000 mg | ORAL_TABLET | Freq: Four times a day (QID) | ORAL | 0 refills | Status: DC | PRN

## 2017-06-18 NOTE — ED Notes (Signed)
Patient transported to CT 

## 2017-06-18 NOTE — ED Provider Notes (Signed)
[facility] EMERGENCY DEPARTMENT Provider Note   CSN: [id number] Arrival date & time: 06/17/17  2037     History   Chief Complaint Chief Complaint  Patient presents with  . Flank Pain    HPI Jacqueline Fox is a 62 y.o. female.  Patient is a 62 year old female who presents with right flank pain.  She states that over the last 2 weeks she has had some pain in her right lower back that now radiates to her right lower abdomen.  She states is been intermittent but getting worse over the last 2-3 days.  It has been constant since yesterday.  She denies any nausea or vomiting.  No difficulties with urination.  No known fevers.  She is having normal bowel movements.  She states it hurts to lay on that side.  No history of similar symptoms in the past.  No known injuries.  She is been taking some over-the-counter pain medications without improvement in symptoms.      History reviewed. No pertinent past medical history.  There are no active problems to display for this patient.   History reviewed. No pertinent surgical history.  OB History    No data available       Home Medications    Prior to Admission medications   Medication Sig Start Date End Date Taking? Authorizing Provider  acetaminophen (TYLENOL) 500 MG tablet Take 1 tablet (500 mg total) by mouth every 6 (six) hours as needed for pain. 10/20/12   Lurene Shadow, PA-C  azithromycin (ZITHROMAX) 250 MG tablet Take 1 tablet (250 mg total) by mouth daily. Take first 2 tablets together, then 1 every day until finished. 10/20/12   Lurene Shadow, PA-C  cephALEXin (KEFLEX) 500 MG capsule Take 1 capsule (500 mg total) by mouth 2 (two) times daily. 06/18/17   Rolan Bucco, MD  diphenhydrAMINE (BENADRYL) 25 mg capsule Take 25 mg by mouth every 6 (six) hours as needed for itching or allergies.    [provider]  HYDROcodone-acetaminophen (NORCO/VICODIN) 5-325 MG per tablet Take 1-2 tablets every 6 hours as  needed for severe pain 01/28/15   Renne Crigler, PA-C  traMADol (ULTRAM) 50 MG tablet Take 1 tablet (50 mg total) by mouth every 6 (six) hours as needed. 06/18/17   Rolan Bucco, MD    Family History No family history on file.  Social History Social History   Tobacco Use  . Smoking status: Current Every Day Smoker    Types: Cigarettes  . Smokeless tobacco: Never Used  Substance Use Topics  . Alcohol use: Yes  . Drug use: No     Allergies   Patient has no known allergies.   Review of Systems Review of Systems  Constitutional: Negative for chills, diaphoresis, fatigue and fever.  HENT: Negative for congestion, rhinorrhea and sneezing.   Eyes: Negative.   Respiratory: Negative for cough, chest tightness and shortness of breath.   Cardiovascular: Negative for chest pain and leg swelling.  Gastrointestinal: Positive for abdominal pain. Negative for blood in stool, diarrhea, nausea and vomiting.  Genitourinary: Negative for difficulty urinating, flank pain, frequency and hematuria.  Musculoskeletal: Positive for back pain. Negative for arthralgias.  Skin: Negative for rash.  Neurological: Negative for dizziness, speech difficulty, weakness, numbness and headaches.     Physical Exam Updated Vital Signs BP (!) 186/97   Pulse 65   Temp 98.2 F (36.8 C) (Oral)   Resp 16   SpO2 97%   Physical  Exam  Constitutional: She is oriented to person, place, and time. She appears well-developed and well-nourished.  HENT:  Head: Normocephalic and atraumatic.  Eyes: Pupils are equal, round, and reactive to light.  Neck: Normal range of motion. Neck supple.  Cardiovascular: Normal rate, regular rhythm and normal heart sounds.  Pulmonary/Chest: Effort normal and breath sounds normal. No respiratory distress. She has no wheezes. She has no rales. She exhibits no tenderness.  Abdominal: Soft. Bowel sounds are normal. There is tenderness (Positive tenderness in the right mid back and right  mid and lower abdomen). There is no rebound and no guarding.  Musculoskeletal: Normal range of motion. She exhibits no edema.  Motor 5 out of 5 all extremities, sensation grossly intact light touch all extremities, pedal pulses intact.  Lymphadenopathy:    She has no cervical adenopathy.  Neurological: She is alert and oriented to person, place, and time.  Skin: Skin is warm and dry. No rash noted.  Psychiatric: She has a normal mood and affect.     ED Treatments / Results  Labs (all labs ordered are listed, but only abnormal results are displayed) Labs Reviewed  URINALYSIS, ROUTINE W REFLEX MICROSCOPIC - Abnormal; Notable for the following components:      Result Value   APPearance HAZY (*)    Hgb urine dipstick SMALL (*)    Leukocytes, UA SMALL (*)    Bacteria, UA RARE (*)    Squamous Epithelial / LPF 6-30 (*)    All other components within normal limits  CBC WITH DIFFERENTIAL/PLATELET - Abnormal; Notable for the following components:   WBC 3.7 (*)    All other components within normal limits  COMPREHENSIVE METABOLIC PANEL - Abnormal; Notable for the following components:   CO2 19 (*)    Creatinine, Ser 0.43 (*)    Calcium 8.8 (*)    Albumin 3.4 (*)    ALT 8 (*)    Total Bilirubin 1.3 (*)    All other components within normal limits  I-STAT CHEM 8, ED - Abnormal; Notable for the following components:   Creatinine, Ser 0.30 (*)    Calcium, Ion 1.13 (*)    All other components within normal limits  URINE CULTURE  LIPASE, BLOOD    EKG  EKG Interpretation None       Radiology Ct Renal Stone Study  Result Date: 06/18/2017 CLINICAL DATA:  Right flank pain. EXAM: CT ABDOMEN AND PELVIS WITHOUT CONTRAST TECHNIQUE: Multidetector CT imaging of the abdomen and pelvis was performed following the standard protocol without IV contrast. COMPARISON:  None. FINDINGS: Lower chest: No acute abnormality. Hepatobiliary: No focal liver abnormality is seen. No gallstones, gallbladder  wall thickening, or biliary dilatation. Pancreas: Unremarkable. No pancreatic ductal dilatation or surrounding inflammatory changes. Spleen: Normal in size without focal abnormality. Adrenals/Urinary Tract: Adrenal glands are unremarkable. 12 mm hypodense, fluid attenuating right renal mass most consistent with a cyst. 18 mm hypodense, fluid attenuating right upper pole renal mass most consistent with a cyst. No solid renal mass. No urolithiasis or obstructive uropathy. Bladder is unremarkable. Stomach/Bowel: Stomach is within normal limits. Appendix appears normal. No evidence of bowel wall thickening, distention, or inflammatory changes. Vascular/Lymphatic: Normal caliber abdominal aorta. Mild abdominal aortic atherosclerosis. No enlarged abdominal or pelvic lymph nodes. Reproductive: Uterus and bilateral adnexa are unremarkable. Other: Small fat containing right inguinal hernia. No abdominopelvic ascites. Musculoskeletal: No acute osseous abnormality. No aggressive osseous lesion. Bilateral facet arthropathy at L4-5 and L5-S1. Bilateral sacroiliac joint osteoarthritis. IMPRESSION: 1.  No urolithiasis or obstructive uropathy. 2. No acute abdominal or pelvic pathology. 3.  Aortic Atherosclerosis (ICD10-I70.0). Electronically Signed   By: Elige KoHetal  Patel   On: 06/18/2017 08:53    Procedures Procedures (including critical care time)  Medications Ordered in ED Medications  fentaNYL (SUBLIMAZE) injection 50 mcg (50 mcg Intravenous Given 06/18/17 0808)  sodium chloride 0.9 % bolus 1,000 mL (0 mLs Intravenous Stopped 06/18/17 0943)  fentaNYL (SUBLIMAZE) injection 50 mcg (50 mcg Intravenous Given 06/18/17 1053)     Initial Impression / Assessment and Plan / ED Course  I have reviewed the triage vital signs and the nursing notes.  Pertinent labs & imaging results that were available during my care of the patient were reviewed by me and considered in my medical decision making (see chart for details).    Patient  is a 62 year old female who presents with right back and flank pain.  Her urine does have some signs of infection and was sent for culture.  I will treat her with antibiotics.  She has no evidence of kidney stone or pyelonephritis.  CT scan shows a small fat-containing inguinal hernia but no bowel, no signs of obstruction.  No other acute abnormality.  She is feeling better after treatment in the ED.  She was discharged home in good condition.  She was given a prescription for Keflex and tramadol for pain.  She was encouraged to follow-up with a PCP.  Return precautions were given.  Final Clinical Impressions(s) / ED Diagnoses   Final diagnoses:  Right flank pain  Non-recurrent unilateral inguinal hernia without obstruction or gangrene  Acute cystitis without hematuria    ED Discharge Orders        Ordered    cephALEXin (KEFLEX) 500 MG capsule  2 times daily     06/18/17 1205    traMADol (ULTRAM) 50 MG tablet  Every 6 hours PRN     06/18/17 1205       Rolan BuccoBelfi, Callahan Wild, MD 06/18/17 1207

## 2017-06-19 LAB — URINE CULTURE

## 2017-07-10 ENCOUNTER — Emergency Department (HOSPITAL_COMMUNITY): Payer: Self-pay

## 2017-07-10 ENCOUNTER — Emergency Department (HOSPITAL_COMMUNITY)
Admission: EM | Admit: 2017-07-10 | Discharge: 2017-07-11 | Disposition: A | Discharge status: Home / Self Care | Payer: Self-pay | Attending: Emergency Medicine | Admitting: Emergency Medicine

## 2017-07-10 ENCOUNTER — Encounter (HOSPITAL_COMMUNITY): Payer: Self-pay | Admitting: *Deleted

## 2017-07-10 DIAGNOSIS — K625 Hemorrhage of anus and rectum: Secondary | ICD-10-CM | POA: Insufficient documentation

## 2017-07-10 DIAGNOSIS — F17200 Nicotine dependence, unspecified, uncomplicated: Secondary | ICD-10-CM | POA: Insufficient documentation

## 2017-07-10 DIAGNOSIS — K529 Noninfective gastroenteritis and colitis, unspecified: Secondary | ICD-10-CM | POA: Insufficient documentation

## 2017-07-10 LAB — CBC
HCT: 38.8 % (ref 36.0–46.0)
Hemoglobin: 13.3 g/dL (ref 12.0–15.0)
MCH: 31.2 pg (ref 26.0–34.0)
MCHC: 34.3 g/dL (ref 30.0–36.0)
MCV: 91.1 fL (ref 78.0–100.0)
PLATELETS: 238 10*3/uL (ref 150–400)
RBC: 4.26 MIL/uL (ref 3.87–5.11)
RDW: 13 % (ref 11.5–15.5)
WBC: 6.3 10*3/uL (ref 4.0–10.5)

## 2017-07-10 LAB — COMPREHENSIVE METABOLIC PANEL
ALK PHOS: 59 U/L (ref 38–126)
ALT: 15 U/L (ref 14–54)
AST: 20 U/L (ref 15–41)
Albumin: 4 g/dL (ref 3.5–5.0)
Anion gap: 10 (ref 5–15)
BILIRUBIN TOTAL: 0.5 mg/dL (ref 0.3–1.2)
BUN: 6 mg/dL (ref 6–20)
CALCIUM: 9.8 mg/dL (ref 8.9–10.3)
CO2: 23 mmol/L (ref 22–32)
Chloride: 105 mmol/L (ref 101–111)
Creatinine, Ser: 0.54 mg/dL (ref 0.44–1.00)
Glucose, Bld: 95 mg/dL (ref 65–99)
Potassium: 3.3 mmol/L — ABNORMAL LOW (ref 3.5–5.1)
Sodium: 138 mmol/L (ref 135–145)
TOTAL PROTEIN: 8.3 g/dL — AB (ref 6.5–8.1)

## 2017-07-10 LAB — TYPE AND SCREEN
ABO/RH(D): A POS
ANTIBODY SCREEN: NEGATIVE

## 2017-07-10 IMAGING — CT CT ABD-PELV W/ CM
2 of 5 series · 16 of 46 positions shown, 18 images · IV contrast (ISOVUE)
Comparison: [DATE]

CLINICAL DATA: Bloody stools lower abdominal pain

EXAM:
CT ABDOMEN AND PELVIS WITH CONTRAST
TECHNIQUE: Multidetector CT imaging of the abdomen and pelvis was performed
using the standard protocol following bolus administration of
intravenous contrast.
CONTRAST:  100mL [R1] IOPAMIDOL ([R1]) INJECTION 61%

[Series 2: axial st · axial · 0.70mm/px · z∈[+1141,+1531]mm · 13 of 91 slices shown, 15 images]
[im 7/91  soft-tissue]
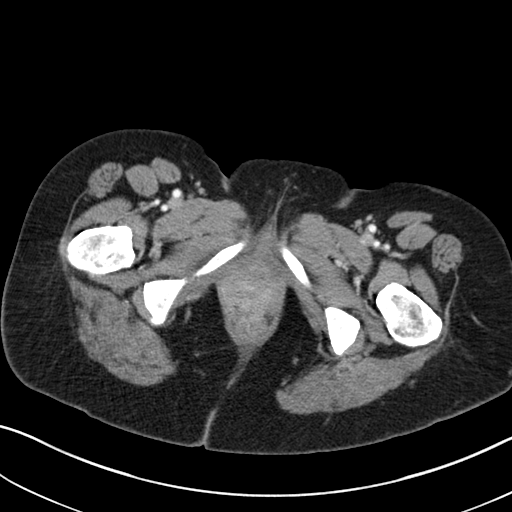
[im 7/91  bone]
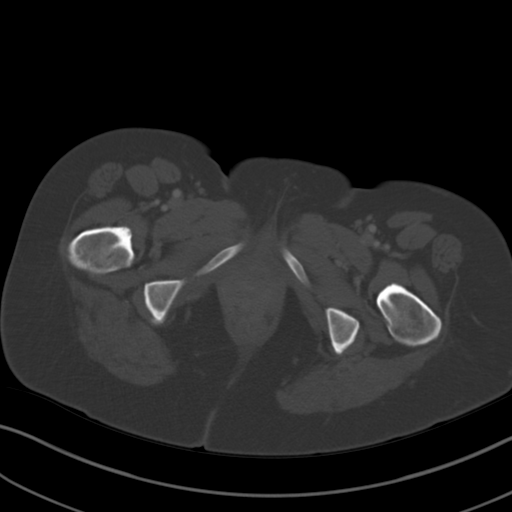
[im 13/91  soft-tissue]
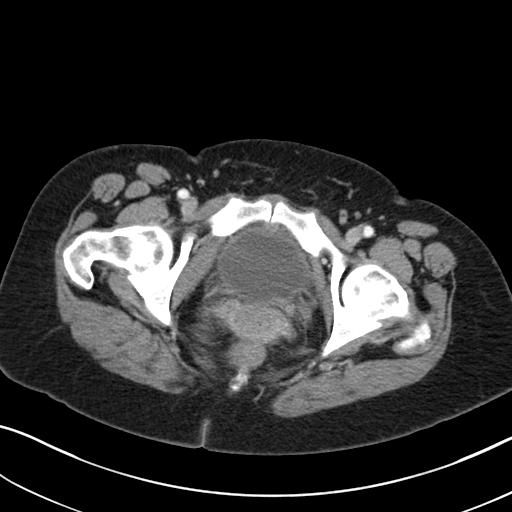
[im 19/91  soft-tissue]
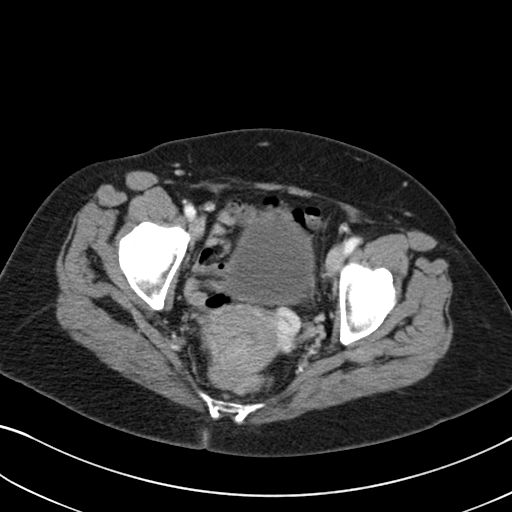
[im 25/91  soft-tissue]
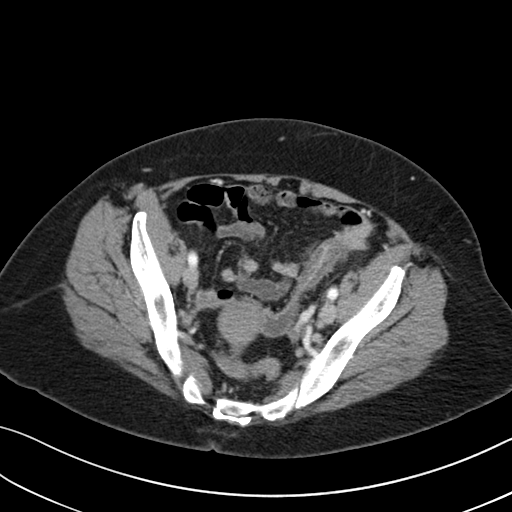
[im 31/91  soft-tissue]
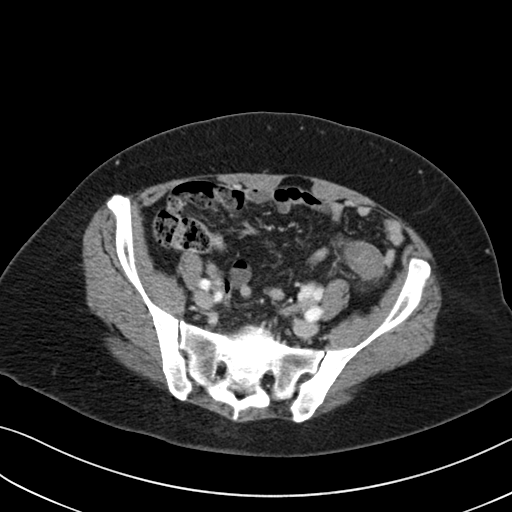
[im 37/91  soft-tissue]
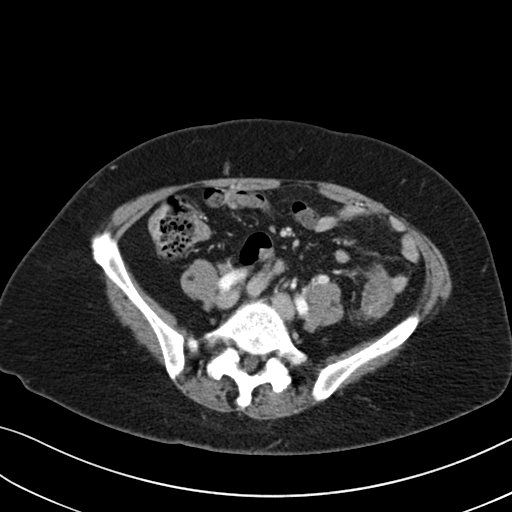
[im 49/91  soft-tissue]
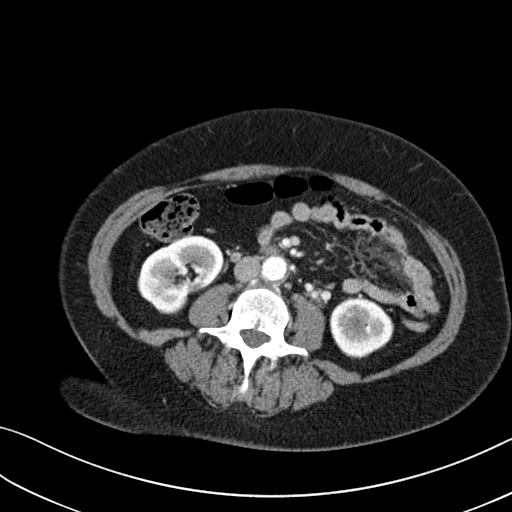
[im 55/91  soft-tissue]
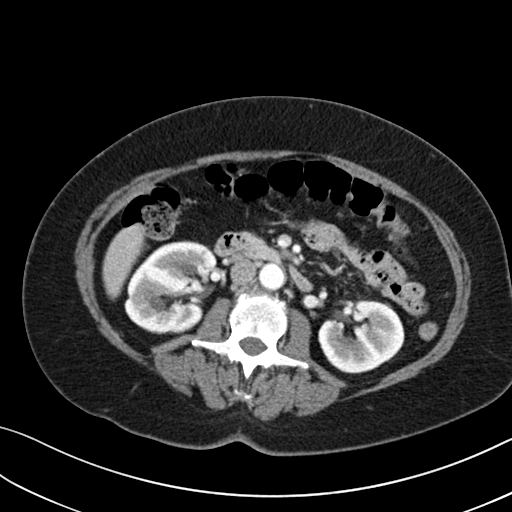
[im 61/91  soft-tissue]
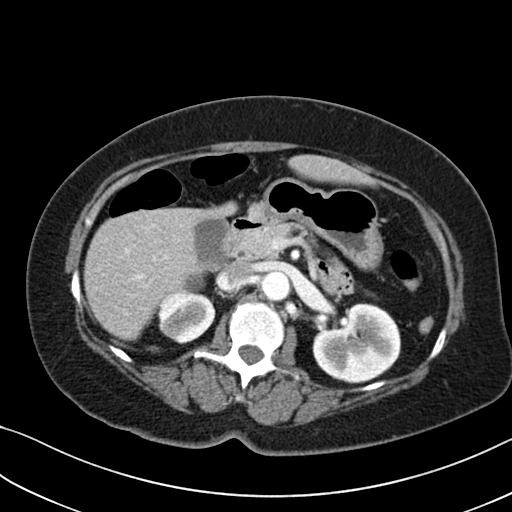
[im 61/91  bone]
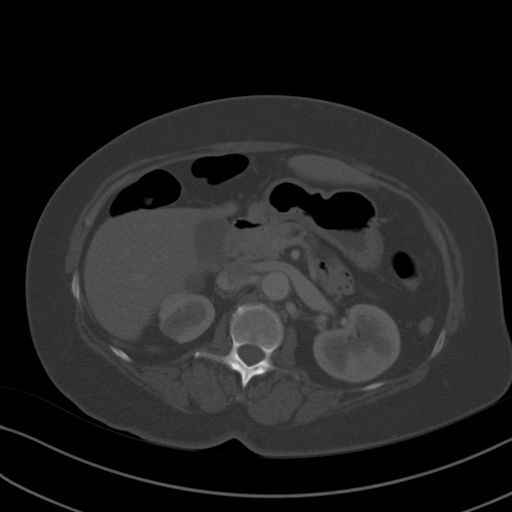
[im 67/91  soft-tissue]
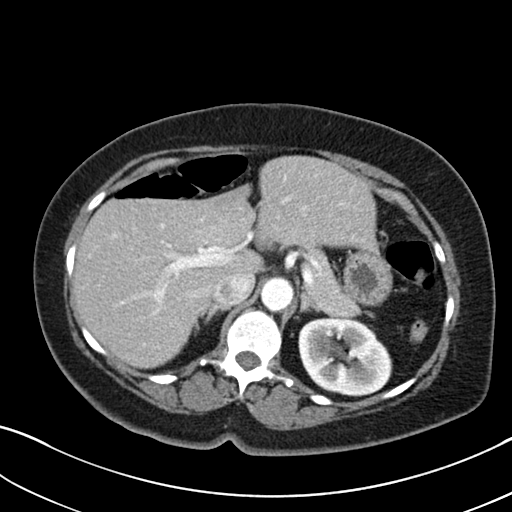
[im 73/91  soft-tissue]
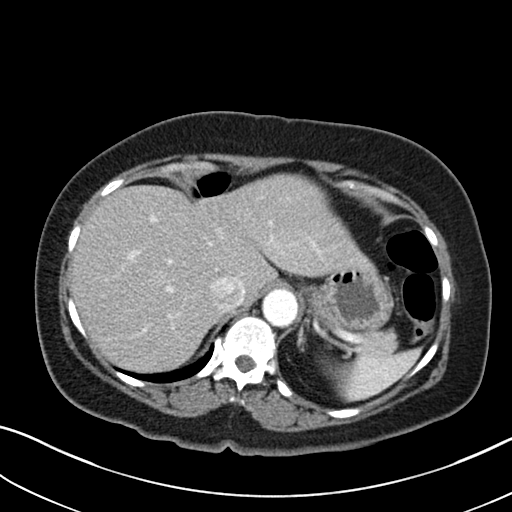
[im 79/91  soft-tissue]
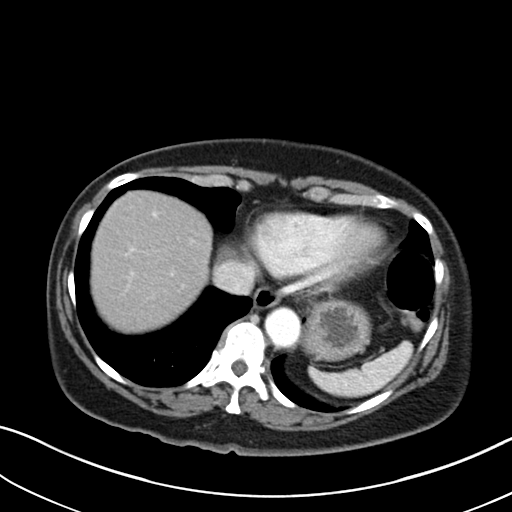
[im 85/91  soft-tissue]
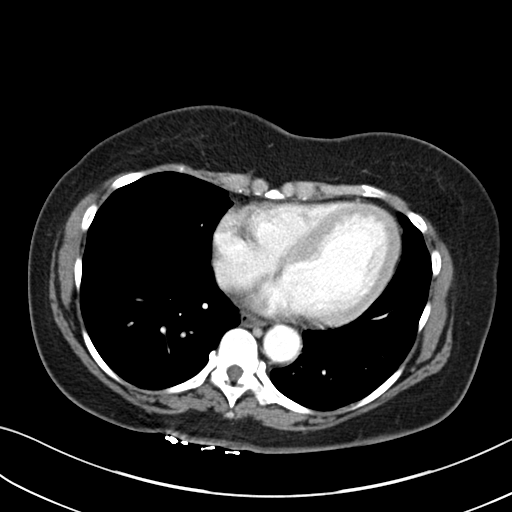

[Series 5: coronal st · coronal · 0.72mm/px · 3 of 91 slices shown]
[im 31/91  soft-tissue]
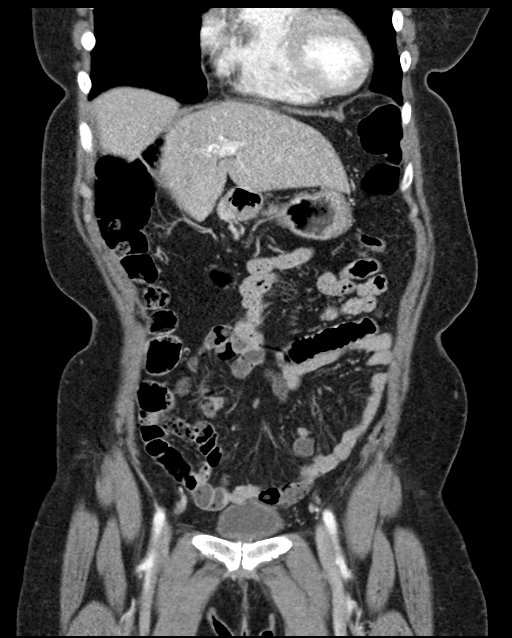
[im 41/91  soft-tissue]
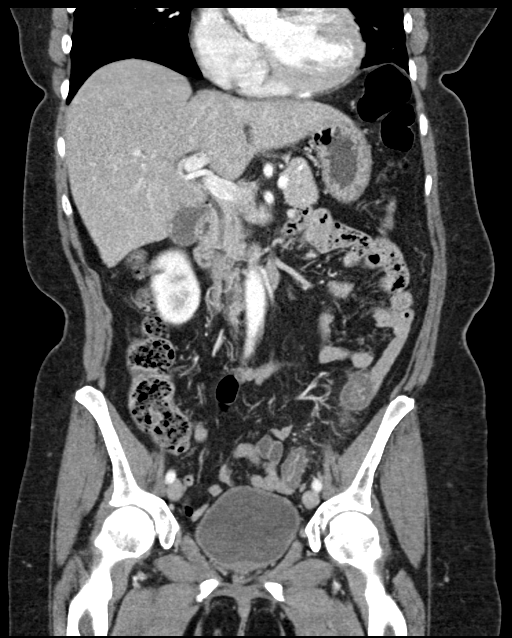
[im 51/91  soft-tissue]
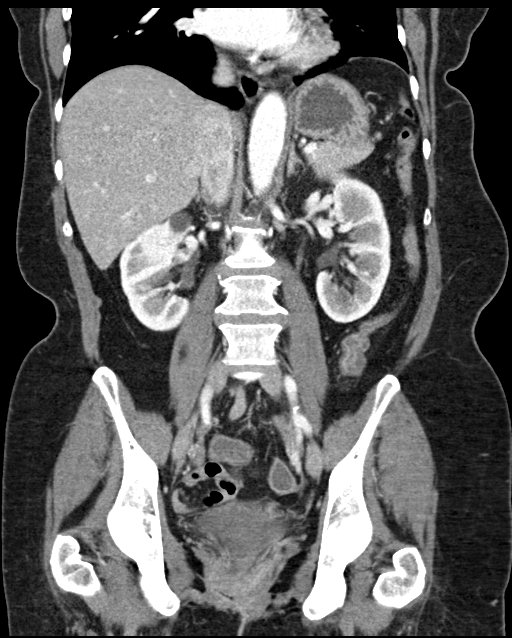

[16 of 46 positions shown; findings below may reference images not displayed]

FINDINGS: Lower chest: Lung bases demonstrate no acute consolidation or
effusion. Heart size is within normal limits.

Hepatobiliary: No focal liver abnormality is seen. No gallstones,
gallbladder wall thickening, or biliary dilatation.

Pancreas: Unremarkable. No pancreatic ductal dilatation or
surrounding inflammatory changes.

Spleen: Normal in size without focal abnormality.

Adrenals/Urinary Tract: Adrenal glands are within normal limits.
Cortical scarring in the upper pole of the right kidney. Cysts
within the right kidney. Bladder unremarkable

Stomach/Bowel: The stomach is nonenlarged. No dilated small bowel.
Appendix is normal.

Focal wall thickening involving the distal descending and sigmoid
colon. No significant diverticula in the region.

Vascular/Lymphatic: Mild aortic atherosclerosis. No aneurysmal
dilatation. No significantly enlarged lymph nodes.

Reproductive: No adnexal mass. Lobulated contour of the uterus, with
multiple masses, likely fibroids.

Other: Negative for free air or significant free fluid.
Periumbilical fat hernias.

Musculoskeletal: No acute or suspicious abnormality.
IMPRESSION: 1. Focal wall thickening and surrounding inflammatory changes in the
distal descending and sigmoid colon consistent with colitis of
infectious, inflammatory, or ischemic etiology. Negative for
perforation or abscess
2. Scarring in the right kidney with multiple cysts.
3. Lobulated uterine contour with multiple masses, likely fibroids.

## 2017-07-10 MED ORDER — IOPAMIDOL (ISOVUE-300) INJECTION 61%
INTRAVENOUS | Status: AC
  Administered 2017-07-10: 100 mL
  Filled 2017-07-10: qty 100

## 2017-07-10 MED ORDER — MORPHINE SULFATE (PF) 4 MG/ML IV SOLN
4.0000 mg | Freq: Once | INTRAVENOUS | Status: AC
  Administered 2017-07-10: 4 mg via INTRAVENOUS
  Filled 2017-07-10: qty 1

## 2017-07-10 NOTE — ED Provider Notes (Addendum)
[facility]-EMERGENCY DEPT Provider Note   CSN: [id number] Arrival date & time: 07/10/17  1510     History   Chief Complaint Chief Complaint  Patient presents with  . GI Bleeding    HPI Jacqueline Fox is a 62 y.o. female.  Patient presents for evaluation of rectal bleeding that started this morning and has been persistent throughout the day. No fever, nausea or vomiting. She is not passing any stool but reports a stream of BRB per rectum passes when she feels she has to have a bowel movement. No history of same. She reports bilateral, R>L lower abdominal pain. No urinary symptoms. She has never has a colonoscopy or bowel problems in the past.    The history is provided by the patient and a relative. No language interpreter was used.    History reviewed. No pertinent past medical history.  There are no active problems to display for this patient.   History reviewed. No pertinent surgical history.  OB History    No data available       Home Medications    Prior to Admission medications   Medication Sig Start Date End Date Taking? Authorizing Provider  aspirin-acetaminophen-caffeine (EXCEDRIN MIGRAINE) [phone] MG tablet Take 1 tablet by mouth every 6 (six) hours as needed for headache.   Yes [provider]  acetaminophen (TYLENOL) 500 MG tablet Take 1 tablet (500 mg total) by mouth every 6 (six) hours as needed for pain. Patient not taking: Reported on 07/10/2017 10/20/12   Lurene ShadowPhelps, Erin O, PA-C  azithromycin (ZITHROMAX) 250 MG tablet Take 1 tablet (250 mg total) by mouth daily. Take first 2 tablets together, then 1 every day until finished. Patient not taking: Reported on 07/10/2017 10/20/12   Lurene ShadowPhelps, Erin O, PA-C  cephALEXin (KEFLEX) 500 MG capsule Take 1 capsule (500 mg total) by mouth 2 (two) times daily. Patient not taking: Reported on 07/10/2017 06/18/17   Rolan BuccoBelfi, Melanie, MD  HYDROcodone-acetaminophen (NORCO/VICODIN) 5-325 MG per tablet  Take 1-2 tablets every 6 hours as needed for severe pain Patient not taking: Reported on 07/10/2017 01/28/15   Renne CriglerGeiple, Joshua, PA-C  traMADol (ULTRAM) 50 MG tablet Take 1 tablet (50 mg total) by mouth every 6 (six) hours as needed. Patient not taking: Reported on 07/10/2017 06/18/17   Rolan BuccoBelfi, Melanie, MD    Family History No family history on file.  Social History Social History   Tobacco Use  . Smoking status: Current Every Day Smoker    Types: Cigarettes  . Smokeless tobacco: Never Used  Substance Use Topics  . Alcohol use: Yes  . Drug use: No     Allergies   Patient has no known allergies.   Review of Systems Review of Systems  Constitutional: Negative for chills, fatigue and fever.  Respiratory: Negative.  Negative for shortness of breath.   Cardiovascular: Negative.   Gastrointestinal: Positive for abdominal pain and anal bleeding. Negative for abdominal distention, nausea and vomiting.  Genitourinary: Negative.  Negative for dysuria and hematuria.  Musculoskeletal: Negative.   Skin: Negative.   Neurological: Negative.  Negative for light-headedness.     Physical Exam Updated Vital Signs BP (!) 194/101 Comment: informed the nurse of b/p  Pulse 72   Temp 98.2 F (36.8 C) (Oral)   Resp 19   SpO2 100%   Physical Exam  Constitutional: She is oriented to person, place, and time. She appears well-developed and well-nourished.  HENT:  Head: Normocephalic.  Neck: Normal range of motion.  Neck supple.  Cardiovascular: Normal rate and regular rhythm.  Pulmonary/Chest: Effort normal and breath sounds normal. She has no wheezes. She has no rales.  Abdominal: Soft. Bowel sounds are normal. She exhibits no distension. There is tenderness (Diffuse tenderness, greater in lower abdomen. ). There is no rebound and no guarding.  Musculoskeletal: Normal range of motion.  Neurological: She is alert and oriented to person, place, and time.  Skin: Skin is warm and dry. No rash noted.   Psychiatric: She has a normal mood and affect.     ED Treatments / Results  Labs (all labs ordered are listed, but only abnormal results are displayed) Labs Reviewed  COMPREHENSIVE METABOLIC PANEL - Abnormal; Notable for the following components:      Result Value   Potassium 3.3 (*)    Total Protein 8.3 (*)    All other components within normal limits  CBC  TYPE AND SCREEN  ABO/RH   Results for orders placed or performed during the hospital encounter of 07/10/17  Comprehensive metabolic panel  Result Value Ref Range   Sodium 138 135 - 145 mmol/L   Potassium 3.3 (L) 3.5 - 5.1 mmol/L   Chloride 105 101 - 111 mmol/L   CO2 23 22 - 32 mmol/L   Glucose, Bld 95 65 - 99 mg/dL   BUN 6 6 - 20 mg/dL   Creatinine, Ser 7.82 0.44 - 1.00 mg/dL   Calcium 9.8 8.9 - 95.6 mg/dL   Total Protein 8.3 (H) 6.5 - 8.1 g/dL   Albumin 4.0 3.5 - 5.0 g/dL   AST 20 15 - 41 U/L   ALT 15 14 - 54 U/L   Alkaline Phosphatase 59 38 - 126 U/L   Total Bilirubin 0.5 0.3 - 1.2 mg/dL   GFR calc non Af Amer >60 >60 mL/min   GFR calc Af Amer >60 >60 mL/min   Anion gap 10 5 - 15  CBC  Result Value Ref Range   WBC 6.3 4.0 - 10.5 K/uL   RBC 4.26 3.87 - 5.11 MIL/uL   Hemoglobin 13.3 12.0 - 15.0 g/dL   HCT 21.3 08.6 - 57.8 %   MCV 91.1 78.0 - 100.0 fL   MCH 31.2 26.0 - 34.0 pg   MCHC 34.3 30.0 - 36.0 g/dL   RDW 46.9 62.9 - 52.8 %   Platelets 238 150 - 400 K/uL  Type and screen [facility]  Result Value Ref Range   ABO/RH(D) A POS    Antibody Screen NEG    Sample Expiration      07/13/2017 Performed at [facility], [address] [place], [place] [address]   ABO/Rh  Result Value Ref Range   ABO/RH(D)      A POS Performed at [facility], [address], [place], [place] [address]     EKG  EKG Interpretation None       Radiology No results found. Ct Abdomen Pelvis W Contrast  Result Date: 07/10/2017 CLINICAL DATA:  Bloody stools  lower abdominal pain EXAM: CT ABDOMEN AND PELVIS WITH CONTRAST TECHNIQUE: Multidetector CT imaging of the abdomen and pelvis was performed using the standard protocol following bolus administration of intravenous contrast. CONTRAST:  ISOVUE-300 IOPAMIDOL (ISOVUE-300) INJECTION 61% COMPARISON:  06/18/2017 FINDINGS: Lower chest: Lung bases demonstrate no acute consolidation or effusion. Heart size is within normal limits. Hepatobiliary: No focal liver abnormality is seen. No gallstones, gallbladder wall thickening, or biliary dilatation. Pancreas: Unremarkable. No pancreatic  ductal dilatation or surrounding inflammatory changes. Spleen: Normal in size without focal abnormality. Adrenals/Urinary Tract: Adrenal glands are within normal limits. Cortical scarring in the upper pole of the right kidney. Cysts within the right kidney. Bladder unremarkable Stomach/Bowel: The stomach is nonenlarged. No dilated small bowel. Appendix is normal. Focal wall thickening involving the distal descending and sigmoid colon. No significant diverticula in the region. Vascular/Lymphatic: Mild aortic atherosclerosis. No aneurysmal dilatation. No significantly enlarged lymph nodes. Reproductive: No adnexal mass. Lobulated contour of the uterus, with multiple masses, likely fibroids. Other: Negative for free air or significant free fluid. Periumbilical fat hernias. Musculoskeletal: No acute or suspicious abnormality. IMPRESSION: 1. Focal wall thickening and surrounding inflammatory changes in the distal descending and sigmoid colon consistent with colitis of infectious, inflammatory, or ischemic etiology. Negative for perforation or abscess 2. Scarring in the right kidney with multiple cysts. 3. Lobulated uterine contour with multiple masses, likely fibroids. Electronically Signed   By: Jasmine Pang M.D.   On: 07/10/2017 23:27   Ct Renal Stone Study  Result Date: 06/18/2017 CLINICAL DATA:  Right flank pain. EXAM: CT ABDOMEN AND  PELVIS WITHOUT CONTRAST TECHNIQUE: Multidetector CT imaging of the abdomen and pelvis was performed following the standard protocol without IV contrast. COMPARISON:  None. FINDINGS: Lower chest: No acute abnormality. Hepatobiliary: No focal liver abnormality is seen. No gallstones, gallbladder wall thickening, or biliary dilatation. Pancreas: Unremarkable. No pancreatic ductal dilatation or surrounding inflammatory changes. Spleen: Normal in size without focal abnormality. Adrenals/Urinary Tract: Adrenal glands are unremarkable. 12 mm hypodense, fluid attenuating right renal mass most consistent with a cyst. 18 mm hypodense, fluid attenuating right upper pole renal mass most consistent with a cyst. No solid renal mass. No urolithiasis or obstructive uropathy. Bladder is unremarkable. Stomach/Bowel: Stomach is within normal limits. Appendix appears normal. No evidence of bowel wall thickening, distention, or inflammatory changes. Vascular/Lymphatic: Normal caliber abdominal aorta. Mild abdominal aortic atherosclerosis. No enlarged abdominal or pelvic lymph nodes. Reproductive: Uterus and bilateral adnexa are unremarkable. Other: Small fat containing right inguinal hernia. No abdominopelvic ascites. Musculoskeletal: No acute osseous abnormality. No aggressive osseous lesion. Bilateral facet arthropathy at L4-5 and L5-S1. Bilateral sacroiliac joint osteoarthritis. IMPRESSION: 1. No urolithiasis or obstructive uropathy. 2. No acute abdominal or pelvic pathology. 3.  Aortic Atherosclerosis (ICD10-I70.0). Electronically Signed   By: Elige Ko   On: 06/18/2017 08:53    Procedures Procedures (including critical care time)  Medications Ordered in ED Medications  morphine 4 MG/ML injection 4 mg (not administered)  iopamidol (ISOVUE-300) 61 % injection (100 mLs  Contrast Given 07/10/17 2305)     Initial Impression / Assessment and Plan / ED Course  I have reviewed the triage vital signs and the nursing  notes.  Pertinent labs & imaging results that were available during my care of the patient were reviewed by me and considered in my medical decision making (see chart for details).     Patient presents with copious rectal bleeding, BRB, with absence of stool. Symptoms started earlier today. No lightheadedness, syncope, nausea, vomiting or fever.   Abdominal exam is essentially benign with diffuse tenderness. Hemoglobin is over 13. No hypotension or tachycardia.   CT scan shows findings c/w colitis. No perforation or abscess. The patient is felt stable for discharge home and close GI follow up. Return precautions discussed in detail. Patient and family are comfortable with discharge home.   Final Clinical Impressions(s) / ED Diagnoses   Final diagnoses:  None   1. Lower GI bleeding  2. Colitis    ED Discharge Orders    None       Elpidio Anis, PA-C 07/11/17 0726    Elpidio Anis, PA-C 07/11/17 1610    Phineas Real Latanya Maudlin, MD 07/12/17 [phone]

## 2017-07-10 NOTE — ED Triage Notes (Addendum)
Pt complains of blood in stool since this morning. Pt has lower abdominal pain. Pt states blood is bright red. Pt states she has had ~5 bloody bowel movements.

## 2017-07-11 LAB — ABO/RH: ABO/RH(D): A POS

## 2017-07-11 MED ORDER — CIPROFLOXACIN HCL 500 MG PO TABS: 500.0000 mg | ORAL_TABLET | Freq: Two times a day (BID) | ORAL | 0 refills | Status: DC

## 2017-07-11 MED ORDER — METRONIDAZOLE 500 MG PO TABS
500.0000 mg | ORAL_TABLET | Freq: Once | ORAL | Status: AC
  Administered 2017-07-11: 500 mg via ORAL
  Filled 2017-07-11: qty 1

## 2017-07-11 MED ORDER — OXYCODONE-ACETAMINOPHEN 5-325 MG PO TABS: 1.0000 | ORAL_TABLET | ORAL | 0 refills | Status: DC | PRN

## 2017-07-11 MED ORDER — CIPROFLOXACIN HCL 500 MG PO TABS
500.0000 mg | ORAL_TABLET | Freq: Once | ORAL | Status: AC
  Administered 2017-07-11: 500 mg via ORAL
  Filled 2017-07-11: qty 1

## 2017-07-11 MED ORDER — METRONIDAZOLE 500 MG PO TABS: 500.0000 mg | ORAL_TABLET | Freq: Three times a day (TID) | ORAL | 0 refills | Status: DC

## 2017-07-11 NOTE — Discharge Instructions (Signed)
Take medications as prescribed. [facility] for recheck and to insure resolution of infection. Return to the ED if you have any high fever, severe pain or new concern.

## 2017-08-20 ENCOUNTER — Encounter: Payer: Self-pay | Admitting: Internal Medicine

## 2017-08-20 ENCOUNTER — Ambulatory Visit: Payer: Self-pay | Admitting: Internal Medicine

## 2017-08-20 VITALS — BP 160/110 | HR 84 | Resp 12 | Ht 63.25 in | Wt 152.0 lb

## 2017-08-20 DIAGNOSIS — Z1231 Encounter for screening mammogram for malignant neoplasm of breast: Secondary | ICD-10-CM

## 2017-08-20 DIAGNOSIS — Z23 Encounter for immunization: Secondary | ICD-10-CM

## 2017-08-20 DIAGNOSIS — Z716 Tobacco abuse counseling: Secondary | ICD-10-CM

## 2017-08-20 DIAGNOSIS — L819 Disorder of pigmentation, unspecified: Secondary | ICD-10-CM

## 2017-08-20 DIAGNOSIS — I10 Essential (primary) hypertension: Secondary | ICD-10-CM

## 2017-08-20 DIAGNOSIS — Z1239 Encounter for other screening for malignant neoplasm of breast: Secondary | ICD-10-CM

## 2017-08-20 DIAGNOSIS — Z809 Family history of malignant neoplasm, unspecified: Secondary | ICD-10-CM

## 2017-08-20 MED ORDER — AMLODIPINE BESYLATE 5 MG PO TABS: 5.0000 mg | ORAL_TABLET | Freq: Every day | ORAL | 11 refills | Status: DC

## 2017-08-20 NOTE — Progress Notes (Signed)
Subjective:    Patient ID: Jacqueline Fox, female    DOB: 1955/07/13, 62 y.o.   MRN: [id number]  HPI  Here to establish  1.  Essential Hypertension:  Diagnosed many years ago.  Managed with weight loss. Has never taken medication for this. Walks regularly 45 minutes daily with her niece.  Diet:  Breakfast:  Bacon, liver sausage, eggs--generally boiled or scrambled.  Sometimes nothing. Drinks water and 2 cups coffee with a packet of creamer each.    Lunch: dinner leftovers--chicken, liver, pork chops, pig feet or chitlins, meatloaf, [place] necks.  Will have a starch and vegetable.  Hamburger, chicken, pizza.  Water  Dinner:  See lunch leftovers.    2.  Pigment Loss:  Has been going on for over 1 year.  Ears, scalp, neck, and chest.      3.  Significant family history of GI cancer etiology:  Father, sister with what sounds like cholangiocarcinoma and sisters's niece with colon cancer.  Patient seen in ED in February for abdominal pain in right lower quadrant for which she underwent CT of abdomen  Also with bleeding with urination just subsequent to her ED visit for the pain--did have a UTI diagnosed with those symptoms.  No outpatient medications have been marked as taking for the 08/20/17 encounter (Office Visit) with Julieanne Manson, MD.    No Known Allergies   Past Medical History:  Diagnosis Date  . Hypertension     No past surgical history on file.  Family History  Problem Relation Age of Onset  . Diabetes Mother   . Heart disease Mother        CHF  . Hypertension Mother   . Kidney disease Mother   . Arthritis Mother   . Dementia Mother   . Cancer Father 73       colon  . Cancer Sister 40       ?cholangiocarcinoma  . Cancer Brother 19       lung--smoker  also, prostate CA when 96-54 yo  . Cancer Brother 72       prostate  . Cancer Brother 77       Prostate--treated successfuly  . Cancer Other 101       colon--sister's daughter (sister with "gallbladder  duct" cancer)   Social History   Socioeconomic History  . Marital status: Widowed    Spouse name: Not on file  . Number of children: Not on file  . Years of education: Not on file  . Highest education level: Not on file  Occupational History  . Not on file  Social Needs  . Financial resource strain: Not on file  . Food insecurity:    Worry: Never true    Inability: Never true  . Transportation needs:    Medical: No    Non-medical: No  Tobacco Use  . Smoking status: Current Every Day Smoker    Packs/day: 0.33    Years: 43.00    Pack years: 14.19    Types: Cigarettes  . Smokeless tobacco: Never Used  Substance and Sexual Activity  . Alcohol use: Yes    Comment: wine or liquor nightly.  Cranberry and vodka or Moscato  . Drug use: No  . Sexual activity: Not on file  Lifestyle  . Physical activity:    Days per week: Not on file    Minutes per session: Not on file  . Stress: Only a little  Relationships  . Social connections:  Talks on phone: Not on file    Gets together: Not on file    Attends religious service: Not on file    Active member of club or organization: Not on file    Attends meetings of clubs or organizations: Not on file    Relationship status: Not on file  . Intimate partner violence:    Fear of current or ex partner: No    Emotionally abused: No    Physically abused: No    Forced sexual activity: Not on file  Other Topics Concern  . Not on file  Social History Narrative   Has been together with female partner for 27 years.   He does not smoke.     She watches her grandson she cares for --he is autistic       Review of Systems     Objective:   Physical Exam NAD HEENT:  PERRL, EOMI, discs sharp, TMs pearly gray, throat without injection. Neck:  Supple, No adenopathy, no thyromegaly Chest:  CTA Breasts:  No skin dimpling, focal mass, nipple discharge or axillary adenopathy CV:  RRR with normal S1 and S2, No S3, S4, or murmur.  Radial and  DP pulses normal and equal Abd:  S, NT, No HSM or mass, + BS Skin:  Scattered areas of hypopigmentation over ears, neck, chest in differing shapes.  No clear demarcation of hypopigmented areas from normally pigmented areas.       Assessment & Plan:  1.  Essential Hypertension:  Start Amlodipine 5 mg daily with follow up in 1 week for bp and pulse check. To work on lifestyle changes with diet and physical activity.  2.  Pigment Loss:  Feel this is likely post inflammatory hypopigmentation, but will have dermatology take a look as well.    3.  Family history of what sounds like differing GI cancers:  Stool cards.  CBC in February was normal.  She is to confirm the diagnosis of differing cancers with her family. CMP and CBC normal in February. Discussed smoking cessation to reduce risk as well.  4.  HM:  Mammogram, Tdap  5.  Tobacco Abuse:  Discussed options.  Nicotine gum likely beneficial for her current use.

## 2017-08-20 NOTE — Progress Notes (Signed)
SW Intern [facility]. Almena did not report any symptoms of depression but stated that she had minimal stress due to her physical health. Blanche did not have any notable social determinants of health needs in the past 12 months. SWI gave information on counseling and case management services if ever desired in the future and left contact information of licensed clinical social worker Forensic scientistatosha Knight at [facility]. No follow-up needed.

## 2017-08-20 NOTE — Patient Instructions (Signed)
Drink a glass of water before every meal Drink 6-8 glasses of water daily Eat three meals daily Eat a protein and healthy fat with every meal (eggs,fish, chicken, [place]key and limit red meats) Eat 5 servings of vegetables daily, mix the colors Eat 2 servings of fruit daily with skin, if skin is edible Use smaller plates Put food/utensils down as you chew and swallow each bite Eat at a table with friends/family at least once daily, no TV Do not eat in front of the TV  Recent studies show that people who consume all of their calories in a 12 hour period lose weight more efficiently.  For example, if you eat your first meal at 7:00 a.m., your last meal of the day should be completed by 7:00 p.m.  Let me know what you would like to do about stopping your smoking.  Patches, gum, lozenges, medication are all on the table.

## 2017-08-27 ENCOUNTER — Ambulatory Visit (INDEPENDENT_AMBULATORY_CARE_PROVIDER_SITE_OTHER): Payer: Self-pay

## 2017-08-27 VITALS — BP 150/100 | HR 80

## 2017-08-27 DIAGNOSIS — I10 Essential (primary) hypertension: Secondary | ICD-10-CM

## 2017-08-27 DIAGNOSIS — Z1211 Encounter for screening for malignant neoplasm of colon: Secondary | ICD-10-CM

## 2017-08-27 LAB — POC HEMOCCULT BLD/STL (HOME/3-CARD/SCREEN)
FECAL OCCULT BLD: NEGATIVE
FECAL OCCULT BLD: NEGATIVE
FECAL OCCULT BLD: NEGATIVE

## 2017-08-27 NOTE — Progress Notes (Signed)
Per Dr. Delrae AlfredMulberry have patient to increase amlodipine to 10 mg. Have patient double up on what she has at home then call and we will send in new Rx for the 10 mg. Left message to call in the am to get new directions

## 2017-09-03 ENCOUNTER — Other Ambulatory Visit (INDEPENDENT_AMBULATORY_CARE_PROVIDER_SITE_OTHER): Payer: Self-pay

## 2017-09-03 VITALS — BP 150/98 | HR 86

## 2017-09-03 DIAGNOSIS — I10 Essential (primary) hypertension: Secondary | ICD-10-CM

## 2017-09-03 MED ORDER — AMLODIPINE BESYLATE 10 MG PO TABS: 10.0000 mg | ORAL_TABLET | Freq: Every day | ORAL | 11 refills | Status: DC

## 2017-09-03 NOTE — Progress Notes (Signed)
Per Dr. Delrae AlfredMulberry have patient to come back in 2 weeks for BP check. No changes in meds at this time. Left message for patient to call to set up BP check in 2 weeks. New Rx sent to pharmacy

## 2017-09-17 ENCOUNTER — Ambulatory Visit: Payer: Self-pay

## 2017-09-17 ENCOUNTER — Ambulatory Visit
Admission: RE | Admit: 2017-09-17 | Discharge: 2017-09-17 | Disposition: A | Discharge status: Home / Self Care | Payer: No Typology Code available for payment source | Source: Ambulatory Visit | Attending: Internal Medicine | Admitting: Internal Medicine

## 2017-09-17 DIAGNOSIS — Z1239 Encounter for other screening for malignant neoplasm of breast: Secondary | ICD-10-CM

## 2017-09-17 IMAGING — MG DIGITAL SCREENING BILATERAL MAMMOGRAM WITH TOMO AND CAD
8 series · 9 of 24 positions shown · non-contrast
Comparison: Previous exam(s).

CLINICAL DATA: Screening.

EXAM:
DIGITAL SCREENING BILATERAL MAMMOGRAM WITH TOMO AND CAD

[L CC synth-2D]
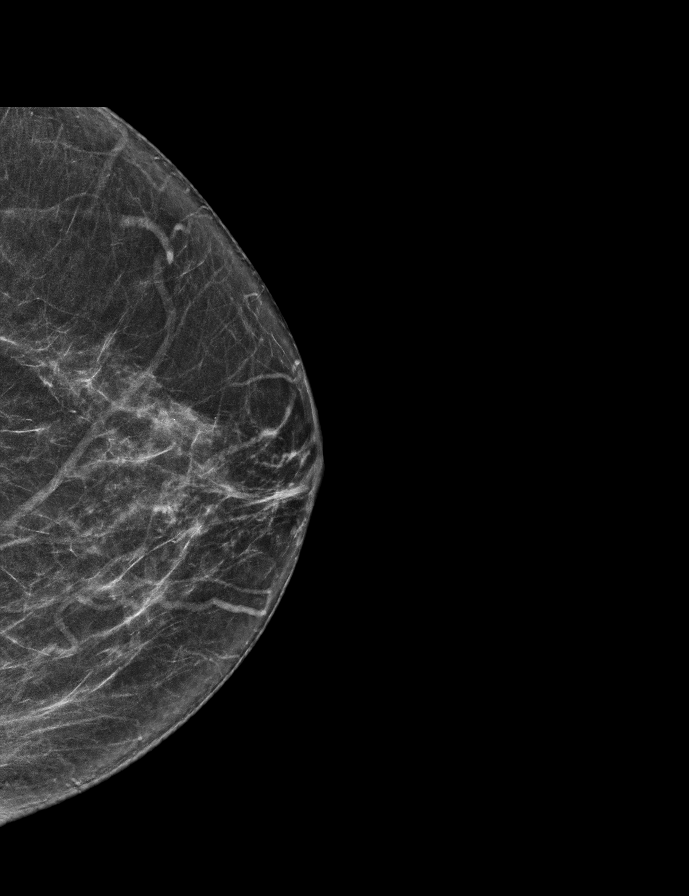

[L MLO synth-2D]
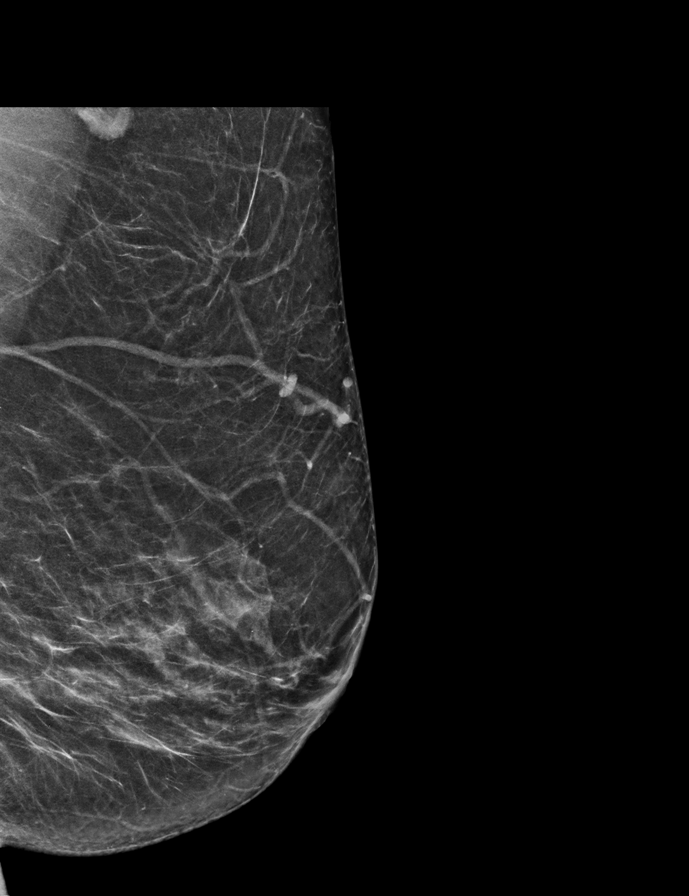

[R CC synth-2D]
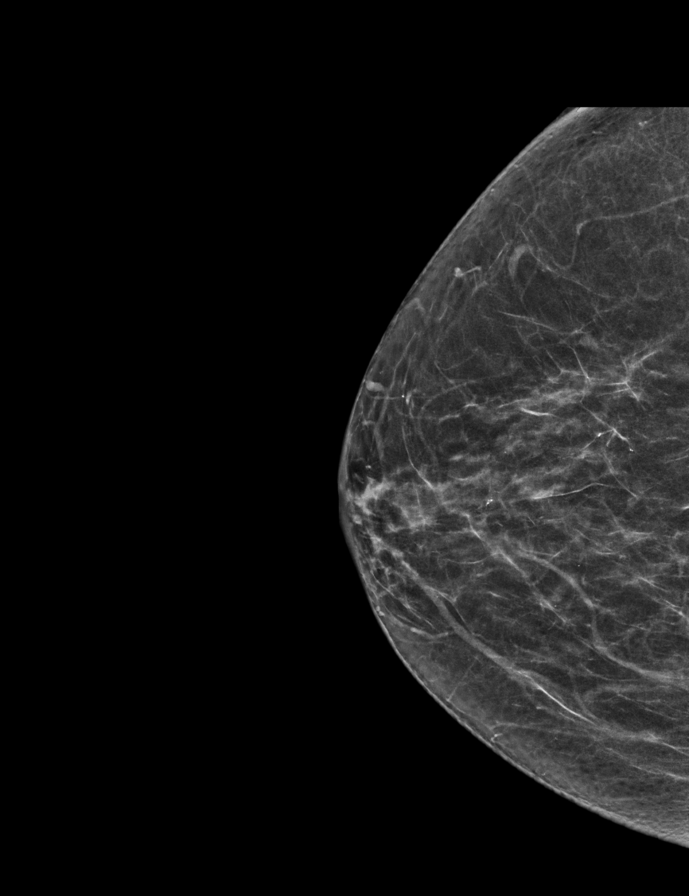

[R MLO synth-2D]
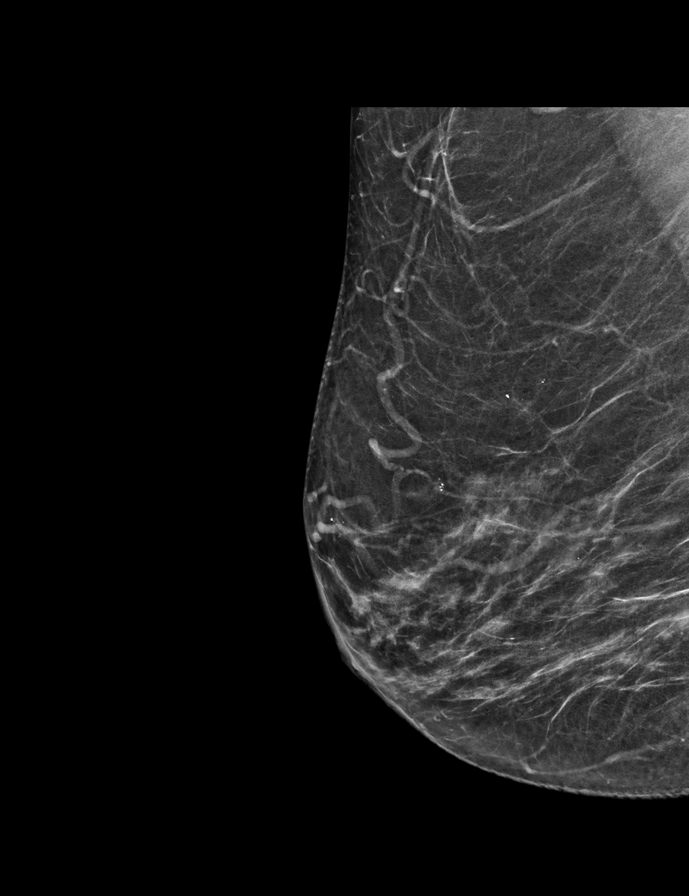

[L CC tomo · 2 of 58 frames shown]
[frame 19/58]
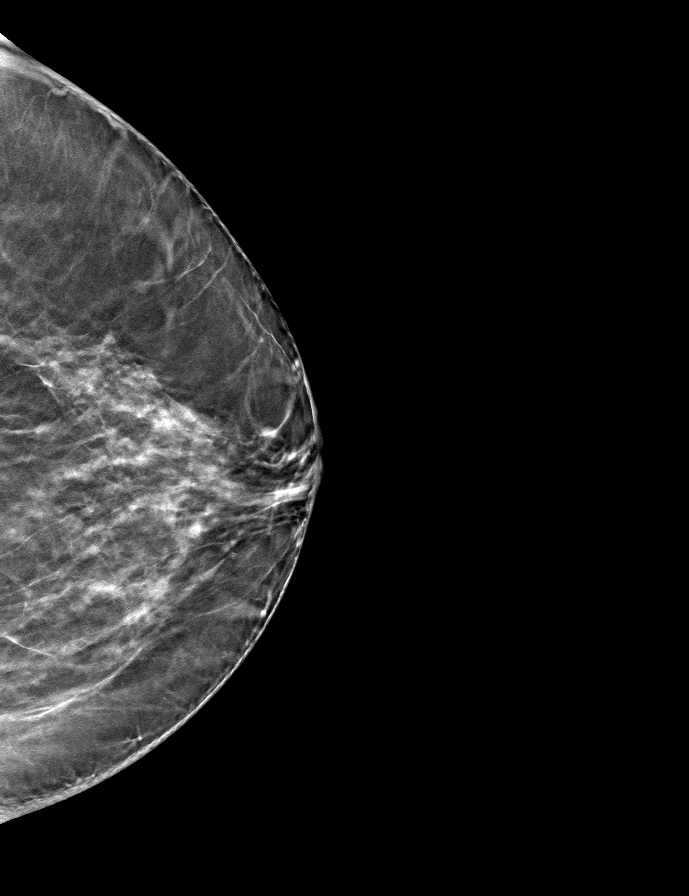
[frame 29/58]
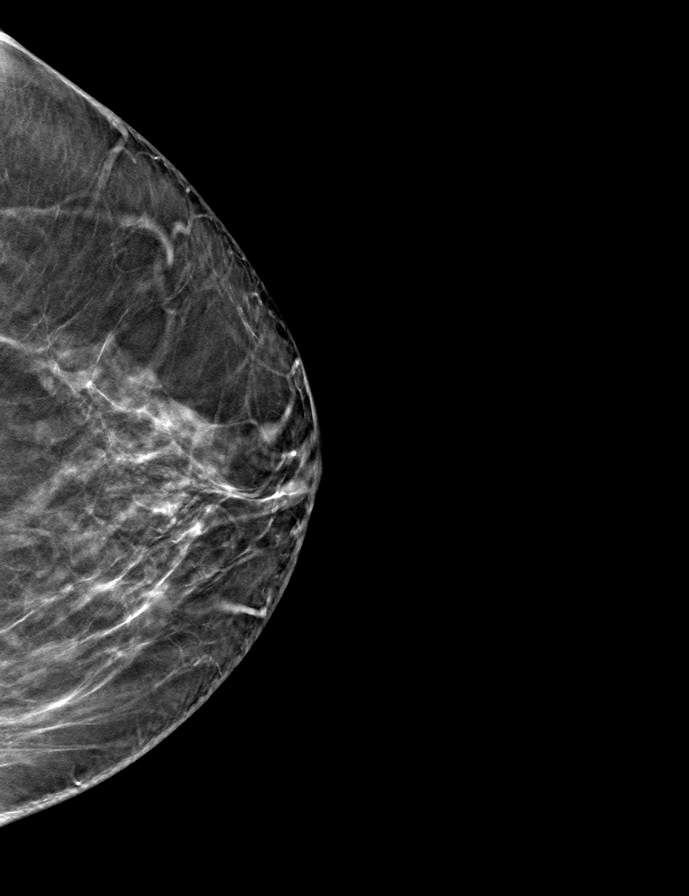

[R MLO tomo · tomo slice 31/60.0]
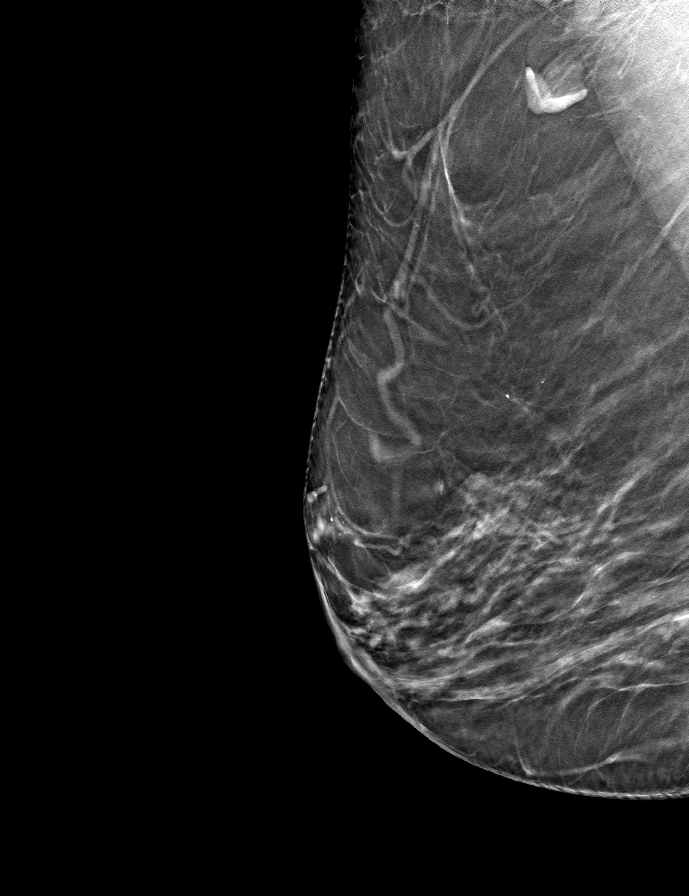

[R CC tomo · tomo slice 27/54.0]
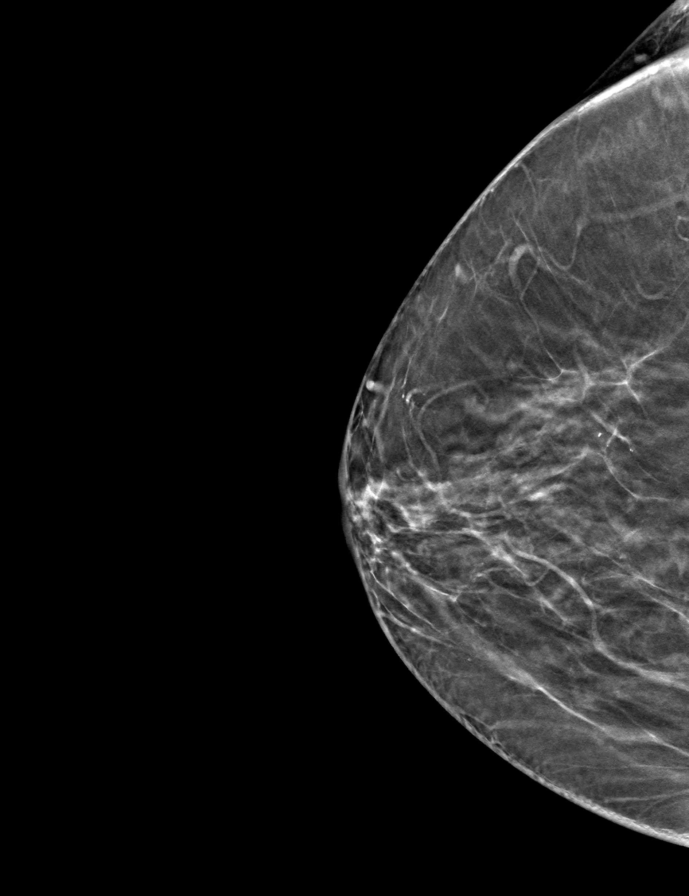

[L MLO tomo · tomo slice 31/62.0]
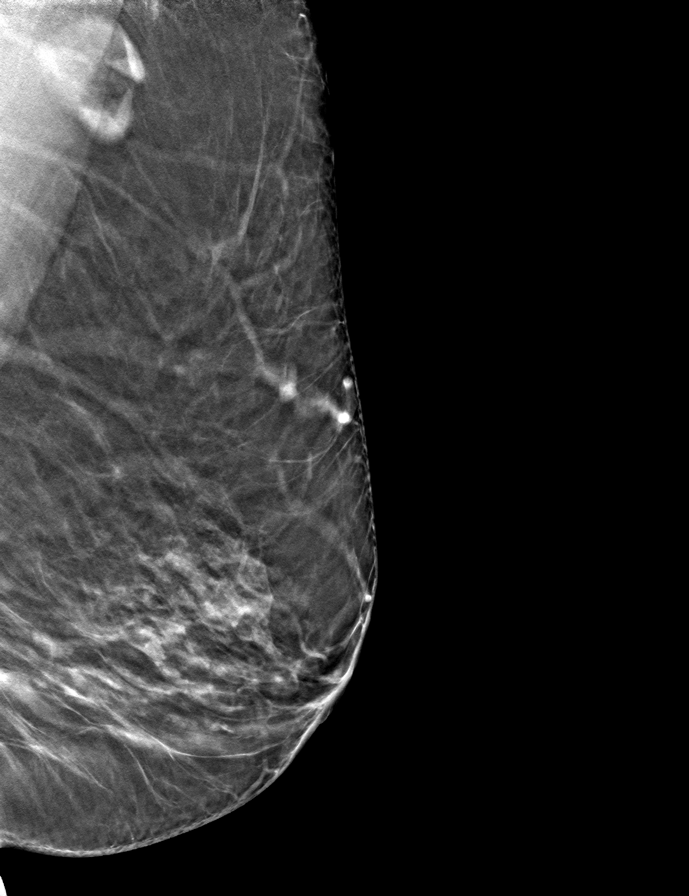

[9 of 24 positions shown; findings below may reference images not displayed]

ACR Breast Density Category b: There are scattered areas of
fibroglandular density.
FINDINGS: There are no findings suspicious for malignancy. Images were
processed with CAD.
IMPRESSION: No mammographic evidence of malignancy. A result letter of this
screening mammogram will be mailed directly to the patient.

RECOMMENDATION:
Screening mammogram in one year. (Code:[TQ])

BI-RADS CATEGORY  1: Negative.

## 2017-09-19 DIAGNOSIS — L819 Disorder of pigmentation, unspecified: Secondary | ICD-10-CM | POA: Insufficient documentation

## 2017-09-19 DIAGNOSIS — I10 Essential (primary) hypertension: Secondary | ICD-10-CM | POA: Insufficient documentation

## 2017-09-19 DIAGNOSIS — Z809 Family history of malignant neoplasm, unspecified: Secondary | ICD-10-CM | POA: Insufficient documentation

## 2017-09-24 ENCOUNTER — Ambulatory Visit: Payer: Self-pay

## 2017-09-24 VITALS — BP 142/88 | HR 84

## 2017-09-24 DIAGNOSIS — I10 Essential (primary) hypertension: Secondary | ICD-10-CM

## 2017-09-24 NOTE — Progress Notes (Signed)
Patient BP slowly coming down. Per Dr. Delrae Alfred have patient to continue current dose of medication and keep follow up with her in June. Spoke with patient encouraged her to watch her diet and physical activity. Patient verbalized understanding.

## 2017-10-08 ENCOUNTER — Telehealth: Payer: Self-pay | Admitting: Internal Medicine

## 2017-10-08 NOTE — Telephone Encounter (Signed)
Left detailed message for patient. Can pick up Rx for $4 this time.

## 2017-10-08 NOTE — Telephone Encounter (Signed)
Patient called and stated that she has a price increase for her amLODipine(NORVAS) 10 mg. Tablet.  Price has changed from $4.00 to $23.00.

## 2017-10-22 ENCOUNTER — Ambulatory Visit: Payer: Self-pay | Admitting: Internal Medicine

## 2017-10-22 ENCOUNTER — Encounter: Payer: Self-pay | Admitting: Internal Medicine

## 2017-10-22 VITALS — BP 138/88 | HR 78 | Resp 12 | Ht 63.25 in | Wt 152.0 lb

## 2017-10-22 DIAGNOSIS — I10 Essential (primary) hypertension: Secondary | ICD-10-CM

## 2017-10-22 DIAGNOSIS — L910 Hypertrophic scar: Secondary | ICD-10-CM

## 2017-10-22 DIAGNOSIS — K029 Dental caries, unspecified: Secondary | ICD-10-CM | POA: Insufficient documentation

## 2017-10-22 DIAGNOSIS — Z809 Family history of malignant neoplasm, unspecified: Secondary | ICD-10-CM

## 2017-10-22 DIAGNOSIS — L819 Disorder of pigmentation, unspecified: Secondary | ICD-10-CM

## 2017-10-22 DIAGNOSIS — Z800 Family history of malignant neoplasm of digestive organs: Secondary | ICD-10-CM

## 2017-10-22 DIAGNOSIS — L210 Seborrhea capitis: Secondary | ICD-10-CM | POA: Insufficient documentation

## 2017-10-22 MED ORDER — TRIAMCINOLONE ACETONIDE 0.025 % EX OINT: TOPICAL_OINTMENT | CUTANEOUS | 2 refills | Status: DC

## 2017-10-22 MED ORDER — FLUOCINOLONE ACETONIDE SCALP 0.01 % EX OIL: TOPICAL_OIL | CUTANEOUS | 4 refills | Status: DC

## 2017-10-22 NOTE — Patient Instructions (Signed)
Tobacco Cessation:   1800QUITNOW or [phone], the former for support and possibly free nicotine patches/gum and support; the latter for [facility] Smoking cessation class. Get rid of all smoking supplies:  Cigarettes, lighters, ashtrays--no stashes just in case at home if you are serious.  For Nicotine gum:  When you feel need for smoking:  Place nicotine gum in mouth and chew until juicy, then stick between gum and cheek.  You will absorb the nicotine from your mouth.   Do not aggressively chew gum. Spit gum out in garbage once you feel you are done with it. 

## 2017-10-22 NOTE — Addendum Note (Signed)
Addended by: Marcene DuosMULBERRY, Hani Campusano M on: 10/22/2017 12:10 PM   Modules accepted: Orders

## 2017-10-22 NOTE — Progress Notes (Addendum)
Subjective:    Patient ID: Jacqueline Fox, female    DOB: 08/11/1955, 62 y.o.   MRN: [id number]  HPI   1.  Essential Hypertension:  Amlodipine 10 mg daily.  No problems with med.    2.  Family history of GI cancers:  Father diagnosed with colon cancer in his 6570s.  Sister's daughter diagnosed at age 60 with same. Another sister (not the niece's mother above) diagnosed with "gallbladder cancer" Father and 3 brothers with history of prostate cancer. Youngest age of diagnosis for this was a brother in his 3650s.  All treated successfully. She has never had a colonoscopy. She thinks her niece did go through genetic testing/counseling.  She is not sure what was found. No one else with genetic testing she is aware of in family.  3.  Skin issues:  Flaking and itching of scalp, ears, face and areas of chest for a little over 1 year.  She just uses Vaseline on ears.   Cannot recall a change in hair products other than stopping her hair relaxer before this started, but she has tried all sorts of other products since this started.    Current Meds  Medication Sig  . acetaminophen (TYLENOL) 500 MG tablet Take 1 tablet (500 mg total) by mouth every 6 (six) hours as needed for pain.  Marland Kitchen. amLODipine (NORVASC) 10 MG tablet Take 1 tablet (10 mg total) by mouth daily.  Marland Kitchen. aspirin-acetaminophen-caffeine (EXCEDRIN MIGRAINE) 250-250-65 MG tablet Take 1 tablet by mouth every 6 (six) hours as needed for headache.    No Known Allergies    Review of Systems     Objective:   Physical Exam  NAD HEENT:  PERRL, EOMI, throat without injection.  Significant dental decay. Neck:  Supple, No adenopathy, no thyromegaly or thyroid mass. Chest:  CTA CV:  RRR without murmur or rub.  Radial and DP pulses normal and equal LE:  No edema Skin:  Diffuse dryness and flaking of scalp with areas of "pink" and loss of pigmentation in areas of continued inflammation.  Some areas with keloid formation as well.  This  particularly involves her nape of neck area within hairline, external ears and external canals with flaking.  Scattered areas of face (mainly nasolabial areas) and anterior neck/chest.         Assessment & Plan:  1.  Essential Hypertension:  Much improved with Amlodipine 10 mg daily.  Would like to see in 120/70 range.  To continue to work on lifestyle changes to bring down.  2.  Tobacco Abuse:  Would like her to get started on Nicotine gum as previously discussed.  Concerned with her paternal side family history of GI/breast and prostate cancer.  3.  Paternal history of cancer:  Need to look into whether can get genetic counseling and whether should have testing.  Also will see about setting up for colonoscopy through [facility]. [facility]--patient also had bloody stool and thickening of descending colon wall back in 06/2017 ED visit.  Her symptoms resolved with treatment of colitis, but with family history and this finding, very concerned she has not had a colonoscopy. Also contacted Maylon CosKaren Powell [facility].  She has the ability to obtain genetic testing, possible Lynch syndrome, etc at no cost to patient if they meet financial criteria.  4.  Skin concerns with what is likely Seborrhea vs. Eczema involving scalp, ears, face and chest with post inflammatory  hypopigmentation and keloid formation.   Recommend trying Nizoral shampoo at least twice weekly Triamcinolone ointment for ears, face chest twice daily and to avoid using qtips in ear canals. Dermasmooth scalp oil to use nightly. Dermatology referral.

## 2017-10-24 ENCOUNTER — Encounter: Payer: Self-pay | Admitting: Genetic Counselor

## 2017-10-27 NOTE — Progress Notes (Signed)
Patient has appointment scheduled with Dr. Lowell GuitarPowell for 11/19/17 @ 2 pm. Patient is aware.

## 2017-11-10 ENCOUNTER — Encounter: Payer: Self-pay | Admitting: Internal Medicine

## 2017-11-10 ENCOUNTER — Ambulatory Visit: Payer: Self-pay | Admitting: Internal Medicine

## 2017-11-10 VITALS — BP 130/86 | HR 60 | Resp 12 | Ht 63.25 in | Wt 154.0 lb

## 2017-11-10 DIAGNOSIS — L52 Erythema nodosum: Secondary | ICD-10-CM

## 2017-11-10 NOTE — Patient Instructions (Signed)
Try Eucerin Cream for Eczema Relief instead of vaseline

## 2017-11-10 NOTE — Progress Notes (Signed)
Subjective:    Patient ID: Jacqueline Fox, female    DOB: 1956-03-08, 62 y.o.   MRN: [id number]  HPI   Ate blue gill and crappy pieces of fried fish her friend caught last Wednesday, 5 days ago.  The next day, developed red raised flat lesions on arms and 2 days later on legs.  Most of involvement on lower legs below knees and arms below elbows, though with some mild involvement above those joints.  No involvement of torso. Itchy, but more tender than itchy.   She has not developed any new lesions from what she can tell since 2 days after eating the fish. She did start taking 50 mg Benadryl on the second day of the rash.  She took maybe 4 times that day. She does admit to eating [place] fried fish on Friday.  She has not noted a worsening of the skin involvement again since before she ate the whiting. No shortness of breath, swelling in airway or throat.  No problems swallowing.  Had something similar just involving lower legs about 2 years ago.  She does not recall if she had eaten fish before.  She states the lesions were more painful She does not recall any other new exposures prior to this rash. She does not remember URI symptoms. She noted fatigue when she noted the spots starting on her arms that she related to the heat outside. She does have aching in her shins when she stands and bears weight. No real joint complaints. No fevers. No recent exposure to antibiotics or any new medications No family history of sarcoidosis. No exposure to anyone with TB No diarrhea, abdominal pain or blood in stool. No family history of IBD.  Current Meds  Medication Sig  . acetaminophen (TYLENOL) 500 MG tablet Take 1 tablet (500 mg total) by mouth every 6 (six) hours as needed for pain.  Marland Kitchen amLODipine (NORVASC) 10 MG tablet Take 1 tablet (10 mg total) by mouth daily.  Marland Kitchen aspirin-acetaminophen-caffeine (EXCEDRIN MIGRAINE) 250-250-65 MG tablet Take 1 tablet by mouth every 6 (six) hours as needed for  headache.  . Fluocinolone Acetonide Scalp (DERMA-SMOOTHE/FS SCALP) 0.01 % OIL Apply to scalp at bedtime as per instructions  . triamcinolone (KENALOG) 0.025 % ointment Apply scant amount to affected areas once daily as needed.    No Known Allergies  Review of Systems     Objective:   Physical Exam NAD HEENT:  PERRL, EOMI, conjunctivae without injection.   Throat without injection, no exudate. Neck:  Supple, No adenopathy Chest:  CTA CV:  RRR without murmur or rub. Radial and DP pulses normal and equal Abd:  S, NT, No HSM or mass, + bs Skin:  Raised flat deep red lesions very numerous on lower legs with just a few lesions above knees.   Legs appear lumpy with the lesions.  Lesser involvement of lower arms and just a couple of lesions above elbows.  No findings of lesions on trunk or face.        Assessment & Plan:  1.  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  Discussed how the color often changes to look like bruising and then flaking of the lesions. Aleve 2 tabs twice daily with food for now.  If does not control discomfort, will consider potassium iodide or prednisone.  Check ESR, ANA, CBC, ASO titre and CXR.   Unable to clarify any inciting factor at this time. Discussed do not feel  this is an allergy to fish.

## 2017-11-11 LAB — CBC WITH DIFFERENTIAL/PLATELET
BASOS: 0 %
Basophils Absolute: 0 10*3/uL (ref 0.0–0.2)
EOS (ABSOLUTE): 0 10*3/uL (ref 0.0–0.4)
EOS: 1 %
HEMOGLOBIN: 11.4 g/dL (ref 11.1–15.9)
Hematocrit: 34.7 % (ref 34.0–46.6)
IMMATURE GRANULOCYTES: 0 %
Immature Grans (Abs): 0 10*3/uL (ref 0.0–0.1)
LYMPHS ABS: 1.7 10*3/uL (ref 0.7–3.1)
Lymphs: 33 %
MCH: 30 pg (ref 26.6–33.0)
MCHC: 32.9 g/dL (ref 31.5–35.7)
MCV: 91 fL (ref 79–97)
MONOCYTES: 6 %
Monocytes Absolute: 0.3 10*3/uL (ref 0.1–0.9)
Neutrophils Absolute: 3.1 10*3/uL (ref 1.4–7.0)
Neutrophils: 60 %
PLATELETS: 302 10*3/uL (ref 150–450)
RBC: 3.8 x10E6/uL (ref 3.77–5.28)
RDW: 12.7 % (ref 12.3–15.4)
WBC: 5.1 10*3/uL (ref 3.4–10.8)

## 2017-11-11 LAB — SEDIMENTATION RATE: SED RATE: 58 mm/h — AB (ref 0–40)

## 2017-11-11 LAB — ANTISTREPTOLYSIN O TITER: ASO: 55 [IU]/mL (ref 0.0–200.0)

## 2017-11-11 LAB — ANA: ANA: POSITIVE — AB

## 2017-11-19 ENCOUNTER — Encounter: Payer: Self-pay | Admitting: Genetic Counselor

## 2017-11-19 ENCOUNTER — Inpatient Hospital Stay: Payer: Self-pay

## 2017-11-19 ENCOUNTER — Inpatient Hospital Stay: Payer: Self-pay | Attending: Genetic Counselor | Admitting: Genetic Counselor

## 2017-11-19 DIAGNOSIS — Z803 Family history of malignant neoplasm of breast: Secondary | ICD-10-CM

## 2017-11-19 DIAGNOSIS — Z800 Family history of malignant neoplasm of digestive organs: Secondary | ICD-10-CM

## 2017-11-19 DIAGNOSIS — Z7183 Encounter for nonprocreative genetic counseling: Secondary | ICD-10-CM

## 2017-11-19 DIAGNOSIS — Z8042 Family history of malignant neoplasm of prostate: Secondary | ICD-10-CM

## 2017-11-19 NOTE — Progress Notes (Signed)
REFERRING PROVIDER: Mack Hook, MD [place],  [id number]  PRIMARY PROVIDER:  Mack Hook, MD  PRIMARY REASON FOR VISIT:  1. Family history of colon cancer   2. Family history of breast cancer   3. Family history of prostate cancer      HISTORY OF PRESENT ILLNESS:   Jacqueline Fox, a 62 y.o. female, was seen for a [facility] cancer genetics consultation at the request of Dr. Amil Amen due to a family history of cancer.  Jacqueline Fox presents to clinic today to discuss the possibility of a hereditary predisposition to cancer, genetic testing, and to further clarify her future cancer risks, as well as potential cancer risks for family members.   Jacqueline Fox is a 62 y.o. female with no personal history of cancer.  She has a strong family history of cancer which is suggestive of Lynch syndrome.    CANCER HISTORY:   No history exists.     HORMONAL RISK FACTORS:  Menarche was at age 80-13.  First live birth at age 68.  OCP use for approximately <5 years.  Ovaries intact: yes.  Hysterectomy: no.  Menopausal status: postmenopausal.  HRT use: 0 years. Colonoscopy: no; not examined. Mammogram within the last year: yes. Number of breast biopsies: 0. Up to date with pelvic exams:  no. Any excessive radiation exposure in the past:  no  Past Medical History:  Diagnosis Date  . Family history of breast cancer   . Family history of colon cancer   . Family history of prostate cancer   . Hypertension     No past surgical history on file.  Social History   Socioeconomic History  . Marital status: Widowed    Spouse name: Not on file  . Number of children: Not on file  . Years of education: Not on file  . Highest education level: Not on file  Occupational History  . 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 Financial resource strain: Not on file  . Food insecurity:    Worry: Never true    Inability:  Never true  . Transportation needs:    Medical: No    Non-medical: No  Tobacco Use  . Smoking status: Current Every Day Smoker    Packs/day: 0.33    Years: 43.00    Pack years: 14.19    Types: Cigarettes  . Smokeless tobacco: Never Used  Substance and Sexual Activity  . Alcohol use: Yes    Comment: wine or liquor nightly.  Cranberry and vodka or Moscato  . Drug use: No  . Sexual activity: Not on file  Lifestyle  . Physical activity:    Days per week: Not on file    Minutes per session: Not on file  . Stress: Only a little  Relationships  . Social connections:    Talks on phone: Not on file    Gets together: Not on file    Attends religious service: Not on file    Active member of club or organization: Not on file    Attends meetings of clubs or organizations: Not on file    Relationship status: Not on file  Other Topics Concern  . Not on file  Social History Narrative   Has been together with female partner for 27 years.   He does not smoke.     She watches her grandson she cares for --he is autistic.   Daughter  and another grandchild sometimes live with her.     FAMILY HISTORY:  We obtained a detailed, 4-generation family history.  Significant diagnoses are listed below: Family History  Problem Relation Age of Onset  . Diabetes Mother   . Heart disease Mother        CHF  . Hypertension Mother   . Kidney disease Mother   . Arthritis Mother   . Dementia Mother   . Cancer Father 72       colon and prostate  . Cancer Sister 42       ?cholangiocarcinoma  . Cancer Brother 68       lung--smoker  also, prostate CA when 68-54 yo  . Cancer Brother 75       prostate  . Cancer Brother 38       Prostate--treated successfuly  . Cancer Other 25       colon--sister's daughter (sister with "gallbladder duct" cancer)  . Cancer Paternal Aunt 79       Breast Cancer  . Cancer Paternal Aunt        Bone cancer  . Stroke Paternal Grandmother   . Prostate cancer Cousin         mat first cousin    The patient has two sons and two daughters, all who are cancer free.  She has three brothers and one sister.  All three brothers have been diagnosed with prostate cancer, and one also has lung cancer. The sister had cancer of her gallbladder and died at 18.  This sister has a daughter who had colon cancer at 1.  The patient's father died at 24 and her mother is alive.  The patient's mother has not had cancer.  She had 17 siblings, all who are now deceased, and none had cancer.  Two maternal cousins have had cancer, one with prostate and the other with an unknown form of cancer.  The maternal grandparents died of natural causes.  The patient's father had colon cancer at 74 and may have had prostate cancer.  He died at 73.  He had five sisters and a brother.  One sister has breast cancer and one sister died of bone cancer.  There is a paternal cousin with an unknown form of cancer.  The paternal grandparents died of non cancer related issues.  Jacqueline Fox is aware of possible previous family history of genetic testing for hereditary cancer risks. Specifically, her niece with colon cancer she feels underwent genetic testing.  Patient's maternal ancestors are of [place] American and [place] descent, and paternal ancestors are of [place] American descent. There is no reported Ashkenazi Jewish ancestry. There is no known consanguinity.  GENETIC COUNSELING ASSESSMENT: Jacqueline Fox is a 62 y.o. female with a family history of breast, colon and prostate cancer which is somewhat suggestive of a hereditary condition like Lynch syndrome and predisposition to cancer. We, therefore, discussed and recommended the following at today's visit.   DISCUSSION: We discussed that about 5-6% of colon cancer, 5-10% of breast cancer and 10-15% of prostate cancer is hereditary.  Each of these cancers can be caused by changes in one or more genes.  The most common cause of colon cancer is due to Lynch  syndrome.  Families with Lynch syndrome are more likely to have specific clusters of cancer including colon, gynecological cancers and other GI cancers.  Prostate and breast cancer are also common in Lynch syndrome.  The most common cause of breast and prostate cancer is  due to hereditary changes in BRCA genes.   Ms. Hinderliter' niece, she feels, has undergone genetic testing.  It would be important to get ahold of those results so that if her niece tested positive for a hereditary condition, that we can ensure that we are testing Jacqueline Fox for that as well.  Jacqueline Fox understands this as well.  We reviewed the characteristics, features and inheritance patterns of hereditary cancer syndromes. We also discussed genetic testing, including the appropriate family members to test, the process of testing, insurance coverage and turn-around-time for results. We discussed the implications of a negative, positive and/or variant of uncertain significant result. We recommended Jacqueline Fox pursue genetic testing for the Multi cancer gene panel.   Based on Jacqueline Fox's family history of cancer, she meets medical criteria for genetic testing. Jacqueline Fox does not carry medical insurance.  The testing lab will offer complimentary insurance if the patient meets their financial criteria.  The patients was asked about household size and income.  Based on her answers, she meets criteria.  Additionally, she does not work and therefore does not file taxes.  We will discuss with the lab what type of documentation they need in order to proceed with testing.  In the meantime we will draw blood and hold it.   PLAN: After considering the risks, benefits, and limitations, Jacqueline Fox  provided informed consent to pursue genetic testing and the blood sample was sent to [facility] for analysis of the Multi cancer panel. Results should be available within approximately 2-3 weeks' time, at which point they will be disclosed by  telephone to Jacqueline Fox, as will any additional recommendations warranted by these results. Jacqueline Fox will receive a summary of her genetic counseling visit and a copy of her results once available. This information will also be available in Epic. We encouraged Jacqueline Fox to remain in contact with cancer genetics annually so that we can continuously update the family history and inform her of any changes in cancer genetics and testing that may be of benefit for her family. Jacqueline Fox questions were answered to her satisfaction today. Our contact information was provided should additional questions or concerns arise.  Lastly, we encouraged Jacqueline Fox to remain in contact with cancer genetics annually so that we can continuously update the family history and inform her of any changes in cancer genetics and testing that may be of benefit for this family.   Ms.  Fox questions were answered to her satisfaction today. Our contact information was provided should additional questions or concerns arise. Thank you for the referral and allowing [place] to share in the care of your patient.   Angelissa Supan P. Florene Glen, [place], [facility] Certified Genetic Counselor [email or web address] phone: [phone]  The patient was seen for a total of 45 minutes in face-to-face genetic counseling.  This patient was discussed with Drs. Magrinat, Lindi Adie and/or Burr Medico who agrees with the above.    _______________________________________________________________________ For Office Staff:  Number of people involved in session: 1 Was an Intern/ student involved with case: no

## 2017-11-21 ENCOUNTER — Ambulatory Visit: Payer: Self-pay | Attending: Family Medicine | Admitting: *Deleted

## 2017-11-21 DIAGNOSIS — L309 Dermatitis, unspecified: Secondary | ICD-10-CM

## 2017-11-21 NOTE — Progress Notes (Signed)
Concerns about discoloration on right ear, face and head.

## 2017-11-27 ENCOUNTER — Encounter: Payer: Self-pay | Admitting: Genetic Counselor

## 2017-11-27 ENCOUNTER — Ambulatory Visit: Payer: Self-pay | Admitting: Genetic Counselor

## 2017-11-27 DIAGNOSIS — Z1379 Encounter for other screening for genetic and chromosomal anomalies: Secondary | ICD-10-CM

## 2017-11-27 HISTORY — DX: Encounter for other screening for genetic and chromosomal anomalies: Z13.79

## 2017-11-27 NOTE — Progress Notes (Signed)
HPI:  [name] was previously seen in the [place] clinic due to a family history of cancer and concerns regarding a hereditary predisposition to cancer. Please refer to our prior cancer genetics clinic note for more information regarding [name]'s medical, social and family histories, and our assessment and recommendations, at the time. [name] recent genetic test results were disclosed to her, as were recommendations warranted by these results. These results and recommendations are discussed in more detail below.  CANCER HISTORY:   No history exists.    FAMILY HISTORY:  We obtained a detailed, 4-generation family history.  Significant diagnoses are listed below: Family History  Problem Relation Age of Onset  . Diabetes Mother   . Heart disease Mother        CHF  . Hypertension Mother   . Kidney disease Mother   . Arthritis Mother   . Dementia Mother   . Cancer Father 49       colon and prostate  . Cancer Sister 63       ?cholangiocarcinoma  . Cancer Brother 36       lung--smoker  also, prostate CA when 40-54 yo  . Cancer Brother 57       prostate  . Cancer Brother 10       Prostate--treated successfuly  . Cancer Other 81       colon--sister's daughter (sister with "gallbladder duct" cancer)  . Cancer Paternal Aunt 108       Breast Cancer  . Cancer Paternal Aunt        Bone cancer  . Stroke Paternal Grandmother   . Prostate cancer Cousin        mat first cousin    The patient has two sons and two daughters, all who are cancer free.  She has three brothers and one sister.  All three brothers have been diagnosed with prostate cancer, and one also has lung cancer. The sister had cancer of her gallbladder and died at 39.  This sister has a daughter who had colon cancer at 39.  The patient's father died at 64 and her mother is alive.  The patient's mother has not had cancer.  She had 17 siblings, all who are now deceased, and none had cancer.  Two  maternal cousins have had cancer, one with prostate and the other with an unknown form of cancer.  The maternal grandparents died of natural causes.  The patient's father had colon cancer at 78 and may have had prostate cancer.  He died at 77.  He had five sisters and a brother.  One sister has breast cancer and one sister died of bone cancer.  There is a paternal cousin with an unknown form of cancer.  The paternal grandparents died of non cancer related issues.  [name] is aware of possible previous family history of genetic testing for hereditary cancer risks. Specifically, her niece with colon cancer she feels underwent genetic testing.  Patient's maternal ancestors are of [place] and [place] descent, and paternal ancestors are of [place] American descent. There is no reported Ashkenazi Jewish ancestry. There is no known consanguinity.  GENETIC TEST RESULTS: Genetic testing reported out on November 26, 2017 through the Multi-cancer panel found no deleterious mutations.  The Multi-Gene Panel offered by [facility] includes sequencing and/or deletion duplication testing of the following 83 genes: AIP, ALK, APC, ATM, AXIN2,BAP1,  BARD1, BLM, BMPR1A, BRCA1, BRCA2, BRIP1, CASR, CDC73, CDH1, CDK4, CDKN1B, CDKN1C, CDKN2A (p14ARF),  CDKN2A (p16INK4a), CEBPA, CHEK2, CTNNA1, DICER1, DIS3L2, EGFR (c.2369C>T, p.Thr790Met variant only), EPCAM (Deletion/duplication testing only), FH, FLCN, GATA2, GPC3, GREM1 (Promoter region deletion/duplication testing only), HOXB13 (c.251G>A, p.Gly84Glu), HRAS, KIT, MAX, MEN1, MET, MITF (c.952G>A, p.Glu318Lys variant only), MLH1, MSH2, MSH3, MSH6, MUTYH, NBN, NF1, NF2, NTHL1, PALB2, PDGFRA, PHOX2B, PMS2, POLD1, POLE, POT1, PRKAR1A, PTCH1, PTEN, RAD50, RAD51C, RAD51D, RB1, RECQL4, RET, RUNX1, SDHAF2, SDHA (sequence changes only), SDHB, SDHC, SDHD, SMAD4, SMARCA4, SMARCB1, SMARCE1, STK11, SUFU, TERT, TERT, TMEM127, TP53, TSC1, TSC2, VHL, WRN and WT1.    The test report has been  scanned into EPIC and is located under the Molecular Pathology section of the Results Review tab.    We discussed with [name] that since the current genetic testing is not perfect, it is possible there may be a gene mutation in one of these genes that current testing cannot detect, but that chance is small.  We also discussed, that it is possible that another gene that has not yet been discovered, or that we have not yet tested, is responsible for the cancer diagnoses in the family, and it is, therefore, important to remain in touch with cancer genetics in the future so that we can continue to offer [name] the most up to date genetic testing.     CANCER SCREENING RECOMMENDATIONS: This normal result is reassuring and indicates that [name] does not likely have an increased risk of cancer due to a mutation in one of these genes.  We, therefore, recommended  [name] continue to follow the cancer screening guidelines provided by her primary healthcare providers.   An individual's cancer risk and medical management are not determined by genetic test results alone. Overall cancer risk assessment incorporates additional factors, including personal medical history, family history, and any available genetic information that may result in a personalized plan for cancer prevention and surveillance.  RECOMMENDATIONS FOR FAMILY MEMBERS:  Women in this family might be at some increased risk of developing cancer, over the general population risk, simply due to the family history of cancer.  We recommended women in this family have a yearly mammogram beginning at age 40, or 58 years younger than the earliest onset of cancer, an annual clinical breast exam, and perform monthly breast self-exams. Women in this family should also have a gynecological exam as recommended by their primary provider. All family members should have a colonoscopy by age 69.  FOLLOW-UP: Lastly, we discussed with [name] that  cancer genetics is a rapidly advancing field and it is possible that new genetic tests will be appropriate for her and/or her family members in the future. We encouraged her to remain in contact with cancer genetics on an annual basis so we can update her personal and family histories and let her know of advances in cancer genetics that may benefit this family.   Our contact number was provided. [name] questions were answered to her satisfaction, and she knows she is welcome to call us at anytime with additional questions or concerns.   Roma Kayser, MS, [facility] Certified Genetic Counselor [email or web address]

## 2017-12-08 LAB — SPECIMEN STATUS REPORT

## 2017-12-08 LAB — ANTI-SMITH ANTIBODY

## 2017-12-08 LAB — ANTI-DNA ANTIBODY, DOUBLE-STRANDED

## 2017-12-08 LAB — ANTINUCLEAR ANTIBODIES, IFA: ANA TITER 1: NEGATIVE

## 2017-12-15 ENCOUNTER — Ambulatory Visit: Payer: Self-pay | Admitting: Internal Medicine

## 2017-12-15 ENCOUNTER — Encounter: Payer: Self-pay | Admitting: Internal Medicine

## 2017-12-15 VITALS — BP 118/88 | HR 72 | Resp 12 | Ht 63.25 in | Wt 151.0 lb

## 2017-12-15 DIAGNOSIS — L52 Erythema nodosum: Secondary | ICD-10-CM | POA: Insufficient documentation

## 2017-12-15 DIAGNOSIS — Z800 Family history of malignant neoplasm of digestive organs: Secondary | ICD-10-CM

## 2017-12-15 DIAGNOSIS — L819 Disorder of pigmentation, unspecified: Secondary | ICD-10-CM

## 2017-12-15 DIAGNOSIS — Z716 Tobacco abuse counseling: Secondary | ICD-10-CM

## 2017-12-15 DIAGNOSIS — L210 Seborrhea capitis: Secondary | ICD-10-CM

## 2017-12-15 DIAGNOSIS — I10 Essential (primary) hypertension: Secondary | ICD-10-CM

## 2017-12-15 NOTE — Patient Instructions (Signed)
For Nicotine gum:  When you feel need for smoking:  Place nicotine gum in mouth and chew until juicy, then stick between gum and cheek.  You will absorb the nicotine from your mouth.   Do not aggressively chew gum. Spit gum out in garbage once you feel you are done with it.  Tobacco Cessation:   1800QUITNOW or [phone], the former for support and possibly free nicotine patches/gum and support; the latter for [facility] Smoking cessation class. Get rid of all smoking supplies:  Cigarettes, lighters, ashtrays--no stashes just in case at home if you are serious. 

## 2017-12-15 NOTE — Progress Notes (Signed)
   Subjective:    Patient ID: Jacqueline Fox, female    DOB: 1955/08/28, 62 y.o.   MRN: [id number]  HPI   1.  Post inflammatory hypopigmentation:  Was seen by dermatology.  Have not yet seen the record on this.  Patient not sure if was told it was seborrheic dermatitis, though describes what he said to her as likely the diagnosis.  He did confirm her pigment loss was postinflammatory in nature. She was told to not overdo the topical corticosteroid.  She is currently using the Derma Smoothe twice weekly.   Using the Triamcinolone ointment once daily.  2.  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  Her ESR was somewhat elevated in the 50s, which is supportive of the diagnosis.  The lesions did turn violaceous and then dark before resolving in about 2 weeks.  She did not need to to take oral NSAIDS for the discomfort.  She did not get anymore lesions after seen on the first of July.  3.  Family history of colon cancer:  Has an appt Aug 27th to see GI and get colonoscopy.  She will be seen at [facility]  Has appt in September for a complete physical. Had normal mammogram in May. Review of Systems   Current Meds  Medication Sig  . acetaminophen (TYLENOL) 500 MG tablet Take 1 tablet (500 mg total) by mouth every 6 (six) hours as needed for pain.  Marland Kitchen amLODipine (NORVASC) 10 MG tablet Take 1 tablet (10 mg total) by mouth daily.  Marland Kitchen aspirin-acetaminophen-caffeine (EXCEDRIN MIGRAINE) 250-250-65 MG tablet Take 1 tablet by mouth every 6 (six) hours as needed for headache.  . Fluocinolone Acetonide Scalp (DERMA-SMOOTHE/FS SCALP) 0.01 % OIL Apply to scalp at bedtime as per instructions  . triamcinolone (KENALOG) 0.025 % ointment Apply scant amount to affected areas once daily as needed.    No Known Allergies     Objective:   Physical Exam  NAD Skin:  No lesions on lower arms or legs as last visit.  No remaining lesions in same areas. Pinkened areas fading over ear  and scalp and anterior chest.  No flaking in these areas. Lungs: CTA CV: RRR without murmur or rub.  Radial and DP pulses normal and equal LE:  No edema.      Assessment & Plan:  1.  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   Will look for derm records.  2.  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  Discussed at length with patient.  Again, no family history of sarcoidosis.  No family history of autoimmune disorder. Will need to reconsider further evaluation if recurs.  3.  Family history of colon cancer:  Genetics negative for known mutations.  Appreciate Genetic counseling and testing.  With family history, she is at higher risk, however, for colon cancer and has been set up for screening.  4.  Essential Hypertension:  Controlled with Amlodipine  5.  Tobacco Use:  Strongly recommend stopping smoking with family history of cancer.  Again, went of nicotine gum as that is what she is more likely to use to quit.    HM:  Recommend influenza vaccine in Sept and has CPE then as well.

## 2018-01-16 ENCOUNTER — Telehealth: Payer: Self-pay

## 2018-01-16 NOTE — Telephone Encounter (Signed)
Spoke with Jacqueline Fox informed per Jenean Lindau that she spoke with [place] from [facility] and Serena will go ahead and schedule colonoscopy. They will contact patient to set up appointment. Patient has been informed to answer the phone for all calls so she does not miss call to schedule appointment. Patient verbalized understanding.

## 2018-01-19 ENCOUNTER — Other Ambulatory Visit: Payer: Self-pay | Admitting: Gastroenterology

## 2018-01-21 ENCOUNTER — Encounter: Payer: Self-pay | Admitting: Internal Medicine

## 2018-02-10 ENCOUNTER — Encounter (HOSPITAL_COMMUNITY): Payer: Self-pay | Admitting: *Deleted

## 2018-02-10 ENCOUNTER — Other Ambulatory Visit: Payer: Self-pay

## 2018-02-11 ENCOUNTER — Ambulatory Visit: Payer: Self-pay | Admitting: Internal Medicine

## 2018-02-11 ENCOUNTER — Encounter: Payer: Self-pay | Admitting: Internal Medicine

## 2018-02-11 VITALS — BP 128/82 | HR 80 | Resp 12 | Ht 63.25 in | Wt 152.0 lb

## 2018-02-11 DIAGNOSIS — K029 Dental caries, unspecified: Secondary | ICD-10-CM

## 2018-02-11 DIAGNOSIS — M79605 Pain in left leg: Secondary | ICD-10-CM

## 2018-02-11 DIAGNOSIS — Z0000 Encounter for general adult medical examination without abnormal findings: Secondary | ICD-10-CM

## 2018-02-11 DIAGNOSIS — Z124 Encounter for screening for malignant neoplasm of cervix: Secondary | ICD-10-CM

## 2018-02-11 NOTE — Progress Notes (Addendum)
Subjective:    Patient ID: Jacqueline Fox, female    DOB: [birth year], 62 y.o.   MRN: [id number]  HPI   CPE with pap  1.  Pap:  Last pap was in 2005.  Always normal.  No family history of cervical cancer.  2.  Mammogram:  Last 09/17/17 and normal.  Paternal aunt with history of breast cancer, diagnosed in late 61s or early 63s.  3.  Osteoprevention:  Does eat/drink dairy daily.  One serving of 2% milk daily.  Does not really walk much or perform other physical activity.  Would be willing to increase both low fat dairy intake and increase physical activity.    4.  Guaiac Cards:  Going for colonoscopy tomorrow.  5.  Colonoscopy:  Never.  Father and niece with history of colon cancer.  Father diagnosed at age 70 yo and sister's daughter, age 71 at diagnosis  6.  Immunizations:   Immunization History  Administered Date(s) Administered  . Tdap 08/20/2017     7.  Glucose/Cholesterol:  Most recent blood glucose 07/10/17:  95.   Has not had FLP in recent years.    Current Meds  Medication Sig  . amLODipine (NORVASC) 10 MG tablet Take 1 tablet (10 mg total) by mouth daily.  . Fluocinolone Acetonide Scalp (DERMA-SMOOTHE/FS SCALP) 0.01 % OIL Apply to scalp at bedtime as per instructions  . triamcinolone (KENALOG) 0.025 % ointment Apply scant amount to affected areas once daily as needed.    No Known Allergies   Past Medical History:  Diagnosis Date  . Family history of breast cancer   . Family history of colon cancer    Patient has had genetic testing and not found to have genetic predisposition.  Recommendations for cancer screenings as usual per Johney Maine her note 2019 please  . Family history of prostate cancer   . Hypertension     Past Surgical History:  Procedure Laterality Date  . COLONOSCOPY WITH PROPOFOL N/A 02/12/2018   Procedure: COLONOSCOPY WITH PROPOFOL;  Surgeon: Bernette Redbird, MD;  Location: [facility];  Service: Endoscopy;  Laterality: N/A;    Family  History  Problem Relation Age of Onset  . Diabetes Mother   . Heart disease Mother        CHF  . Hypertension Mother   . Kidney disease Mother   . Arthritis Mother   . Dementia Mother   . Cancer Father 38       colon and prostate  . Cancer Sister 40       ?cholangiocarcinoma  . Cancer Brother 79       lung--smoker  also, prostate CA when [age] yo  . Cancer Brother 11       prostate  . Cancer Brother 37       Prostate--treated successfuly  . Cancer Other 3       colon--sister's daughter (sister with "gallbladder duct" cancer)  . Cancer Paternal Aunt 55       Breast Cancer  . Cancer Paternal Aunt        Bone cancer  . Stroke Paternal Grandmother   . Prostate cancer Cousin        mat first cousin    Social History   Socioeconomic History  . Marital status: Media planner    Spouse name: Rogene Houston  . Number of children: 4  . Years of education: 40  . Highest education level: Associate degree: academic program  Occupational History  . 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 Financial resource strain: Not very hard  . Food insecurity:    Worry: Never true    Inability: Never true  . Transportation needs:    Medical: No    Non-medical: No  Tobacco Use  . Smoking status: Current Every Day Smoker    Packs/day: 0.25    Years: 43.00    Pack years: 10.75    Types: Cigarettes  . Smokeless tobacco: Never Used  . Tobacco comment: Not clear if she is ready to quit, despite family history of cancers.  Substance and Sexual Activity  . Alcohol use: Yes    Comment: beer and wine- 4 beers per week , 3 glasses of wine weekly   . Drug use: Yes    Types: Marijuana    Comment: weekends -smokes.  . Sexual activity: Not Currently  Lifestyle  . Physical activity:    Days per week: 0 days    Minutes per session: 0 min  . Stress: Only a little  Relationships  . Social connections:    Talks on phone: More than three times a  week    Gets together: More than three times a week    Attends religious service: More than 4 times per year    Active member of club or organization: Yes    Attends meetings of clubs or organizations: More than 4 times per year    Relationship status: Living with partner  . Intimate partner violence:    Fear of current or ex partner: No    Emotionally abused: No    Physically abused: No    Forced sexual activity: No  Other Topics Concern  . Not on file  Social History Narrative   Has been together with female partner for 27 years (2019).   He does not smoke.     She watches her grandson during summer --he is autistic.   Currently just lives with her partner, Criss Alvine      Review of Systems  Constitutional: Negative for appetite change, fatigue, fever and unexpected weight change.  HENT: Positive for dental problem (cavities). Negative for ear pain, hearing loss, rhinorrhea, sneezing and sore throat.   Eyes: Negative for visual disturbance (Wears glasses.  Last eye check was 1 year ago.).  Respiratory: Negative for cough and shortness of breath.   Cardiovascular: Negative for chest pain, palpitations and leg swelling.  Gastrointestinal: Negative for abdominal pain, blood in stool (NO melena), constipation and diarrhea.  Genitourinary: Negative for dysuria, frequency, pelvic pain and vaginal discharge.  Musculoskeletal: Positive for arthralgias (Bad knees:  doesn't take anything for them.).       Dropped a pallet on back of left ankle 3 weeks ago and still not feeling right.  Skin: Negative for rash (nothing recently.).  Neurological: Negative for weakness and numbness.  Psychiatric/Behavioral: Negative for dysphoric mood and sleep disturbance. The patient is not nervous/anxious.        Objective:   Physical Exam  Constitutional: She is oriented to person, place, and time. She appears well-developed and well-nourished.  HENT:  Head: Normocephalic and atraumatic.  Right Ear:  Hearing, tympanic membrane, external ear and ear canal normal.  Left Ear: Hearing, tympanic membrane, external ear and ear canal normal.  Nose: Nose normal.  Mouth/Throat: Uvula is midline, oropharynx is clear and moist and mucous membranes are normal. Dental caries present.  Eyes: Pupils are equal, round, and reactive  to light. Conjunctivae and EOM are normal.  Discs sharp bilaterally  Neck: Normal range of motion and full passive range of motion without pain. Neck supple. No thyromegaly present.  Cardiovascular: Normal rate, regular rhythm, S1 normal and S2 normal. Exam reveals no S3 and no S4.  Murmur heard.  Systolic murmur is present with a grade of 1/6. Murmur heard only at LUSB.  Does not radiate to carotids.  No carotid bruits.  Carotid, radial, femoral, DP and PT pulses normal and equal.   Pulmonary/Chest: Effort normal and breath sounds normal. Right breast exhibits no inverted nipple, no mass, no nipple discharge, no skin change and no tenderness. Left breast exhibits no inverted nipple, no mass, no nipple discharge, no skin change and no tenderness.  Abdominal: Soft. Bowel sounds are normal. She exhibits no mass. There is no hepatosplenomegaly. There is no tenderness. No hernia.  Genitourinary:  Genitourinary Comments: Normal externa genitalia save for pigment loss of scattered areas of vulvar epidermis. No cervical lesions, no vaginal discharge.  No uterine or adnexal mass or tenderness. No rectal performed as undergoing colonoscopy tomorrow.  Musculoskeletal: Normal range of motion.  Left gastroc/soleus tendon with thickening and swelling.  Mild tenderness with palpation of tendon.  No overlying erythema or warmth.  Swelling of ankle extends down around lateral and medial malleoli of left ankle.   Able to plantar and dorsiflex at ankle.  Lymphadenopathy:       Head (right side): No submental and no submandibular adenopathy present.       Head (left side): No submental and no  submandibular adenopathy present.    She has no cervical adenopathy.    She has no axillary adenopathy.       Right: No inguinal and no supraclavicular adenopathy present.       Left: No inguinal and no supraclavicular adenopathy present.  Neurological: She is alert and oriented to person, place, and time. She has normal strength and normal reflexes. No cranial nerve deficit or sensory deficit. She exhibits normal muscle tone. Coordination and gait normal.  Skin: Skin is warm. Capillary refill takes less than 2 seconds. No rash noted.  Areas of pigment loss at anterior hairline, external ears, vulvar epidermis  Psychiatric: She has a normal mood and affect. Her speech is normal and behavior is normal. Judgment and thought content normal. Cognition and memory are normal.          Assessment & Plan:  1.  CPE with pap Slip for free flu vaccine at [facility]. To return for FLP with orange card sign up this month. Colonoscopy tomorrow with family hx of colon cancer.  2.  Dental decay:  Dental referral  3.  Left achilles tendon injury:  Referral to PT, [place] pro bono  4.  Hypertension:  Controlled.  Follow up in 6 months.

## 2018-02-12 ENCOUNTER — Ambulatory Visit (HOSPITAL_COMMUNITY): Payer: Self-pay | Admitting: Certified Registered Nurse Anesthetist

## 2018-02-12 ENCOUNTER — Other Ambulatory Visit: Payer: Self-pay

## 2018-02-12 ENCOUNTER — Encounter (HOSPITAL_COMMUNITY): Payer: Self-pay | Admitting: *Deleted

## 2018-02-12 ENCOUNTER — Encounter (HOSPITAL_COMMUNITY)
Admission: RE | Disposition: A | Discharge status: Home / Self Care | Payer: Self-pay | Source: Ambulatory Visit | Attending: Gastroenterology

## 2018-02-12 ENCOUNTER — Ambulatory Visit (HOSPITAL_COMMUNITY)
Admission: RE | Admit: 2018-02-12 | Discharge: 2018-02-12 | Disposition: A | Discharge status: Home / Self Care | Payer: No Typology Code available for payment source | Source: Ambulatory Visit | Attending: Gastroenterology | Admitting: Gastroenterology

## 2018-02-12 DIAGNOSIS — K5730 Diverticulosis of large intestine without perforation or abscess without bleeding: Secondary | ICD-10-CM | POA: Insufficient documentation

## 2018-02-12 DIAGNOSIS — I10 Essential (primary) hypertension: Secondary | ICD-10-CM | POA: Insufficient documentation

## 2018-02-12 DIAGNOSIS — Z800 Family history of malignant neoplasm of digestive organs: Secondary | ICD-10-CM | POA: Insufficient documentation

## 2018-02-12 DIAGNOSIS — Z1211 Encounter for screening for malignant neoplasm of colon: Secondary | ICD-10-CM | POA: Insufficient documentation

## 2018-02-12 DIAGNOSIS — Z8249 Family history of ischemic heart disease and other diseases of the circulatory system: Secondary | ICD-10-CM | POA: Insufficient documentation

## 2018-02-12 DIAGNOSIS — F17210 Nicotine dependence, cigarettes, uncomplicated: Secondary | ICD-10-CM | POA: Insufficient documentation

## 2018-02-12 HISTORY — PX: COLONOSCOPY WITH PROPOFOL: SHX5780

## 2018-02-12 SURGERY — COLONOSCOPY WITH PROPOFOL
Anesthesia: Monitor Anesthesia Care

## 2018-02-12 MED ORDER — SODIUM CHLORIDE 0.9 % IV SOLN: INTRAVENOUS | Status: DC

## 2018-02-12 MED ORDER — PROPOFOL 10 MG/ML IV BOLUS
INTRAVENOUS | Status: AC
  Filled 2018-02-12: qty 40

## 2018-02-12 MED ORDER — ONDANSETRON HCL 4 MG/2ML IJ SOLN
INTRAMUSCULAR | Status: DC | PRN
  Administered 2018-02-12: 4 mg via INTRAVENOUS

## 2018-02-12 MED ORDER — PROPOFOL 500 MG/50ML IV EMUL
INTRAVENOUS | Status: DC | PRN
  Administered 2018-02-12: 150 ug/kg/min via INTRAVENOUS

## 2018-02-12 MED ORDER — PROPOFOL 10 MG/ML IV BOLUS
INTRAVENOUS | Status: DC | PRN
  Administered 2018-02-12: 20 mg via INTRAVENOUS

## 2018-02-12 MED ORDER — LACTATED RINGERS IV SOLN
INTRAVENOUS | Status: DC
  Administered 2018-02-12: 13:00:00 via INTRAVENOUS

## 2018-02-12 SURGICAL SUPPLY — 22 items

## 2018-02-12 NOTE — Anesthesia Preprocedure Evaluation (Signed)
Anesthesia Evaluation  Patient identified by MRN, date of birth, ID band Patient awake    Reviewed: Allergy & Precautions, NPO status , Patient's Chart, lab work & pertinent test results  Airway Mallampati: II  TM Distance: >3 FB Neck ROM: Full    Dental  (+) Teeth Intact, Dental Advisory Given   Pulmonary Current Smoker,    Pulmonary exam normal breath sounds clear to auscultation       Cardiovascular hypertension, Pt. on medications Normal cardiovascular exam Rhythm:Regular Rate:Normal     Neuro/Psych negative neurological ROS     GI/Hepatic negative GI ROS, Neg liver ROS,   Endo/Other  negative endocrine ROS  Renal/GU negative Renal ROS     Musculoskeletal negative musculoskeletal ROS (+)   Abdominal   Peds  Hematology negative hematology ROS (+)   Anesthesia Other Findings Day of surgery medications reviewed with the patient.  Reproductive/Obstetrics                             Anesthesia Physical Anesthesia Plan  ASA: II  Anesthesia Plan: MAC   Post-op Pain Management:    Induction: Intravenous  PONV Risk Score and Plan: 1 and Propofol infusion and Treatment may vary due to age or medical condition  Airway Management Planned: Simple Face Mask and Natural Airway  Additional Equipment:   Intra-op Plan:   Post-operative Plan:   Informed Consent: I have reviewed the patients History and Physical, chart, labs and discussed the procedure including the risks, benefits and alternatives for the proposed anesthesia with the patient or authorized representative who has indicated his/her understanding and acceptance.   Dental advisory given  Plan Discussed with: CRNA and Anesthesiologist  Anesthesia Plan Comments:         Anesthesia Quick Evaluation

## 2018-02-12 NOTE — Transfer of Care (Signed)
Immediate Anesthesia Transfer of Care Note  Patient: Jacqueline Fox  Procedure(s) Performed: COLONOSCOPY WITH PROPOFOL (N/A )  Patient Location: PACU  Anesthesia Type:MAC  Level of Consciousness: awake, alert  and oriented  Airway & Oxygen Therapy: Patient Spontanous Breathing and Patient connected to face mask oxygen  Post-op Assessment: Report given to RN and Post -op Vital signs reviewed and stable  Post vital signs: Reviewed and stable  Last Vitals:  Vitals Value Taken Time  BP    Temp    Pulse    Resp    SpO2      Last Pain:  Vitals:   02/12/18 1320  TempSrc: Oral  PainSc: 0-No pain         Complications: No apparent anesthesia complications

## 2018-02-12 NOTE — Discharge Instructions (Signed)

## 2018-02-12 NOTE — H&P (Signed)
Jacqueline Fox is an 62 y.o. female.   Chief Complaint: Family hx of colon ca HPI: Pleasant 62 year old African-American female is referred by her primary physician because of a family history of colon cancer in her father, when her father was in his late 83s or perhaps early 39s.  The patient's niece has also had colon cancer.  The patient does not have worrisome lower GI symptoms such as abdominal pain or rectal bleeding or any significant change in bowel habits, and has never previously had colonoscopy  Past Medical History:  Diagnosis Date  . Family history of breast cancer   . Family history of colon cancer    Patient has had genetic testing and not found to have genetic predisposition.  Recommendations for cancer screenings as usual per Johney Maine her note 2019 please  . Family history of prostate cancer   . Hypertension     History reviewed. No pertinent surgical history.  Family History  Problem Relation Age of Onset  . Diabetes Mother   . Heart disease Mother        CHF  . Hypertension Mother   . Kidney disease Mother   . Arthritis Mother   . Dementia Mother   . Cancer Father 79       colon and prostate  . Cancer Sister 37       ?cholangiocarcinoma  . Cancer Brother 63       lung--smoker  also, prostate CA when 37-54 yo  . Cancer Brother 73       prostate  . Cancer Brother 38       Prostate--treated successfuly  . Cancer Other 11       colon--sister's daughter (sister with "gallbladder duct" cancer)  . Cancer Paternal Aunt 29       Breast Cancer  . Cancer Paternal Aunt        Bone cancer  . Stroke Paternal Grandmother   . Prostate cancer Cousin        mat first cousin   Social History:  reports that she has been smoking cigarettes. She has a 10.75 pack-year smoking history. She has never used smokeless tobacco. She reports that she drinks alcohol. She reports that she has current or past drug history. Drug: Marijuana.  Allergies: No Known  Allergies  Medications Prior to Admission  Medication Sig Dispense Refill  . amLODipine (NORVASC) 10 MG tablet Take 1 tablet (10 mg total) by mouth daily. 30 tablet 11  . Fluocinolone Acetonide Scalp (DERMA-SMOOTHE/FS SCALP) 0.01 % OIL Apply to scalp at bedtime as per instructions 118 mL 4  . triamcinolone (KENALOG) 0.025 % ointment Apply scant amount to affected areas once daily as needed. 80 g 2    No results found for this or any previous visit (from the past 48 hour(s)). No results found.  ROS see HPI  Blood pressure 125/73, pulse 76, temperature 98 F (36.7 C), temperature source Oral, resp. rate 16, height 5' 3.25" (1.607 m), weight 68.9 kg, last menstrual period 02/27/2011, SpO2 99 %. Physical Exam appears healthy and in good spirits, cognitively intact and without focal neurologic deficit.  Chest clear, heart has a 2/6 systolic murmur at the upper left sternal border.  Abdomen nontender  Assessment/Plan Clinically appropriate for screening colonoscopy in view of family history of colon cancer.  Will proceed to colonoscopy this afternoon.  Have reviewed risks and purpose with the patient and she is agreeable.  Florencia Reasons, MD 02/12/2018, 2:12 PM

## 2018-02-12 NOTE — Anesthesia Postprocedure Evaluation (Signed)
Anesthesia Post Note  Patient: Jacqueline Fox  Procedure(s) Performed: COLONOSCOPY WITH PROPOFOL (N/A )     Patient location during evaluation: Endoscopy Anesthesia Type: MAC Level of consciousness: awake and alert, oriented and patient cooperative Pain management: pain level controlled Vital Signs Assessment: post-procedure vital signs reviewed and stable Respiratory status: spontaneous breathing, nonlabored ventilation and respiratory function stable Cardiovascular status: blood pressure returned to baseline and stable Postop Assessment: no apparent nausea or vomiting Anesthetic complications: no    Last Vitals:  Vitals:   02/12/18 1449 02/12/18 1505  BP: 91/62 131/74  Pulse: 68 69  Resp: (!) 21 19  Temp:    SpO2: 100% 100%    Last Pain:  Vitals:   02/12/18 1505  TempSrc:   PainSc: 0-No pain                 Elyssa Pendelton,E. Jannelly Bergren

## 2018-02-12 NOTE — Op Note (Signed)
[facility] Patient Name: Jacqueline Fox Procedure Date: 02/12/2018 MRN: [id number] Attending MD: Bernette Redbird , MD Date of Birth: 09-11-1955 CSN: [id number] Age: 62 Admit Type: Outpatient Procedure:                Colonoscopy Indications:              Screening in patient at increased risk: Colorectal                            cancer in father 63 or older (approximately age                            37), This is the patient's first colonoscopy Providers:                Bernette Redbird, MD, Janae Sauce. Steele Berg, RN, Kandice Robinsons, Technician Referring MD:              Medicines:                Monitored Anesthesia Care Complications:            No immediate complications. Estimated Blood Loss:     Estimated blood loss: none. Procedure:                After obtaining informed consent, the colonoscope                            was passed under direct vision. Throughout the                            procedure, the patient's blood pressure, pulse, and                            oxygen saturations were monitored continuously. The                            CF-HQ190L ([id number]) Olympus adult colonoscope was                            introduced through the anus and advanced to the the                            terminal ileum. The colonoscopy was performed                            without difficulty. The patient tolerated the                            procedure well. The quality of the bowel                            preparation was good--there was some film coating  the proximal colon, but it could readily be washed                            off. Scope In: 2:21:10 PM Scope Out: 2:42:28 PM Scope Withdrawal Time: 0 hours 13 minutes 55 seconds  Total Procedure Duration: 0 hours 21 minutes 18 seconds  Findings:      The perianal and digital rectal examinations were normal. There was an 5       mm diameter  excoriation on the left gluteal cheek.      A single medium-mouthed diverticulum was found in the proximal ascending       colon.      No other significant abnormalities were identified in a careful       examination of the remainder of the colon.      The terminal ileum appeared normal.      The retroflexed view of the distal rectum and anal verge was normal and       showed no anal or rectal abnormalities. Impression:               - Diverticulosis (solitary diverticulum) in the                            proximal ascending colon.                           - The examined portion of the ileum was normal.                           - The distal rectum and anal verge are normal on                            retroflexion view.                           - No specimens collected.                           - No polyps/cancer/colitis/vascular ectasiae seen. Moderate Sedation:      This patient was sedated with monitored anesthesia care, not moderate       sedation. Recommendation:           - Repeat colonoscopy in 10 years for screening                            purposes. Procedure Code(s):        --- Professional ---                           [phone], Colonoscopy, flexible; diagnostic, including                            collection of specimen(s) by brushing or washing,                            when performed (separate procedure) Diagnosis Code(s):        --- Professional ---  Z80.0, Family history of malignant neoplasm of                            digestive organs CPT copyright 2017 American Medical Association. All rights reserved. The codes documented in this report are preliminary and upon coder review may  be revised to meet current compliance requirements. Bernette Redbird, MD 02/12/2018 2:53:47 PM This report has been signed electronically. Number of Addenda: 0

## 2018-02-13 ENCOUNTER — Encounter (HOSPITAL_COMMUNITY): Payer: Self-pay | Admitting: Gastroenterology

## 2018-02-13 LAB — CYTOLOGY - PAP

## 2018-02-26 ENCOUNTER — Other Ambulatory Visit: Payer: Self-pay

## 2018-02-26 DIAGNOSIS — Z13220 Encounter for screening for lipoid disorders: Secondary | ICD-10-CM

## 2018-02-26 NOTE — Progress Notes (Signed)
Patient is hard stick and unable to obtain blood draw. Patient will come back tomorrow to have done again.

## 2018-02-27 ENCOUNTER — Other Ambulatory Visit: Payer: Self-pay

## 2018-02-27 DIAGNOSIS — Z13220 Encounter for screening for lipoid disorders: Secondary | ICD-10-CM

## 2018-02-28 LAB — LIPID PANEL W/O CHOL/HDL RATIO
Cholesterol, Total: 229 mg/dL — ABNORMAL HIGH (ref 100–199)
HDL: 79 mg/dL (ref >39)
LDL CALC: 135 mg/dL — AB (ref 0–99)
TRIGLYCERIDES: 76 mg/dL (ref 0–149)
VLDL Cholesterol Cal: 15 mg/dL (ref 5–40)

## 2018-08-17 ENCOUNTER — Ambulatory Visit: Payer: Self-pay | Admitting: Internal Medicine

## 2018-09-21 ENCOUNTER — Telehealth: Payer: Self-pay | Admitting: Internal Medicine

## 2018-09-21 MED ORDER — AMLODIPINE BESYLATE 10 MG PO TABS: 10.0000 mg | ORAL_TABLET | Freq: Every day | ORAL | 11 refills | Status: DC

## 2018-09-21 NOTE — Telephone Encounter (Signed)
Patient called requesting Rx on amLODipine (NORVASC) 10 MG tablet to be called in at [facility] [address].  Please advise.

## 2018-09-21 NOTE — Telephone Encounter (Signed)
Rx sent to [facility]

## 2018-09-28 ENCOUNTER — Ambulatory Visit: Payer: Self-pay | Admitting: Internal Medicine

## 2018-11-09 ENCOUNTER — Other Ambulatory Visit: Payer: Self-pay

## 2018-11-09 ENCOUNTER — Encounter: Payer: Self-pay | Admitting: Internal Medicine

## 2018-11-09 ENCOUNTER — Ambulatory Visit (INDEPENDENT_AMBULATORY_CARE_PROVIDER_SITE_OTHER): Payer: Self-pay | Admitting: Internal Medicine

## 2018-11-09 VITALS — BP 124/80 | HR 78 | Resp 12 | Ht 63.25 in | Wt 154.0 lb

## 2018-11-09 DIAGNOSIS — L210 Seborrhea capitis: Secondary | ICD-10-CM

## 2018-11-09 DIAGNOSIS — Z716 Tobacco abuse counseling: Secondary | ICD-10-CM

## 2018-11-09 DIAGNOSIS — I10 Essential (primary) hypertension: Secondary | ICD-10-CM

## 2018-11-09 NOTE — Patient Instructions (Signed)
For Nicotine gum:  When you feel need for smoking:  Place nicotine gum in mouth and chew until juicy, then stick between gum and cheek.  You will absorb the nicotine from your mouth.   Do not aggressively chew gum. Spit gum out in garbage once you feel you are done with it.  Tobacco Cessation:   1800QUITNOW or [phone], the former for support and possibly free nicotine patches/gum and support; the latter for [facility] Smoking cessation class. Get rid of all smoking supplies:  Cigarettes, lighters, ashtrays--no stashes just in case at home if you are serious. 

## 2018-11-09 NOTE — Progress Notes (Signed)
    Subjective:    Patient ID: Jacqueline Fox, female   DOB: Aug 27, 1955, 63 y.o.   MRN: [id number]   HPI   1.  Hypertension:  No problems with meds.   2.  Skin lesions responding to Derma Smooth.  She has not tried this on lesions she thinks she gets from her necklace.  Not clear if this is gold plating on nickel or other common skin irritant.  Also, has thickening and fissuring on nose.  States she scrubbed the area about a year ago and got it all irritated.  Is better now.  Has scalp lesions as well.   She does have plastic glasses that she got sometime before the nasal skin changes started. At end of visit, she shows my other lesions hidden under her mask around nasolabial folds.  States they come and go and have been there for 4 months.  She has not attempted to treat with the Derma smoothe. Denies joint complaints, dyspnea or cough.  3.  Tobacco abuse:  No change.  Smokes 3-4 cigarettes daily and smokes MJ on weekends.  Spends most of the time staying with her mother during day and is bored.  Lives with a friend who does not smoke.  Mother does not smoke.  Current Meds  Medication Sig  . amLODipine (NORVASC) 10 MG tablet Take 1 tablet (10 mg total) by mouth daily.  . Fluocinolone Acetonide Scalp (DERMA-SMOOTHE/FS SCALP) 0.01 % OIL Apply to scalp at bedtime as per instructions  . Multiple Vitamin (MULTIVITAMIN) tablet Take 1 tablet by mouth daily.  Marland Kitchen triamcinolone (KENALOG) 0.025 % ointment Apply scant amount to affected areas once daily as needed.   No Known Allergies   Review of Systems    Objective:   BP 124/80 (BP Location: Left Arm, Patient Position: Sitting, Cuff Size: Normal)   Pulse 78   Resp 12   Ht 5' 3.25" (1.607 m)   Wt 154 lb (69.9 kg)   LMP 02/27/2011   BMI 27.06 kg/m   Physical Exam  NAD HEENT:  PERRL, EOM Neck:  Supple, No adenopathy, no thyromegaly.   Chest:  CTA CV:  RRR without murmur or rub, radial pulses normal and equal. LE:  No edema Skin:   Small patches of hypopigmentation at hairline.  Nasal bridge with thickened hardened scaled area that is well demarcated and symmetric.  Hardened vesicular looking lesions (this just before leaving) in nasolabial fold areas bilaterally. Chest area where necklace falls with multiple areas of different stages of outbreak and healing.  Mildly erythematous with darkening and hyperpigmentation as resolves  Assessment & Plan   1.  Hypertension:  Controlled.  2.  Skin lesions:  Not sure if these are all the same.  Felt to have erythema nodosum previously and resolved.  She feels these lesions are different. To get her plastic glasses off her nose and quit picking at the lesions Ok to use Derma Smoothe on the lesions as well twice daily. Consider derm referral if do not improve in 2 weeks with corticosteroids  3.  Tobacco abuse:  To call the quit line and see about free nicotine gum or lozenges. Discussed getting rid of smoking materials as she starts. Once again emphasized moving away from smoking other plant life, perhaps finding alternate versions that do not require smoking. Also emphasized concern for her family history of cancer and continued use of tobacco.  Follow up in October for CPE without pap

## 2019-02-10 ENCOUNTER — Encounter: Payer: Self-pay | Admitting: Internal Medicine

## 2019-02-10 ENCOUNTER — Other Ambulatory Visit: Payer: Self-pay

## 2019-02-10 ENCOUNTER — Ambulatory Visit: Payer: Self-pay | Admitting: Internal Medicine

## 2019-02-10 VITALS — BP 136/80 | HR 76 | Resp 12 | Ht 63.25 in | Wt 151.0 lb

## 2019-02-10 DIAGNOSIS — Z0000 Encounter for general adult medical examination without abnormal findings: Secondary | ICD-10-CM

## 2019-02-10 DIAGNOSIS — Z716 Tobacco abuse counseling: Secondary | ICD-10-CM

## 2019-02-10 DIAGNOSIS — I10 Essential (primary) hypertension: Secondary | ICD-10-CM

## 2019-02-10 DIAGNOSIS — L989 Disorder of the skin and subcutaneous tissue, unspecified: Secondary | ICD-10-CM

## 2019-02-10 MED ORDER — FLUOCINOLONE ACETONIDE SCALP 0.01 % EX OIL: TOPICAL_OIL | CUTANEOUS | 4 refills | Status: DC

## 2019-02-10 MED ORDER — TRIAMCINOLONE ACETONIDE 0.025 % EX OINT: TOPICAL_OINTMENT | CUTANEOUS | 2 refills | Status: DC

## 2019-02-10 NOTE — Progress Notes (Signed)
Subjective:    Patient ID: Jacqueline Fox, female   DOB: May 26, 1955, 63 y.o.   MRN: [id number]   HPI   CPE without pap  1.  Pap:  Last performed 02/11/2018 and normal  2.  Mammogram:  Last performed 09/17/2017.  Has completed paperwork for this year, but has not called to make appt. Paternal aunt with history of breast cancer in her 59s.  Still living.  3.  Osteoprevention:  Not enough dairy to get enough Calcium and Vitamin D, but would be willing to work on 3 servings of almond milk, yogurt and cheese daily.  Has been working on cleaning her home.  Can walk around the church near her mom's.  4.  Guaiac Cards: Negative last year.  5.  Colonoscopy:  02/2018 with Dr. Cristina Gong.  No polyps.  Next in 2029.  Niece with history of colon cancer age 23.  Sister's daughter.  6.  Immunizations:   Immunization History  Administered Date(s) Administered  . Influenza,inj,Quad PF,6+ Mos 02/18/2018  . Tdap 08/20/2017     7.  Glucose/Cholesterol:  Fine in past.  Lipids high, but good HDL.   Lipid Panel     Component Value Date/Time   CHOL 229 (H) 02/27/2018 0919   TRIG 76 02/27/2018 0919   HDL 79 02/27/2018 0919   LDLCALC 135 (H) 02/27/2018 0919   LABVLDL 15 02/27/2018 0919     Current Meds  Medication Sig  . amLODipine (NORVASC) 10 MG tablet Take 1 tablet (10 mg total) by mouth daily.  . Multiple Vitamin (MULTIVITAMIN) tablet Take 1 tablet by mouth daily.  Marland Kitchen triamcinolone (KENALOG) 0.025 % ointment Apply scant amount to affected areas once daily as needed.   No Known Allergies  Past Medical History:  Diagnosis Date  . Family history of breast cancer   . Family history of colon cancer    Patient has had genetic testing and not found to have genetic predisposition.  Recommendations for cancer screenings as usual per Tamsen Meek her note 2019 please  . Family history of prostate cancer   . Hypertension    Past Surgical History:  Procedure Laterality Date  . COLONOSCOPY  WITH PROPOFOL N/A 02/12/2018   Procedure: COLONOSCOPY WITH PROPOFOL;  Surgeon: Ronald Lobo, MD;  Location: [facility];  Service: Endoscopy;  Laterality: N/A;      Family History  Problem Relation Age of Onset  . Diabetes Mother   . Heart disease Mother        CHF  . Hypertension Mother   . Kidney disease Mother   . Arthritis Mother   . Dementia Mother   . Cancer Father 26       colon and prostate  . Cancer Sister 41       ?cholangiocarcinoma  . Cancer Brother 15       lung--smoker  also, prostate CA when 50-52 yo  . Cancer Brother 86       prostate  . Cancer Brother 27       Prostate--treated successfuly  . Cancer Other 58       colon--sister's daughter (sister with "gallbladder duct" cancer)  . Cancer Paternal Aunt 42       Breast Cancer  . Cancer Paternal Aunt        Bone cancer  . Stroke Paternal Grandmother   . Prostate cancer Cousin        mat first cousin   Social History   Socioeconomic History  .  Marital status: Soil scientist    Spouse name: Serita Kyle  . Number of children: 4  . Years of education: 79  . Highest education level: Associate degree: academic program  Occupational History  . 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 Financial resource strain: Not very hard  . Food insecurity    Worry: Never true    Inability: Never true  . Transportation needs    Medical: No    Non-medical: No  Tobacco Use  . Smoking status: Current Every Day Smoker    Packs/day: 0.25    Years: 43.00    Pack years: 10.75    Types: Cigarettes  . Smokeless tobacco: Never Used  . Tobacco comment: Not clear if she is ready to quit, despite family history of cancers.  Substance and Sexual Activity  . Alcohol use: Yes    Comment: beer and wine- 4 beers per week , 3 glasses of wine weekly   . Drug use: Yes    Types: Marijuana    Comment: edibles  . Sexual activity: Not Currently  Lifestyle  . Physical activity     Days per week: 0 days    Minutes per session: 0 min  . Stress: Only a little  Relationships  . Social connections    Talks on phone: More than three times a week    Gets together: More than three times a week    Attends religious service: More than 4 times per year    Active member of club or organization: Yes    Attends meetings of clubs or organizations: More than 4 times per year    Relationship status: Living with partner  . Intimate partner violence    Fear of current or ex partner: No    Emotionally abused: No    Physically abused: No    Forced sexual activity: No  Other Topics Concern  . Not on file  Social History Narrative   Has been together with female partner for 28 years (2020).   He does not smoke.     Currently just lives with her partner, Alfonse Spruce     Review of Systems  Constitutional: Negative for appetite change, fatigue and fever.  HENT: Positive for dental problem (Has been to dental clinic, but has not been reappointed since pandemic.). Negative for ear pain, hearing loss, rhinorrhea and sore throat.   Eyes: Negative for visual disturbance (wears glasses--no problems with current lenses).  Respiratory: Negative for cough and shortness of breath.   Cardiovascular: Negative for chest pain, palpitations and leg swelling.  Gastrointestinal: Negative for abdominal pain, blood in stool (No melena), constipation and diarrhea.  Genitourinary: Negative for dysuria, enuresis, vaginal bleeding and vaginal discharge.  Musculoskeletal: Negative for arthralgias and joint swelling.  Skin:       Chronic skin lesions plus newer ones on arms and neck now for 2 days.  Did itch.  When has night sweats, they appear and itch.   Has facial lesions still since last visit that are not really improving. Cortisone oils on scalp help the scalp lesions.  Neurological: Negative for weakness and numbness.  Psychiatric/Behavioral: Negative for dysphoric mood. The patient is not  nervous/anxious.       Objective:   BP 136/80 (BP Location: Left Arm, Patient Position: Sitting, Cuff Size: Normal)   Pulse 76   Resp 12   Ht 5' 3.25" (1.607 m)   Wt 151 lb (  68.5 kg)   LMP 02/27/2011   BMI 26.54 kg/m   Physical Exam  Constitutional: She is oriented to person, place, and time. She appears well-developed and well-nourished.  HENT:  Head: Normocephalic and atraumatic.  Right Ear: Hearing, tympanic membrane, external ear and ear canal normal.  Left Ear: Hearing, tympanic membrane, external ear and ear canal normal.  Nose: Nose normal.  Mouth/Throat: Uvula is midline, oropharynx is clear and moist and mucous membranes are normal.  Eyes: Pupils are equal, round, and reactive to light. Conjunctivae and EOM are normal.  Discs sharp bilaterally  Neck: Normal range of motion and full passive range of motion without pain. Neck supple. No thyroid mass and no thyromegaly present.  Cardiovascular: Normal rate, regular rhythm, S1 normal and S2 normal. Exam reveals no S3, no S4 and no friction rub.  No murmur heard. No carotid bruits.  Carotid, radial, femoral, DP and PT pulses normal and equal.   Pulmonary/Chest: Effort normal and breath sounds normal. Right breast exhibits no inverted nipple, no mass, no nipple discharge, no skin change and no tenderness. Left breast exhibits no inverted nipple, no mass, no nipple discharge, no skin change and no tenderness.  Abdominal: Soft. Bowel sounds are normal. She exhibits no mass. There is no hepatosplenomegaly. There is no abdominal tenderness. No hernia.  Genitourinary:    Genitourinary Comments: Normal external female genitalia. No uterine or adnexal mass or tenderness. Rectal:  No mass, Heme negative light brown stool.     Musculoskeletal: Normal range of motion.  Lymphadenopathy:       Head (right side): No submental and no submandibular adenopathy present.       Head (left side): No submental and no submandibular adenopathy  present.    She has no cervical adenopathy.    She has no axillary adenopathy.       Right: No inguinal and no supraclavicular adenopathy present.       Left: No inguinal and no supraclavicular adenopathy present.  Neurological: She is alert and oriented to person, place, and time. She has normal strength and normal reflexes. No cranial nerve deficit or sensory deficit. Coordination and gait normal.  Skin: Skin is warm.  Mid forehead at hairline, ears, face--especially nose and scattered on cheeks with hypertrophic change and pigment loss. Similar on neckline with hyperpigmented and hypopigmented changes from previous lesions and hypertrophic changes.  Newer lesions that are 1 cm circular raised flat circular lesions on extensor surfaces of forearms, neckline, and lateral left thigh.  Psychiatric: She has a normal mood and affect. Her speech is normal and behavior is normal. Judgment and thought content normal. Cognition and memory are normal.      Assessment & Plan   1.  CPE without pap Flu shot Rx with [facility] given. Guaiac cards x 3 to return in 2 weeks. Fasting labs in 1 week:  FLP, CBC, CMP   2. 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  Never obtained CXR--reorder. Has seen Dermatology before, but am concerned she needs biopsy at this point.  3.  Hypertension:  Controlled.

## 2019-02-10 NOTE — Patient Instructions (Signed)
Can google "advance directives, [place]"  And bring up form from Secretary of State. Print and fill out Or can go to "5 wishes"  Which is also in Spanish and fill out--this costs $5--perhaps easier to use. Designate a Medical Power of Attorney to speak for you if you are unable to speak for yourself when ill or injured  

## 2019-02-18 ENCOUNTER — Other Ambulatory Visit: Payer: Self-pay

## 2019-02-19 ENCOUNTER — Other Ambulatory Visit: Payer: Self-pay | Admitting: Internal Medicine

## 2019-02-19 DIAGNOSIS — Z1231 Encounter for screening mammogram for malignant neoplasm of breast: Secondary | ICD-10-CM

## 2019-02-21 ENCOUNTER — Encounter: Payer: Self-pay | Admitting: Internal Medicine

## 2019-02-21 NOTE — Assessment & Plan Note (Signed)
Genetic testing done with extensive family history of different cancers

## 2019-02-22 ENCOUNTER — Other Ambulatory Visit: Payer: Self-pay

## 2019-02-22 DIAGNOSIS — Z0000 Encounter for general adult medical examination without abnormal findings: Secondary | ICD-10-CM

## 2019-02-22 DIAGNOSIS — Z1211 Encounter for screening for malignant neoplasm of colon: Secondary | ICD-10-CM

## 2019-02-22 LAB — POC HEMOCCULT BLD/STL (HOME/3-CARD/SCREEN)
Card #2 Fecal Occult Blod, POC: NEGATIVE
Card #3 Fecal Occult Blood, POC: NEGATIVE
Fecal Occult Blood, POC: NEGATIVE

## 2019-02-23 LAB — COMPREHENSIVE METABOLIC PANEL
ALT: 17 IU/L (ref 0–32)
AST: 19 IU/L (ref 0–40)
Albumin/Globulin Ratio: 1.3 (ref 1.2–2.2)
Albumin: 4.4 g/dL (ref 3.8–4.9)
Alkaline Phosphatase: 61 IU/L (ref 39–117)
BUN/Creatinine Ratio: 14 (ref 12–28)
BUN: 7 mg/dL — ABNORMAL LOW (ref 8–27)
Bilirubin Total: 0.2 mg/dL (ref 0.0–1.2)
CO2: 24 mmol/L (ref 20–29)
Calcium: 9.6 mg/dL (ref 8.7–10.3)
Chloride: 105 mmol/L (ref 96–106)
Creatinine, Ser: 0.5 mg/dL — ABNORMAL LOW (ref 0.57–1.00)
GFR calc Af Amer: 122 mL/min/{1.73_m2} (ref >59)
GFR calc non Af Amer: 106 mL/min/{1.73_m2} (ref >59)
Globulin, Total: 3.5 g/dL (ref 1.5–4.5)
Glucose: 93 mg/dL (ref 65–99)
Potassium: 4.3 mmol/L (ref 3.5–5.2)
Sodium: 143 mmol/L (ref 134–144)
Total Protein: 7.9 g/dL (ref 6.0–8.5)

## 2019-02-23 LAB — CBC WITH DIFFERENTIAL/PLATELET
Basophils Absolute: 0 10*3/uL (ref 0.0–0.2)
Basos: 1 %
EOS (ABSOLUTE): 0 10*3/uL (ref 0.0–0.4)
Eos: 1 %
Hematocrit: 37.9 % (ref 34.0–46.6)
Hemoglobin: 12.7 g/dL (ref 11.1–15.9)
Immature Grans (Abs): 0 10*3/uL (ref 0.0–0.1)
Immature Granulocytes: 0 %
Lymphocytes Absolute: 1.7 10*3/uL (ref 0.7–3.1)
Lymphs: 34 %
MCH: 31.8 pg (ref 26.6–33.0)
MCHC: 33.5 g/dL (ref 31.5–35.7)
MCV: 95 fL (ref 79–97)
Monocytes Absolute: 0.4 10*3/uL (ref 0.1–0.9)
Monocytes: 9 %
Neutrophils Absolute: 2.8 10*3/uL (ref 1.4–7.0)
Neutrophils: 55 %
Platelets: 290 10*3/uL (ref 150–450)
RBC: 3.99 x10E6/uL (ref 3.77–5.28)
RDW: 13.5 % (ref 11.7–15.4)
WBC: 5 10*3/uL (ref 3.4–10.8)

## 2019-02-23 LAB — LIPID PANEL W/O CHOL/HDL RATIO
Cholesterol, Total: 208 mg/dL — ABNORMAL HIGH (ref 100–199)
HDL: 68 mg/dL
LDL Chol Calc (NIH): 114 mg/dL — ABNORMAL HIGH (ref 0–99)
Triglycerides: 152 mg/dL — ABNORMAL HIGH (ref 0–149)
VLDL Cholesterol Cal: 26 mg/dL (ref 5–40)

## 2019-03-30 ENCOUNTER — Other Ambulatory Visit: Payer: Self-pay

## 2019-03-30 DIAGNOSIS — R300 Dysuria: Secondary | ICD-10-CM

## 2019-03-30 LAB — POCT URINALYSIS DIPSTICK
Bilirubin, UA: NEGATIVE
Glucose, UA: NEGATIVE
Nitrite, UA: POSITIVE
Protein, UA: POSITIVE — AB
Spec Grav, UA: 1.01 (ref 1.010–1.025)
Urobilinogen, UA: 1 E.U./dL
pH, UA: 7.5 (ref 5.0–8.0)

## 2019-03-30 MED ORDER — PHENAZOPYRIDINE HCL 100 MG PO TABS: 100.0000 mg | ORAL_TABLET | Freq: Three times a day (TID) | ORAL | 0 refills | Status: DC | PRN

## 2019-03-30 MED ORDER — CIPROFLOXACIN HCL 500 MG PO TABS: 500.0000 mg | ORAL_TABLET | Freq: Two times a day (BID) | ORAL | 0 refills | Status: DC

## 2019-03-30 NOTE — Progress Notes (Signed)
Patient with frequency, pressure and burning with urination x 1-2 days

## 2019-04-02 LAB — URINE CULTURE

## 2019-04-07 ENCOUNTER — Other Ambulatory Visit: Payer: Self-pay

## 2019-04-07 ENCOUNTER — Ambulatory Visit
Admission: RE | Admit: 2019-04-07 | Discharge: 2019-04-07 | Disposition: A | Discharge status: Home / Self Care | Payer: No Typology Code available for payment source | Source: Ambulatory Visit | Attending: Internal Medicine | Admitting: Internal Medicine

## 2019-04-07 DIAGNOSIS — Z1231 Encounter for screening mammogram for malignant neoplasm of breast: Secondary | ICD-10-CM

## 2019-04-07 IMAGING — MG DIGITAL SCREENING BILAT W/ TOMO W/ CAD
8 series · 9 of 24 positions shown · non-contrast
Comparison: Previous exam(s).

CLINICAL DATA: Screening.

EXAM:
DIGITAL SCREENING BILATERAL MAMMOGRAM WITH TOMO AND CAD

[R MLO synth-2D]
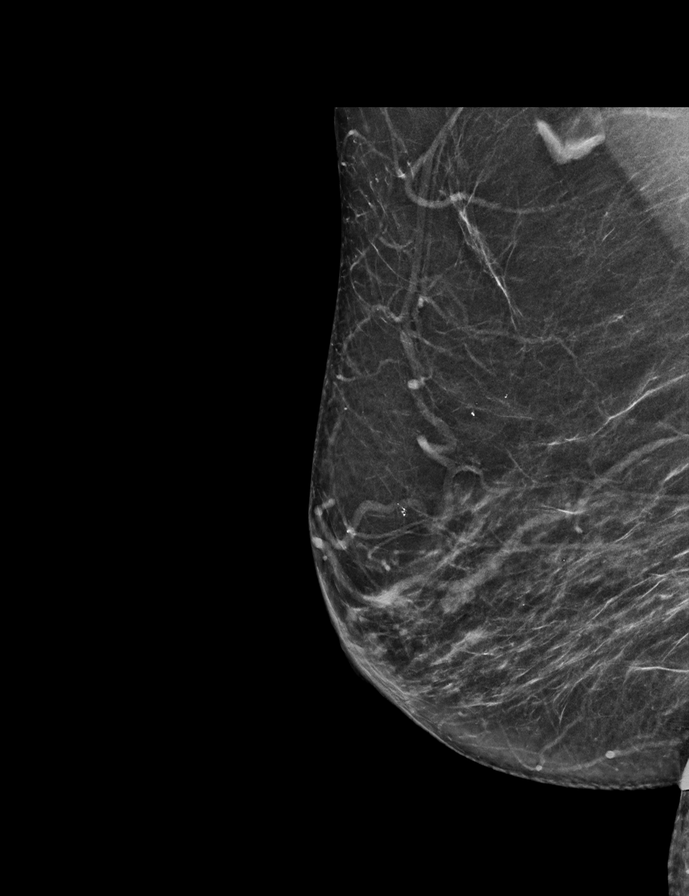

[L MLO synth-2D]
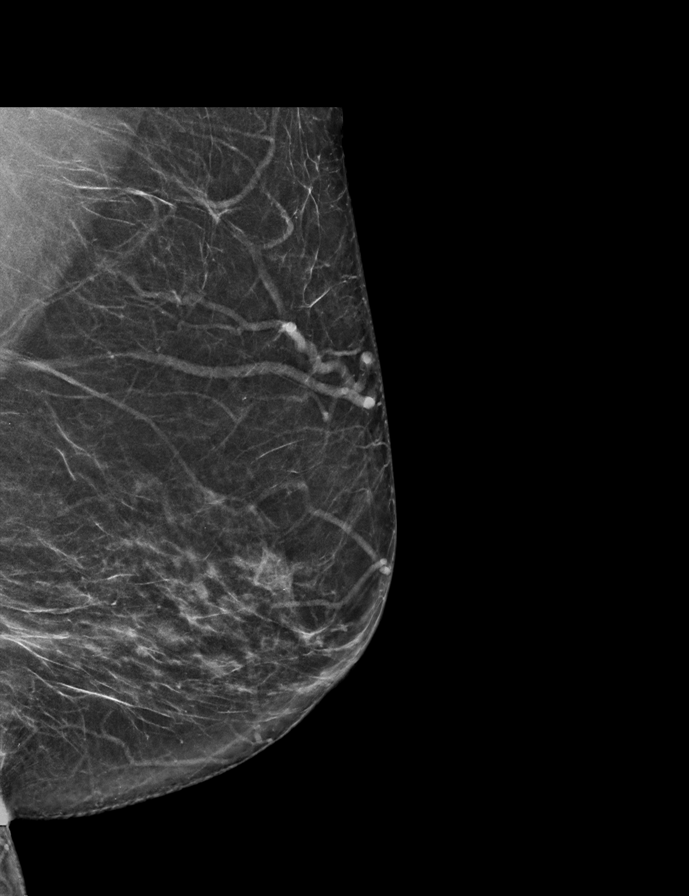

[R CC synth-2D]
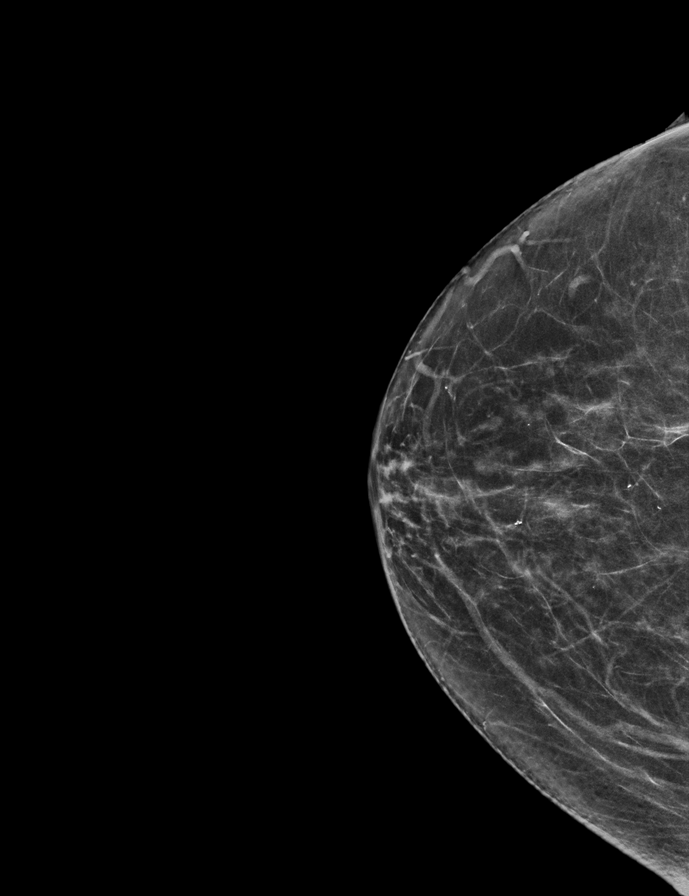

[L CC synth-2D]
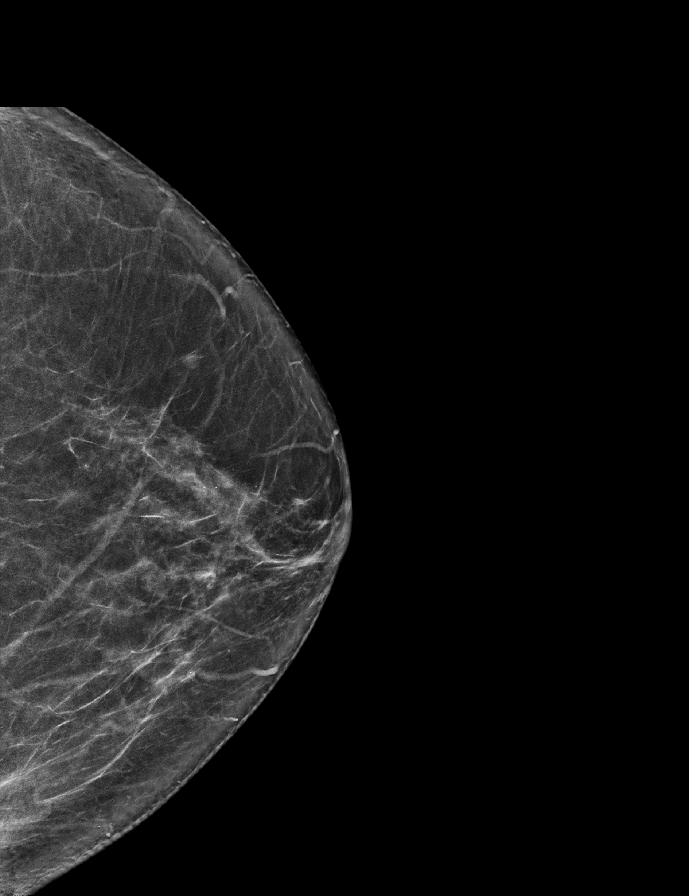

[R MLO tomo · 2 of 63 frames shown]
[frame 21/63]
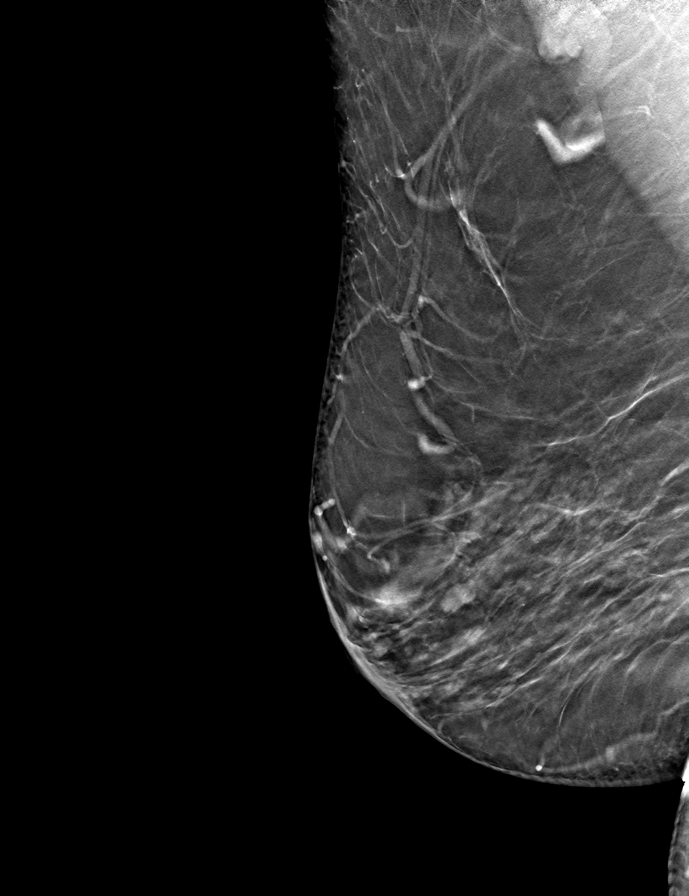
[frame 32/63]
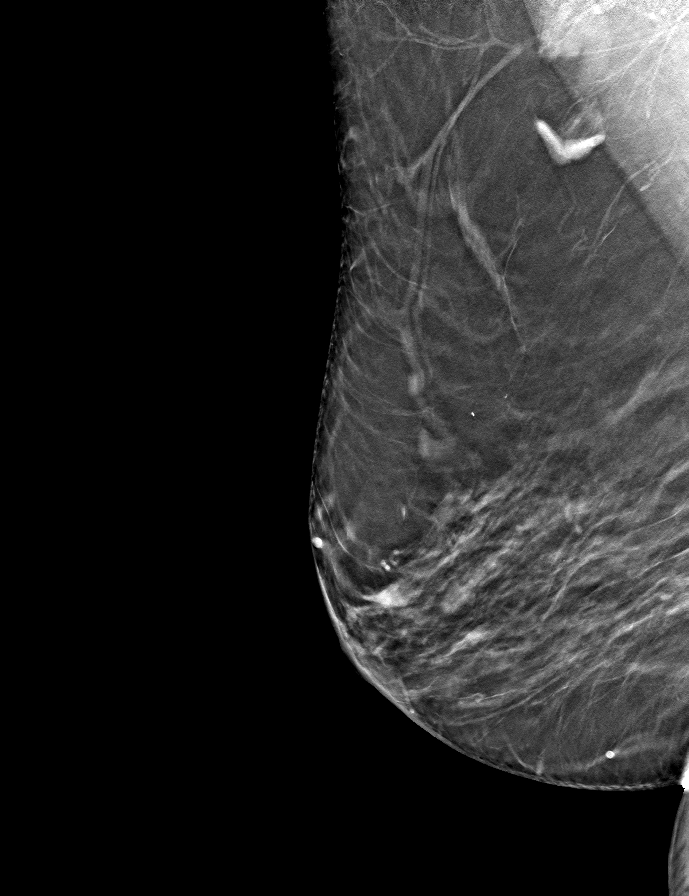

[L MLO tomo · tomo slice 33/66.0]
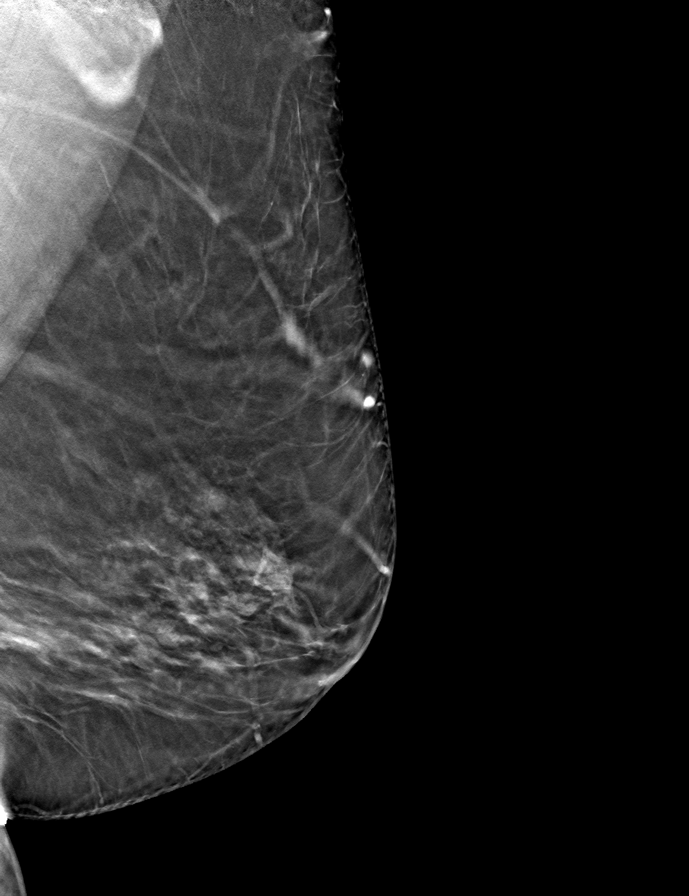

[R CC tomo · tomo slice 29/58.0]
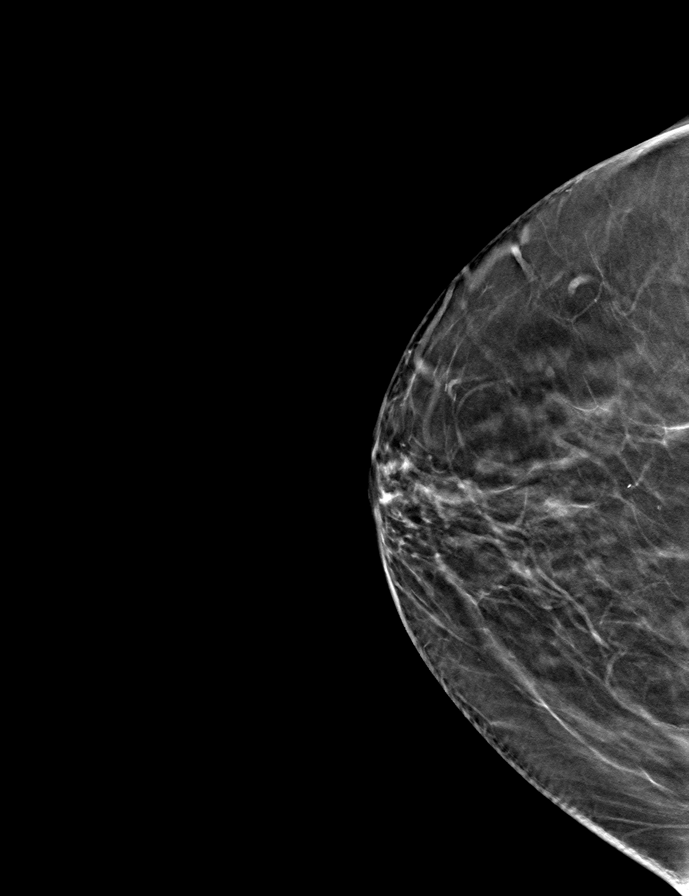

[L CC tomo · tomo slice 32/63.0]
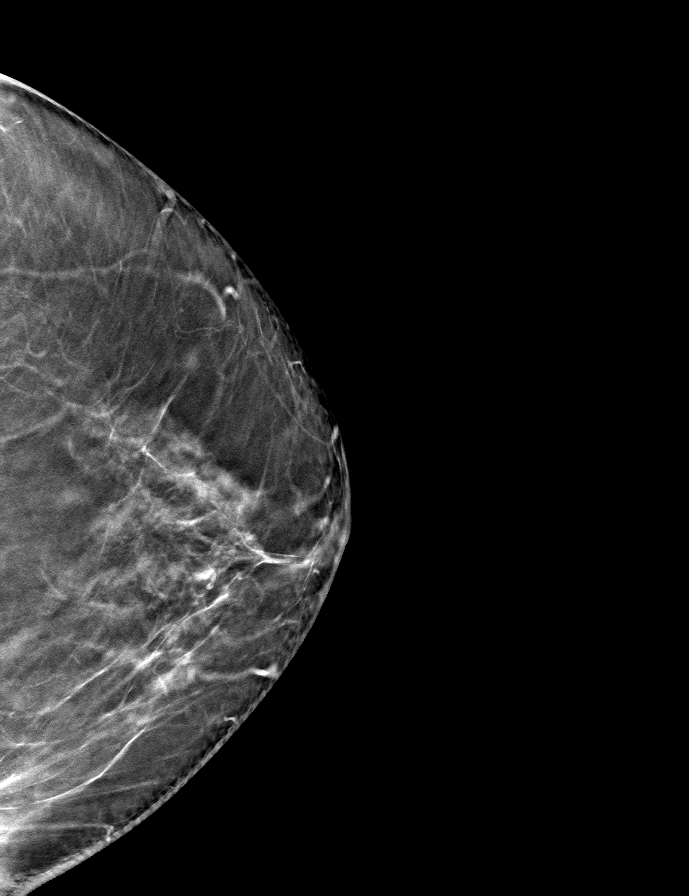

[9 of 24 positions shown; findings below may reference images not displayed]

ACR Breast Density Category b: There are scattered areas of
fibroglandular density.
FINDINGS: In the right breast, a possible mass warrants further evaluation. In
the left breast, no findings suspicious for malignancy. Images were
processed with CAD.
IMPRESSION: Further evaluation is suggested for possible mass in the right
breast.

RECOMMENDATION:
Diagnostic mammogram and possibly ultrasound of the right breast.
(Code:[MT])

The patient will be contacted regarding the findings, and additional
imaging will be scheduled.

BI-RADS CATEGORY  0: Incomplete. Need additional imaging evaluation
and/or prior mammograms for comparison.

## 2019-04-12 ENCOUNTER — Other Ambulatory Visit (HOSPITAL_COMMUNITY): Payer: Self-pay | Admitting: *Deleted

## 2019-04-12 ENCOUNTER — Other Ambulatory Visit: Payer: Self-pay | Admitting: Internal Medicine

## 2019-04-12 DIAGNOSIS — R928 Other abnormal and inconclusive findings on diagnostic imaging of breast: Secondary | ICD-10-CM

## 2019-04-12 DIAGNOSIS — N6310 Unspecified lump in the right breast, unspecified quadrant: Secondary | ICD-10-CM

## 2019-04-26 ENCOUNTER — Telehealth: Payer: Self-pay | Admitting: Internal Medicine

## 2019-04-26 NOTE — Telephone Encounter (Signed)
Please have patient come in for ELISA testing for Lyme Disease and for an RPR:  Diagnosis is for skin lesions.  Before ordering, need cost of Lyme Testing  May also order serum Angiotensin Converting enzyme as well, but need cost of that first.  Also, check to see why she never went for chest xray--that is also important to rule out sarcoidosis.  Will order again if she will go.

## 2019-04-27 NOTE — Telephone Encounter (Signed)
Spoke with patient lab appointment scheduled for 05/12/2019 @ 12:00 pm. Pt. Will like to get informed about lab fee before appointment. Patient informed the nurse is out this week and will check on lab cost next week when comes to the clinic.

## 2019-04-29 ENCOUNTER — Encounter (HOSPITAL_COMMUNITY): Payer: Self-pay

## 2019-04-29 ENCOUNTER — Ambulatory Visit
Admission: RE | Admit: 2019-04-29 | Discharge: 2019-04-29 | Disposition: A | Discharge status: Home / Self Care | Payer: No Typology Code available for payment source | Source: Ambulatory Visit | Attending: Obstetrics and Gynecology | Admitting: Obstetrics and Gynecology

## 2019-04-29 ENCOUNTER — Other Ambulatory Visit: Payer: Self-pay

## 2019-04-29 ENCOUNTER — Other Ambulatory Visit (HOSPITAL_COMMUNITY): Payer: Self-pay | Admitting: Obstetrics and Gynecology

## 2019-04-29 ENCOUNTER — Ambulatory Visit (HOSPITAL_COMMUNITY)
Admission: RE | Admit: 2019-04-29 | Discharge: 2019-04-29 | Disposition: A | Discharge status: Home / Self Care | Payer: No Typology Code available for payment source | Source: Ambulatory Visit | Attending: Obstetrics and Gynecology | Admitting: Obstetrics and Gynecology

## 2019-04-29 DIAGNOSIS — N6310 Unspecified lump in the right breast, unspecified quadrant: Secondary | ICD-10-CM

## 2019-04-29 DIAGNOSIS — Z1239 Encounter for other screening for malignant neoplasm of breast: Secondary | ICD-10-CM

## 2019-04-29 IMAGING — MG MM DIGITAL DIAGNOSTIC UNILAT*R* W/ TOMO W/ CAD
4 series · 4 of 12 positions shown · non-contrast
Comparison: Previous exam(s).

CLINICAL DATA: Screening recall for a possible right breast mass.

EXAM:
DIGITAL DIAGNOSTIC RIGHT MAMMOGRAM WITH CAD AND TOMO
ULTRASOUND RIGHT BREAST

[R CC synth-2D]
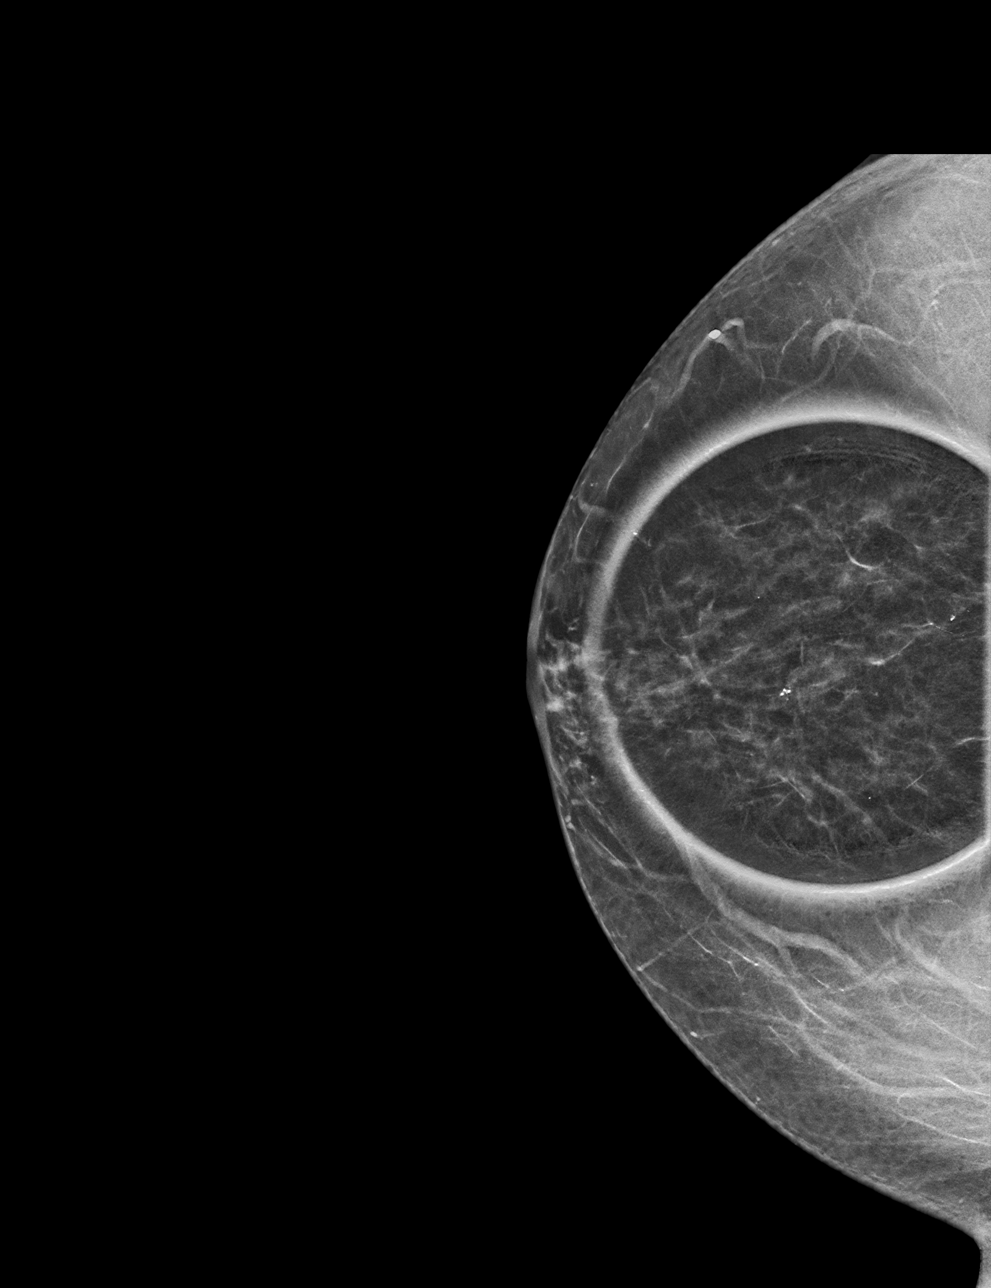

[R MLO synth-2D]
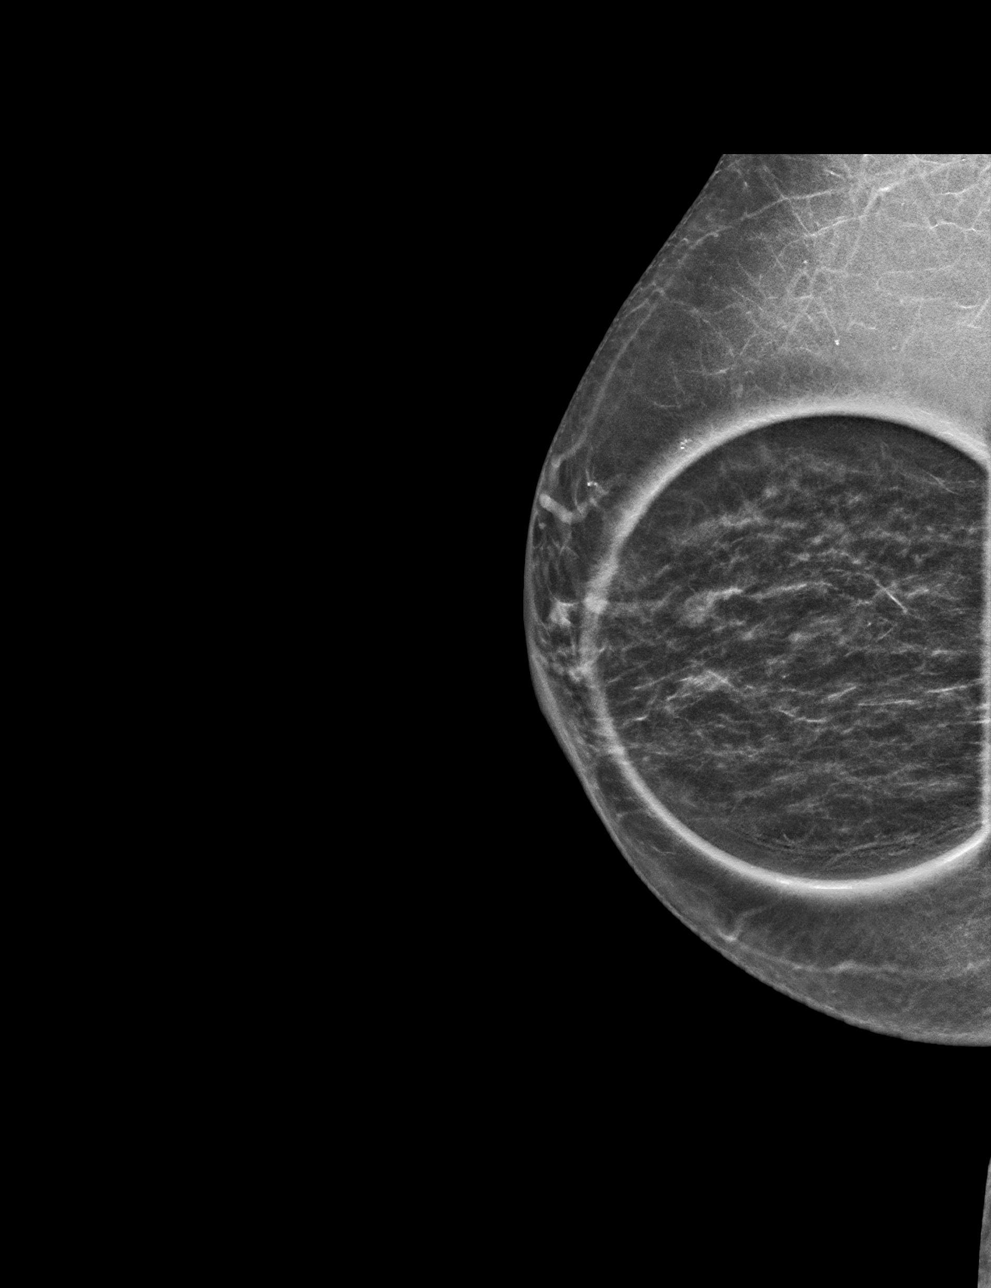

[R CC tomo · tomo slice 23/45.0]
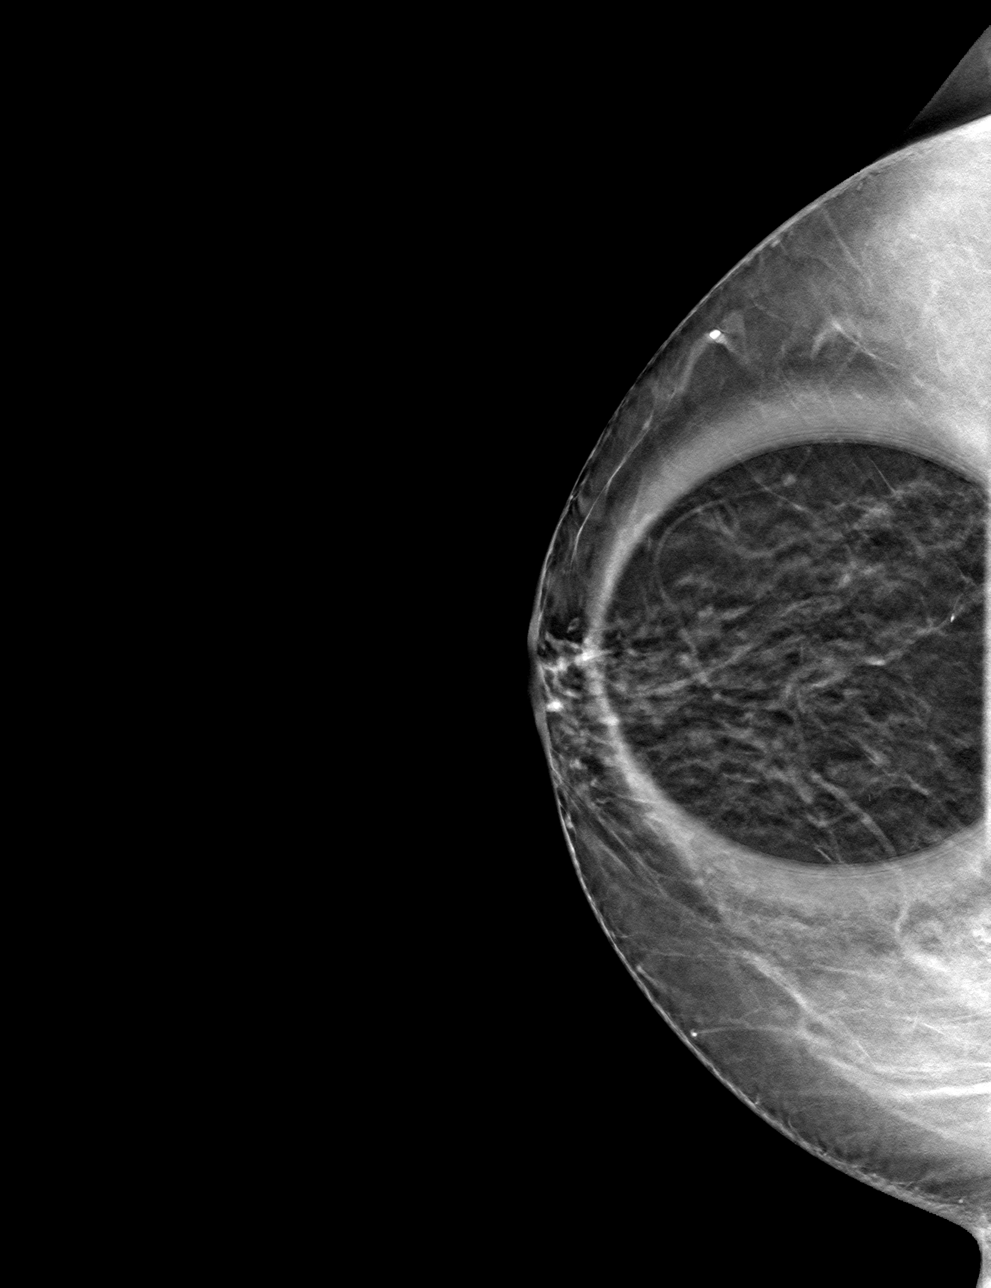

[R MLO tomo · tomo slice 24/47.0]
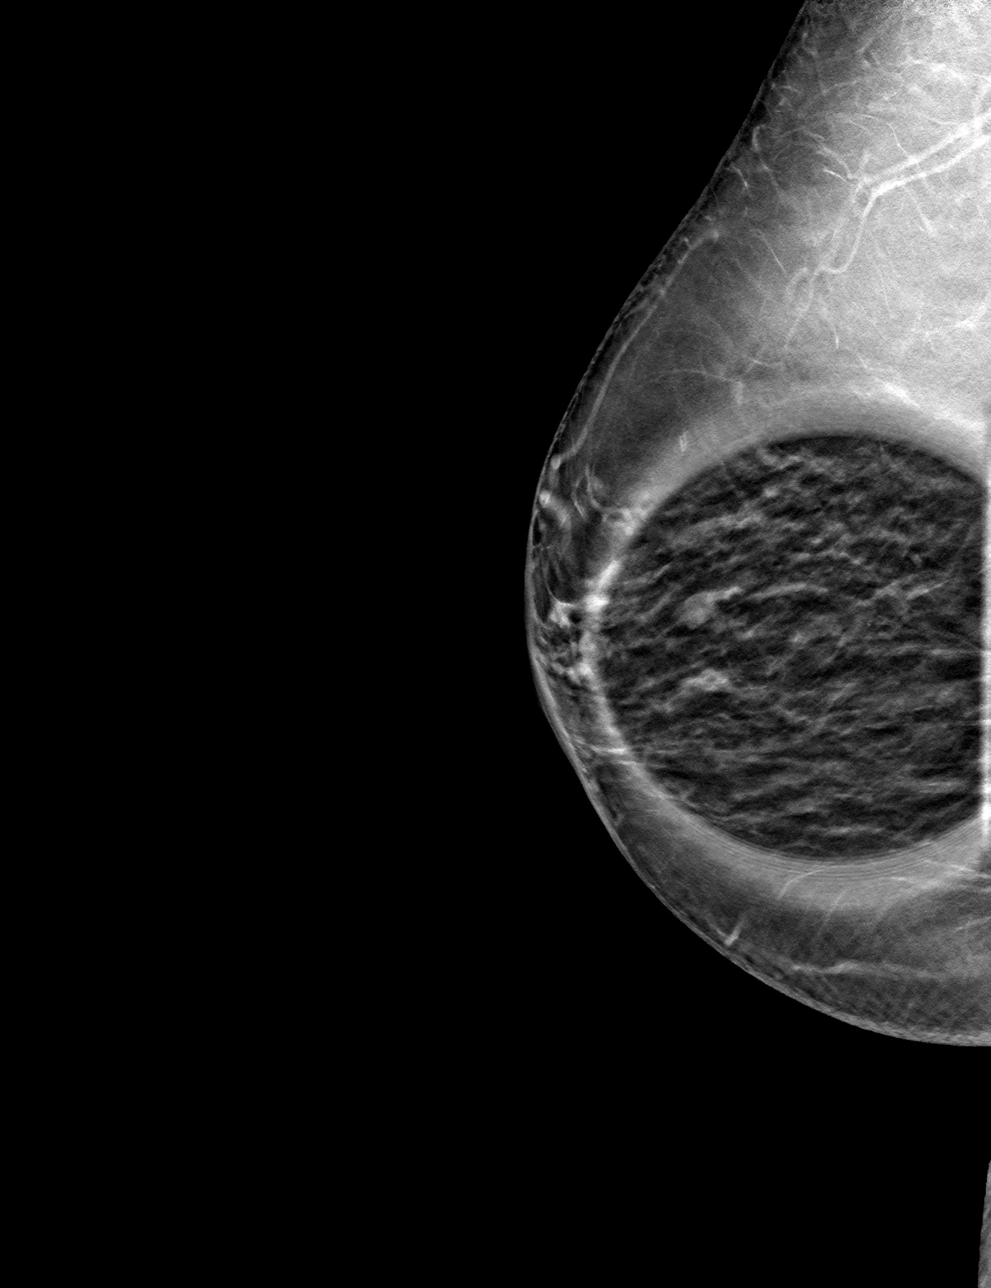

[4 of 12 positions shown; findings below may reference images not displayed]

ACR Breast Density Category b: There are scattered areas of
fibroglandular density.
FINDINGS: Spot compression tomosynthesis images reveal a persistent oval
obscured mass in the anterior retroareolar right breast, seen only
on the MLO view.

Mammographic images were processed with CAD.

Ultrasound of the right breast at [DATE], 1 cm from the nipple
demonstrates an anechoic oval circumscribed mass measuring 8 x 3 x 7
mm.
IMPRESSION: The right breast mass at [DATE] corresponds with a benign cyst.

RECOMMENDATION:
Screening mammogram in one year.(Code:[I2])

I have discussed the findings and recommendations with the patient.
If applicable, a reminder letter will be sent to the patient
regarding the next appointment.

BI-RADS CATEGORY  2: Benign.

## 2019-04-29 IMAGING — US US BREAST*R* LIMITED INC AXILLA
1 series · 5 of 5 positions shown · non-contrast
Comparison: Previous exam(s).

CLINICAL DATA: Screening recall for a possible right breast mass.

EXAM:
DIGITAL DIAGNOSTIC RIGHT MAMMOGRAM WITH CAD AND TOMO
ULTRASOUND RIGHT BREAST

[Series 1: us breast*right* limited inc axilla · 0.06mm/px · 5 of 5 slices shown]
[im 1/5]
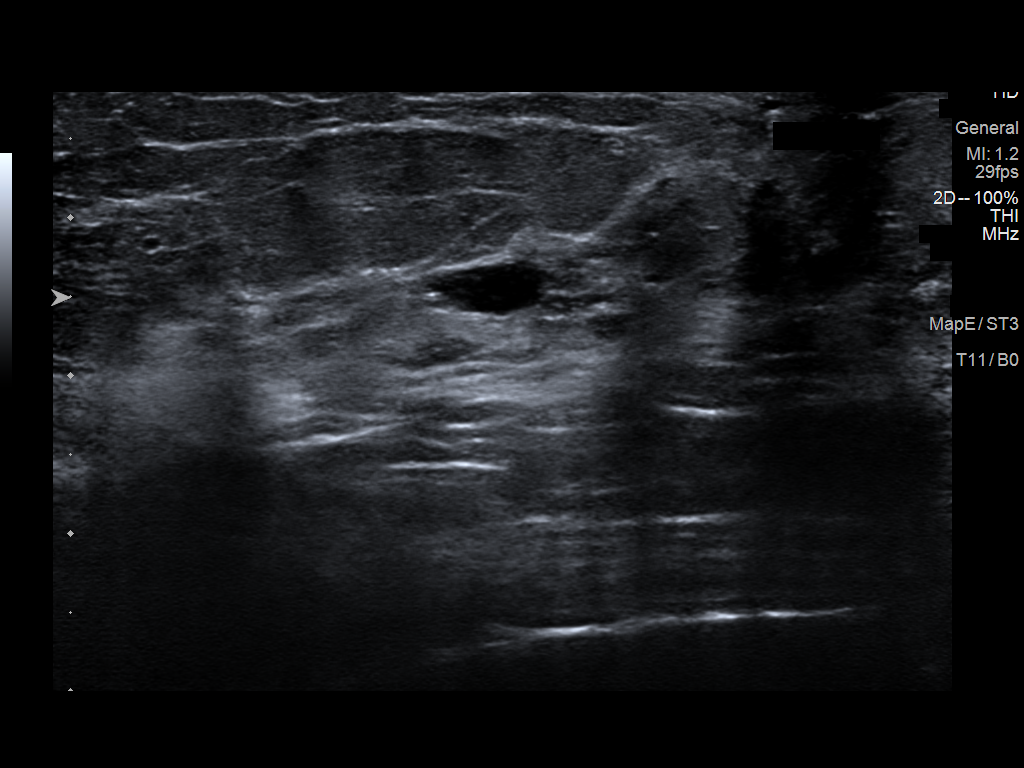
[im 2/5]
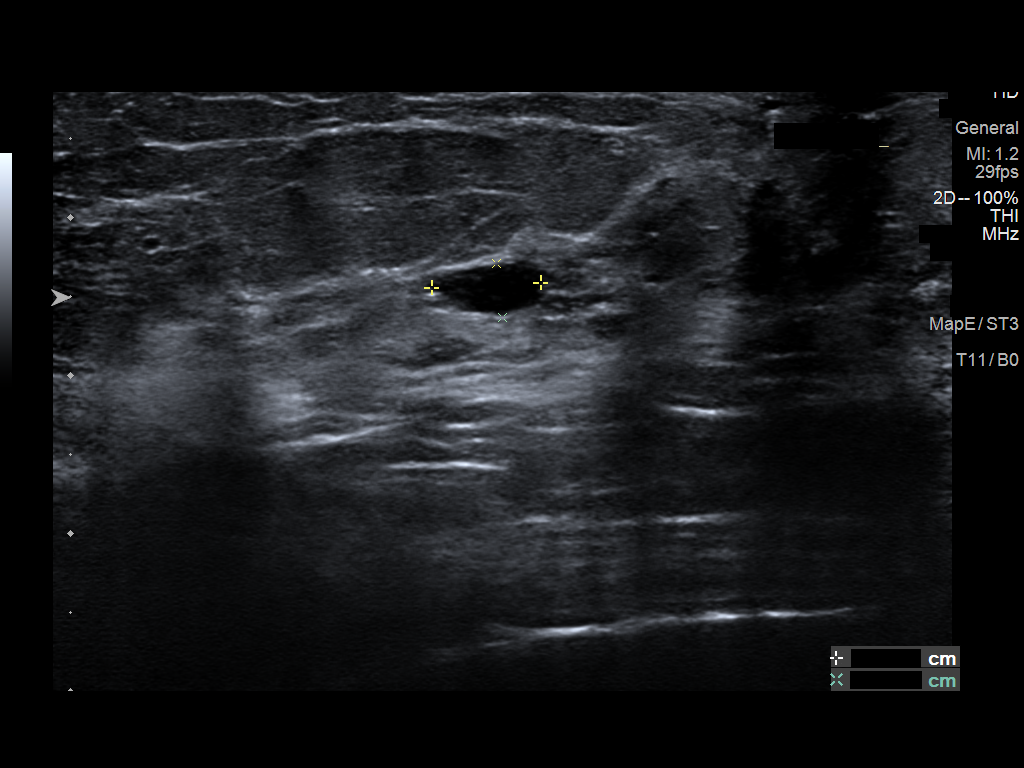
[im 3/5]
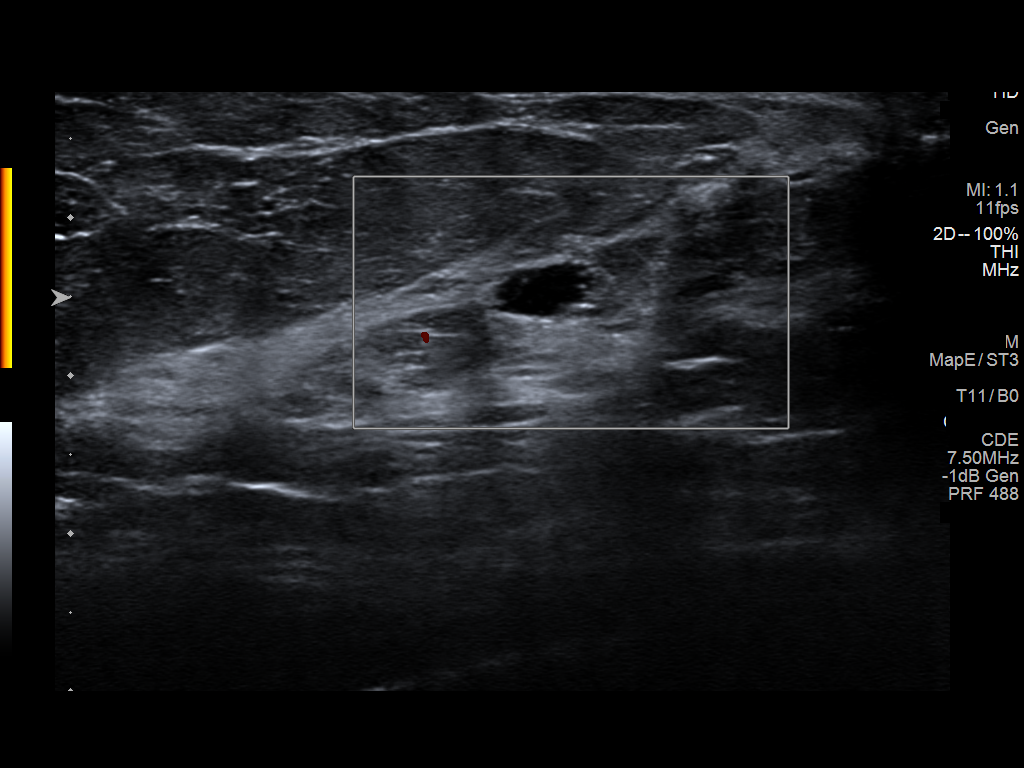
[im 4/5]
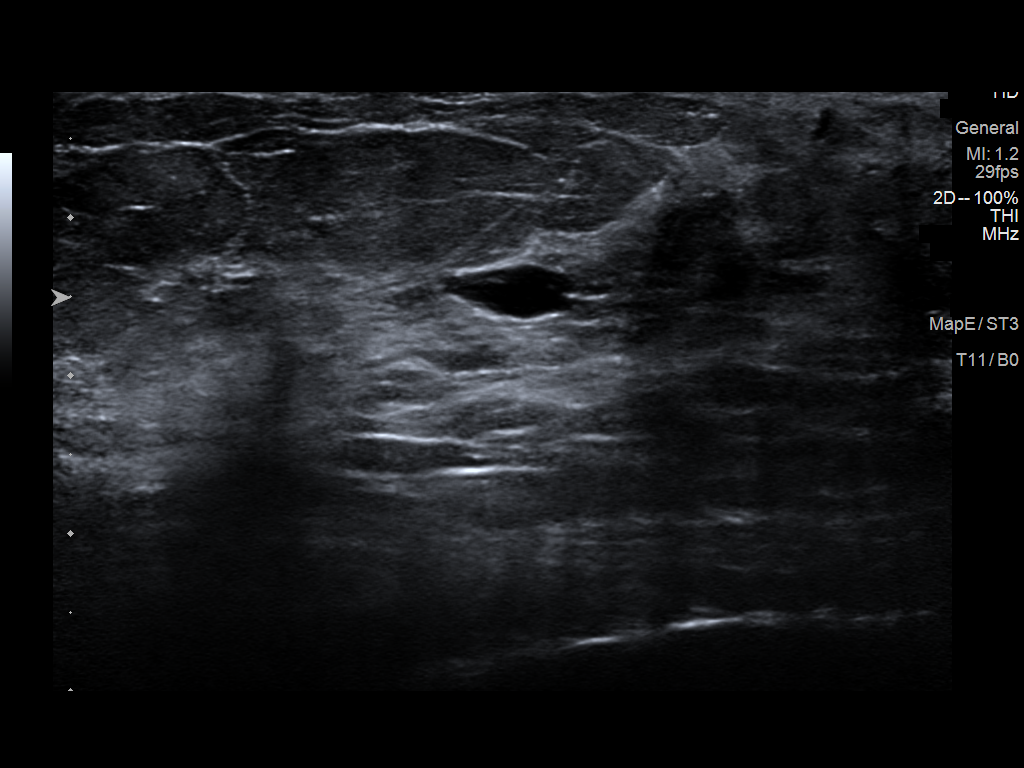
[im 5/5]
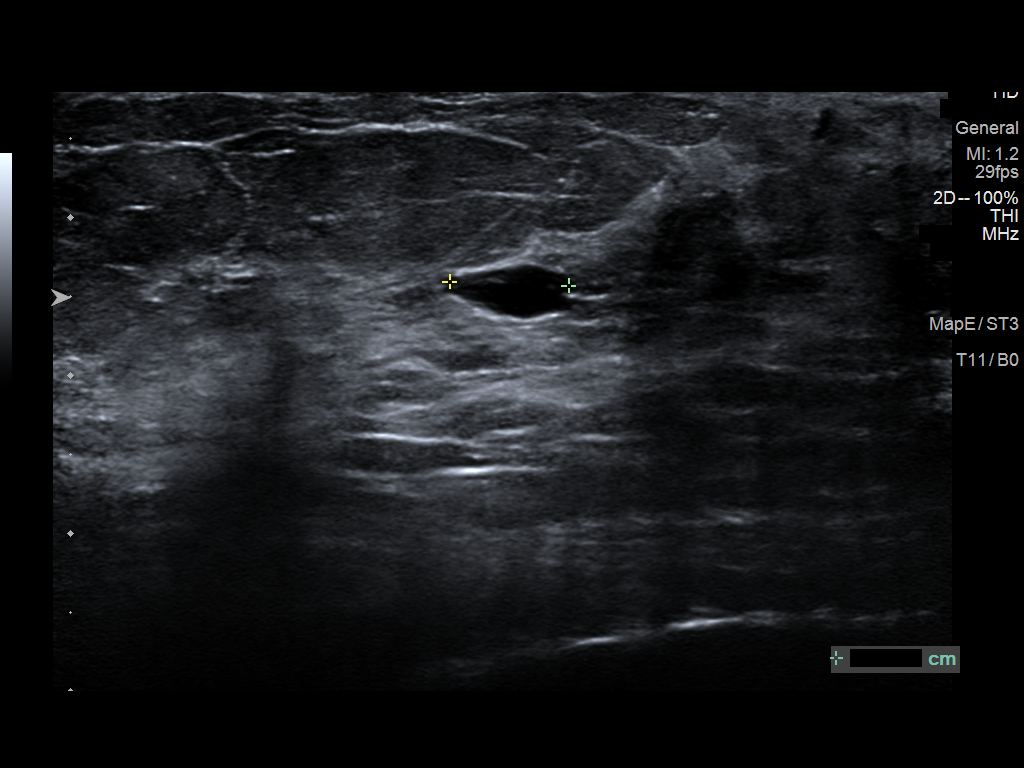

[5 of 5 positions shown; findings below may reference images not displayed]

ACR Breast Density Category b: There are scattered areas of
fibroglandular density.
FINDINGS: Spot compression tomosynthesis images reveal a persistent oval
obscured mass in the anterior retroareolar right breast, seen only
on the MLO view.

Mammographic images were processed with CAD.

Ultrasound of the right breast at [DATE], 1 cm from the nipple
demonstrates an anechoic oval circumscribed mass measuring 8 x 3 x 7
mm.
IMPRESSION: The right breast mass at [DATE] corresponds with a benign cyst.

RECOMMENDATION:
Screening mammogram in one year.(Code:[I2])

I have discussed the findings and recommendations with the patient.
If applicable, a reminder letter will be sent to the patient
regarding the next appointment.

BI-RADS CATEGORY  2: Benign.

## 2019-04-29 NOTE — Progress Notes (Signed)
Patient referred to [facility] by the [facility] due to recommending additional imaging of the right breast. Screening mammogram completed 04/07/2019.  Pap Smear: Pap smear not completed today. Last Pap smear was 02/11/2018 at [facility] and normal. Per patient has a history of an abnormal Pap smear 30 years ago that a colposcopy was completed for follow-up. Patient stated all Pap smears have been normal since colposcopy and has had at least three normal Pap smears. Last Pap smear result is in Epic.  Physical exam: Breasts Breasts symmetrical. No skin abnormalities bilateral breasts. No nipple retraction bilateral breasts. No nipple discharge bilateral breasts. No lymphadenopathy. No lumps palpated bilateral breasts. No complaints of pain or tenderness on exam. Referred patient to the [facility] for a right breast diagnostic mammogram and possible ultrasound. Appointment scheduled for Thursday, April 29, 2019 at 1400.        Pelvic/Bimanual No Pap smear completed today since last Pap smear was 02/11/2018. Pap smear not indicated per BCCCP guidelines.   Smoking History: Patient is a current smoker. Discussed smoking cessation with patient. Referred to the [facility] Quitline and gave resources to the free smoking cessation classes at [facility].  Patient Navigation: Patient education provided. Access to services provided for patient through [facility] program.   Colorectal Cancer Screening: Patient had a colonoscopy completed 02/12/2018. No complaints today.   Breast and Cervical Cancer Risk Assessment: Patient has a family history of a paternal aunt having breast cancer. patient has no known genetic mutations or history of radiation treatment to the chest before age 51. Per patient has a history of cervical dysplasia. Patient has no history of being immunocompromised or DES exposure in-utero.  Risk Assessment    Risk Scores      04/29/2019   Last edited by:  Loletta Parish, RN   5-year risk: 1.5 %   Lifetime risk: 6.7 %

## 2019-04-29 NOTE — Patient Instructions (Signed)
Explained breast self awareness with [facility]. Patient did not need a Pap smear today due to last Pap smear was 02/11/2018. Let her know BCCCP will cover Pap smears every 3 years unless has a history of abnormal Pap smears. Referred patient to the [facility] for a right breast diagnostic mammogram and possible ultrasound. Appointment scheduled for Thursday, April 29, 2019 at 1400. Patient aware of appointment and will be there. Discussed smoking cessation with patient. Referred to the [facility] Quitline and gave resources to the free smoking cessation classes at [facility]. Zanovia E Cowgill verbalized understanding.  Cherylene Ferrufino, Arvil Chaco, RN 1:23 PM

## 2019-05-12 ENCOUNTER — Other Ambulatory Visit: Payer: Self-pay

## 2019-05-12 ENCOUNTER — Other Ambulatory Visit (INDEPENDENT_AMBULATORY_CARE_PROVIDER_SITE_OTHER): Payer: Self-pay

## 2019-05-12 DIAGNOSIS — L989 Disorder of the skin and subcutaneous tissue, unspecified: Secondary | ICD-10-CM

## 2019-05-12 NOTE — Telephone Encounter (Signed)
Sorry for delay. Patient came in this morning for labs. Cost for lyme test is $226 account price and $27 for RPR. Per Dr. Amil Amen okay to go ahead and send labs out

## 2019-05-13 LAB — LYME AB/WESTERN BLOT REFLEX
LYME DISEASE AB, QUANT, IGM: <0.8 {index} (ref 0.00–0.79)
Lyme IgG/IgM Ab: <0.91 {ISR} (ref 0.00–0.90)

## 2019-05-13 LAB — RPR QUALITATIVE: RPR Ser Ql: NONREACTIVE

## 2019-08-11 ENCOUNTER — Ambulatory Visit: Payer: Self-pay | Admitting: Internal Medicine

## 2019-08-11 ENCOUNTER — Other Ambulatory Visit: Payer: Self-pay

## 2019-08-11 ENCOUNTER — Encounter: Payer: Self-pay | Admitting: Internal Medicine

## 2019-08-11 VITALS — BP 136/82 | HR 76 | Resp 12 | Ht 63.25 in | Wt 154.0 lb

## 2019-08-11 DIAGNOSIS — E782 Mixed hyperlipidemia: Secondary | ICD-10-CM

## 2019-08-11 DIAGNOSIS — I10 Essential (primary) hypertension: Secondary | ICD-10-CM

## 2019-08-11 DIAGNOSIS — L989 Disorder of the skin and subcutaneous tissue, unspecified: Secondary | ICD-10-CM

## 2019-08-11 NOTE — Progress Notes (Signed)
    Subjective:    Patient ID: Jacqueline Fox, female   DOB: 1956/05/07, 64 y.o.   MRN: [id number]   HPI   1.  Skin Lesions:  We sent Lymes testing and RPR.  ACE testing not done due to cost.  She never went to get the CXR done last September as recommended.  She states she is able to go to get the xray now and then apply for financial assistance.   She has not heard back from Dermatology.   Her orange card is not up to date--not clear when it went out of date. Continues with the derma smoothe and finds this helpful.   Not clear if Derm felt this was discoid lupus ultimately. Dr. Amy [place].   I do not see anything but her message in the chart regarding plans/treatment.  2.  Hypertension:  Doing well with Amlodipine.  Current Meds  Medication Sig  . amLODipine (NORVASC) 10 MG tablet Take 1 tablet (10 mg total) by mouth daily.  . Fluocinolone Acetonide Scalp (DERMA-SMOOTHE/FS SCALP) 0.01 % OIL Apply to scalp at bedtime as per instructions  . Multiple Vitamin (MULTIVITAMIN) tablet Take 1 tablet by mouth daily.  Marland Kitchen triamcinolone (KENALOG) 0.025 % ointment Apply scant amount to affected areas once daily as needed.   No Known Allergies   Review of Systems    Objective:   BP 136/82 (BP Location: Left Arm, Patient Position: Sitting, Cuff Size: Normal)   Pulse 76   Resp 12   Ht 5' 3.25" (1.607 m)   Wt 154 lb (69.9 kg)   LMP 02/27/2011   BMI 27.06 kg/m   Physical Exam   NAD Lungs:  CTA CV: RRR without murmur or rub.  Radial and DP pulses normal and equal LE:  No edema.   Assessment & Plan  1.  Skin Lesions:  Not clear what her ultimate diagnosis and treatment plan is and whether the patient was to do something for follow up Jacqueline Fox to call Jacqueline Fox office for more information Patient will obtain CXR.  2.  Hypertension:  Controlled.   Continue Amlodipine.  3.  Hyperlipidemia:  Improved with recheck in October with a very high HDL.

## 2019-08-17 ENCOUNTER — Ambulatory Visit
Admission: RE | Admit: 2019-08-17 | Discharge: 2019-08-17 | Disposition: A | Discharge status: Home / Self Care | Payer: No Typology Code available for payment source | Source: Ambulatory Visit | Attending: Internal Medicine | Admitting: Internal Medicine

## 2019-08-17 DIAGNOSIS — L989 Disorder of the skin and subcutaneous tissue, unspecified: Secondary | ICD-10-CM

## 2019-08-17 IMAGING — CR DG CHEST 2V
2 series · 2 of 2 positions shown · non-contrast
Comparison: [DATE]

CLINICAL DATA: Skin lesions sent to evaluate for signs of
sarcoidosis.

EXAM:
CHEST - 2 VIEW

[w chest pa]
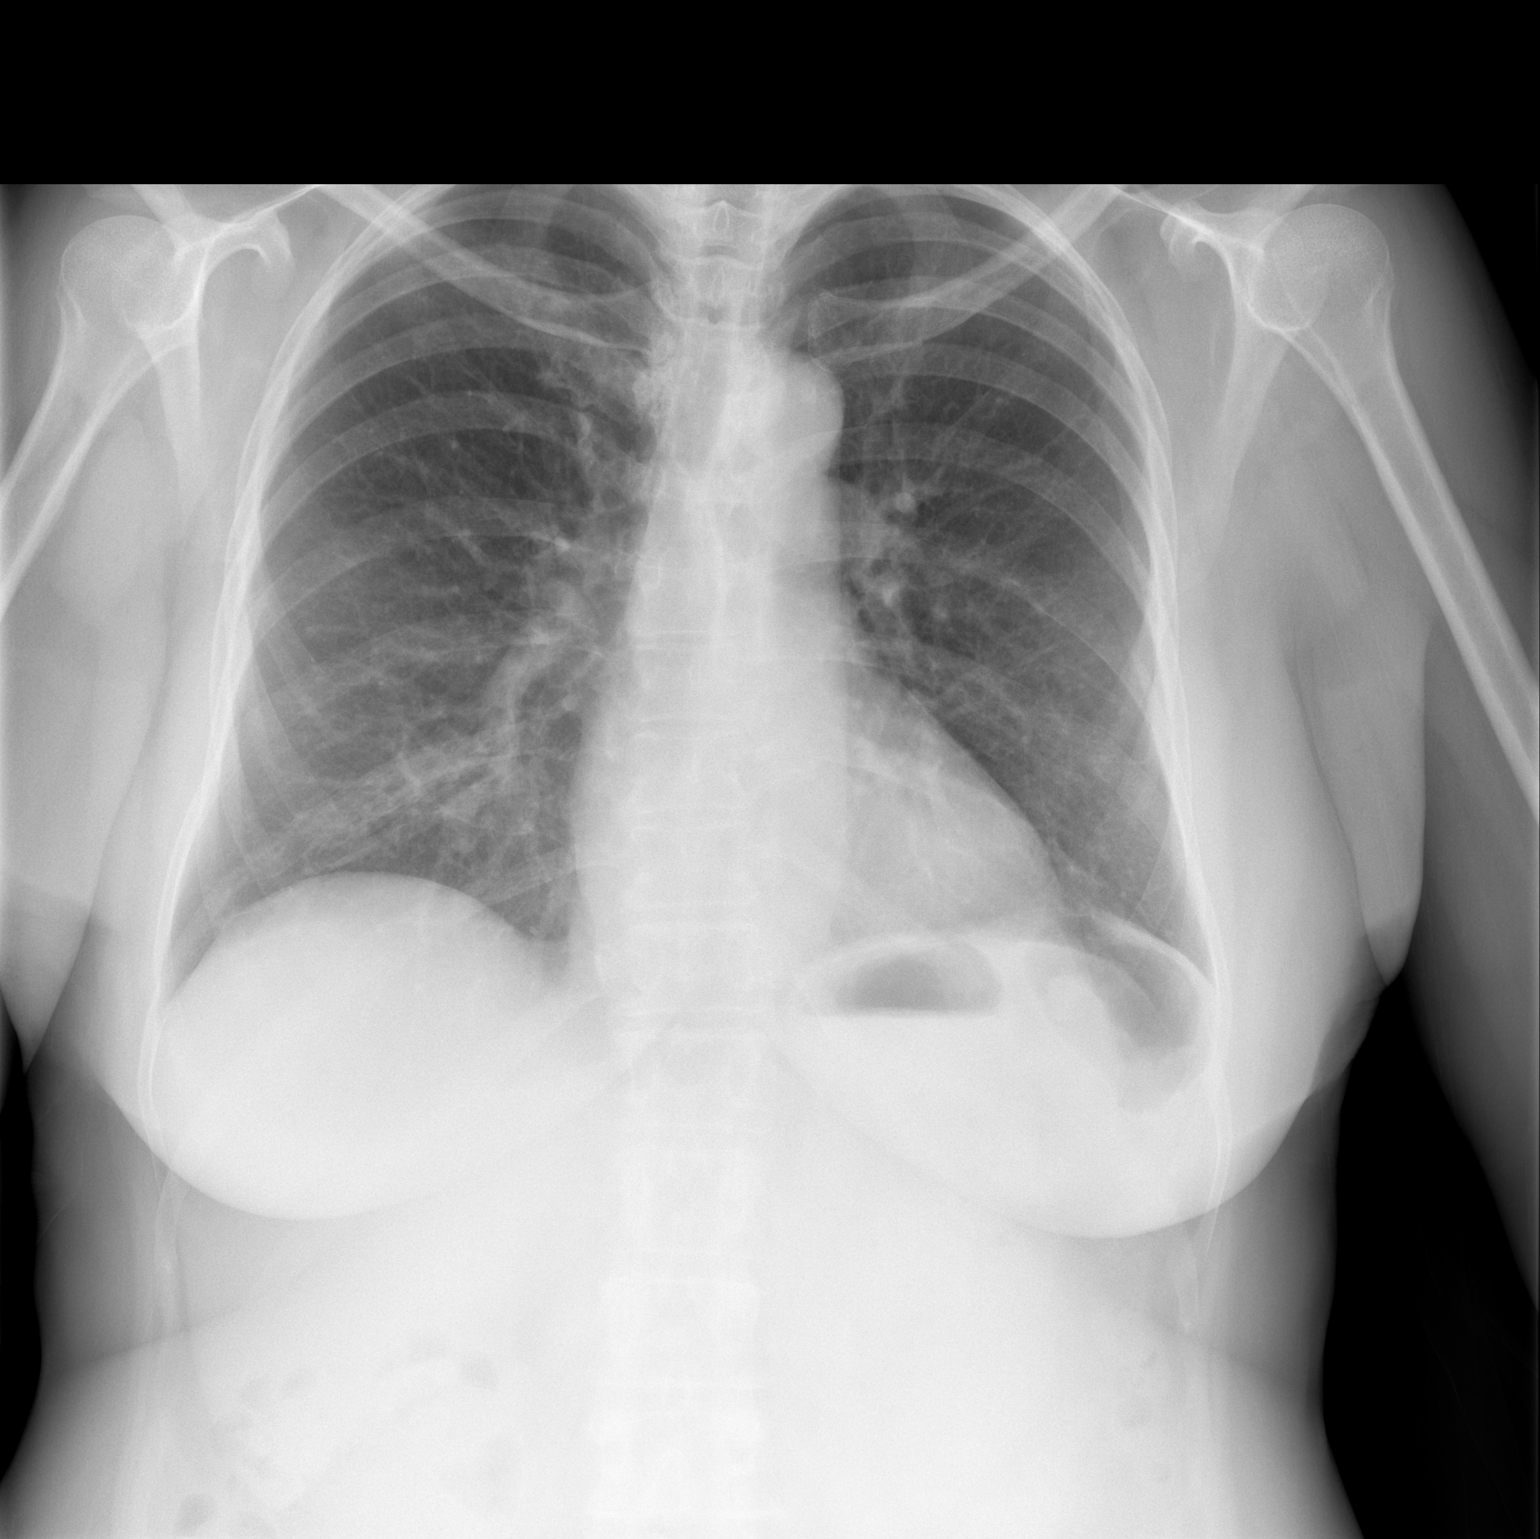

[w chest lat]
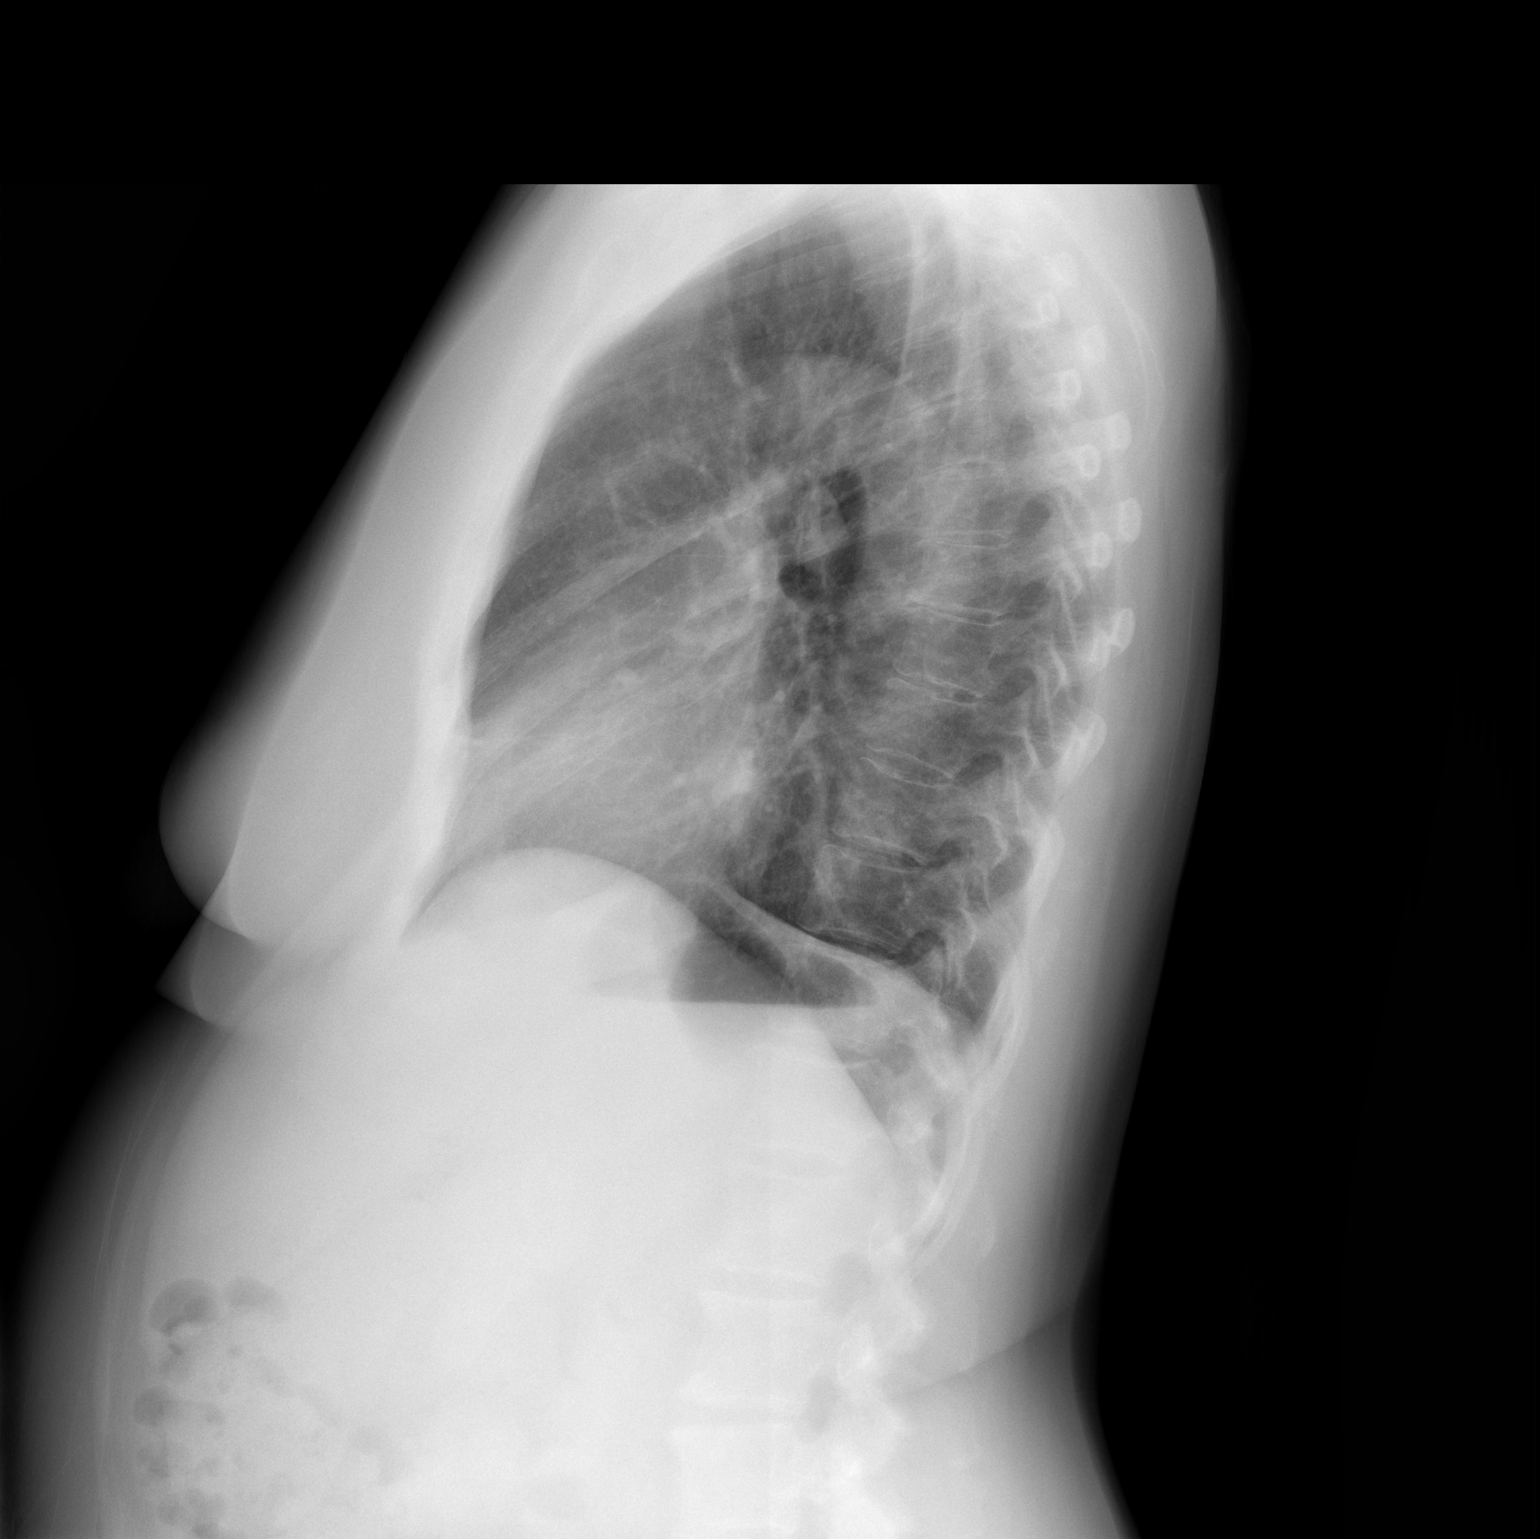

[2 of 2 positions shown; findings below may reference images not displayed]

FINDINGS: There is no evidence of acute infiltrate, pleural effusion or
pneumothorax. The heart size and mediastinal contours are within
normal limits. The visualized skeletal structures are unremarkable.
IMPRESSION: No active cardiopulmonary disease.

## 2019-09-22 ENCOUNTER — Other Ambulatory Visit: Payer: Self-pay

## 2019-09-22 MED ORDER — AMLODIPINE BESYLATE 10 MG PO TABS: 10.0000 mg | ORAL_TABLET | Freq: Every day | ORAL | 11 refills | Status: DC

## 2020-02-11 ENCOUNTER — Encounter: Payer: Self-pay | Admitting: Internal Medicine

## 2020-02-11 ENCOUNTER — Ambulatory Visit: Payer: Self-pay | Admitting: Internal Medicine

## 2020-02-11 VITALS — BP 120/78 | HR 79 | Resp 12 | Ht 63.25 in | Wt 147.5 lb

## 2020-02-11 DIAGNOSIS — E782 Mixed hyperlipidemia: Secondary | ICD-10-CM

## 2020-02-11 DIAGNOSIS — K08139 Complete loss of teeth due to caries, unspecified class: Secondary | ICD-10-CM

## 2020-02-11 DIAGNOSIS — L989 Disorder of the skin and subcutaneous tissue, unspecified: Secondary | ICD-10-CM

## 2020-02-11 DIAGNOSIS — Z1231 Encounter for screening mammogram for malignant neoplasm of breast: Secondary | ICD-10-CM

## 2020-02-11 DIAGNOSIS — Z0000 Encounter for general adult medical examination without abnormal findings: Secondary | ICD-10-CM

## 2020-02-11 DIAGNOSIS — Z800 Family history of malignant neoplasm of digestive organs: Secondary | ICD-10-CM | POA: Insufficient documentation

## 2020-02-11 DIAGNOSIS — Z801 Family history of malignant neoplasm of trachea, bronchus and lung: Secondary | ICD-10-CM

## 2020-02-11 DIAGNOSIS — I10 Essential (primary) hypertension: Secondary | ICD-10-CM

## 2020-02-11 NOTE — Progress Notes (Signed)
Subjective:    Patient ID: Jacqueline Fox, female   DOB: Jun 14, 1955, 64 y.o.   MRN: [id number]   HPI   CPE without pap  1.  Pap:  Last 02/11/2018 and normal.  2.  Mammogram:  Last mammogram, including specific views and U/S of right breast showed benign cyst in 11 and 04/2019.  Paternal aunt with history of breast cancer in her early 41s.   3.  Osteoprevention:  1 serving of dairy daily.  She is willing to drink 3 cups milk daily.  Not physically active.  In a funk.  Caring for her mother all day and then home alone and does nothing.  Would like to speak with someone.  Used to walk at [employer].    4.  Guaiac Cards:  Last performed 2020 and negative x 3.   5.  Colonoscopy:  Last colonoscopy 02/2018 with Dr. Cristina Gong without polyps.  Family history of colon cancer--father and niece.  6.  Immunizations:   Immunization History  Administered Date(s) Administered   Influenza,inj,Quad PF,6+ Mos 02/18/2018, 02/17/2019   Influenza-Unspecified 02/09/2020   Moderna SARS-COVID-2 Vaccination 07/12/2019, 08/09/2019   Tdap 08/20/2017     7.  Glucose/Cholesterol:  Glucose fine in past.  Mild hypercholesterolemia with high HDL.   Lipid Panel     Component Value Date/Time   CHOL 208 (H) 02/22/2019 0846   TRIG 152 (H) 02/22/2019 0846   HDL 68 02/22/2019 0846   LDLCALC 114 (H) 02/22/2019 0846   LABVLDL 26 02/22/2019 0846     Current Meds  Medication Sig   amLODipine (NORVASC) 10 MG tablet Take 1 tablet (10 mg total) by mouth daily.   Fluocinolone Acetonide Scalp (DERMA-SMOOTHE/FS SCALP) 0.01 % OIL Apply to scalp at bedtime as per instructions   Multiple Vitamin (MULTIVITAMIN) tablet Take 1 tablet by mouth daily.   triamcinolone (KENALOG) 0.025 % ointment Apply scant amount to affected areas once daily as needed.   No Known Allergies   Past Medical History:  Diagnosis Date   Family history of breast cancer    Family history of cholangiocarcinoma    Sister   Family history of  colon cancer    Patient has had genetic testing and not found to have genetic predisposition.  Recommendations for cancer screenings as usual per Tamsen Meek her note 2019 please   Family history of lung cancer    Brother   Family history of prostate cancer    Genetic testing 11/27/2017   Negative genetic testing on the Multicancer panel.  The Multi-Gene Panel offered by [facility] includes sequencing and/or deletion duplication testing of the following 84 genes: AIP, ALK, APC, ATM, AXIN2,BAP1,  BARD1, BLM, BMPR1A, BRCA1, BRCA2, BRIP1, CASR, CDC73, CDH1, CDK4, CDKN1B, CDKN1C, CDKN2A (p14ARF), CDKN2A (p16INK4a), CEBPA, CHEK2, CTNNA1, DICER1, DIS3L2, EGFR (c.2369C>T, p.Thr790Met variant   Hypertension     Past Surgical History:  Procedure Laterality Date   COLONOSCOPY WITH PROPOFOL N/A 02/12/2018   Procedure: COLONOSCOPY WITH PROPOFOL;  Surgeon: Ronald Lobo, MD;  Location: [facility];  Service: Endoscopy;  Laterality: N/A;    Family History  Problem Relation Age of Onset   Diabetes Mother    Heart disease Mother        CHF   Hypertension Mother    Kidney disease Mother    Arthritis Mother        OA   Dementia Mother    Cancer Father 50       colon and prostate  Cancer Sister 64       ?cholangiocarcinoma   Cancer Brother 66       lung--smoker  also, prostate CA when 85-54 yo   Kidney disease Son        Stage IV CKD from poorly controlled Htn   Hypertension Son    Cancer Brother 61       prostate   Cancer Brother 46       Prostate--treated successfuly   Cancer Other 39       colon--sister's daughter (sister with "gallbladder duct" cancer)   Cancer Paternal Aunt 64       Breast Cancer   Breast cancer Paternal Aunt    Cancer Paternal Aunt        Bone cancer   Stroke Paternal Grandmother    Prostate cancer Cousin        mat first cousin   Social History   Socioeconomic History   Marital status: Soil scientist    Spouse name: Serita Kyle   Number of  children: 4   Years of education: 14   Highest education level: Associate degree: academic program  Occupational History   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  Vaping Use   Vaping Use: Never used  Substance and Sexual Activity   Alcohol use: Yes    Comment: Bottle of wine over 3 day weekend.     Drug use: Yes    Types: Marijuana    Comment: edibles and smoking.   Sexual activity: Not Currently  Other Topics Concern   Not on file  Social History Narrative   Has been together with female partner for 28 years (2020).   He does not smoke.     Currently just lives with her partner, [place]   Social Determinants of Health   Financial Resource Strain: Not on [employer] Insecurity: Not on file  Transportation Needs: Not on file  Physical Activity: Not on file  Stress: Not on file  Social Connections: Not on file  Intimate Partner Violence: Not on file      Review of Systems  Constitutional: Positive for unexpected weight change (Feels her weight is down, but has a history of going up and down about 10 lbs.). Negative for appetite change.  HENT: Positive for dental problem. Negative for hearing loss, rhinorrhea and sore throat.   Eyes: Negative for visual disturbance (Bifocals.).  Respiratory: Negative for cough and shortness of breath.   Cardiovascular: Negative for chest pain, palpitations and leg swelling.  Gastrointestinal: Negative for abdominal pain, blood in stool (No melena), constipation and diarrhea.  Genitourinary: Negative for dysuria and vaginal discharge.  Musculoskeletal: Negative for arthralgias.  Skin: Positive for rash (healing up--only new lesion on chest and on left nipple. ).   Neurological: Negative for weakness and numbness.  Psychiatric/Behavioral: Positive for dysphoric mood (In a funk and would like counseling.  Takes care of her mother all day, then goes home.  Smoking and eating MJ regularly.  No physical acitivity.  COVID has limited her social interactions as well.  Female partner not very active as well.  ). The patient is  nervous/anxious.       Objective:   BP 120/78 (BP Location: Right Arm, Patient Position: Sitting, Cuff Size: Normal)   Pulse 79   Resp 12   Ht 5' 3.25" (1.607 m)   Wt 147 lb 8 oz (66.9 kg)   LMP 02/27/2011   BMI 25.92 kg/m   Physical Exam Constitutional:      Appearance: Normal appearance.  HENT:     Head: Normocephalic and atraumatic.     Right Ear: Tympanic membrane, ear canal and external ear normal.     Left Ear: Tympanic membrane, ear canal and external ear normal.     Nose: Nose normal.     Mouth/Throat:     Mouth: Mucous membranes are moist.     Pharynx: Oropharynx is clear.     Comments: Caried teeth. Eyes:     Extraocular Movements: Extraocular movements intact.     Conjunctiva/sclera: Conjunctivae normal.     Right eye: Right conjunctiva is not injected.     Left eye: Left conjunctiva is not injected.     Pupils: Pupils are equal, round, and reactive to light.     Comments: Discs charp bilaterally.  Neck:     Thyroid: No thyroid mass or thyromegaly.  Cardiovascular:     Rate and Rhythm: Normal rate and regular rhythm.     Heart sounds: S1 normal and S2 normal. No murmur heard.   No friction rub. No S3 or S4 sounds.     Comments: No carotid bruits.  Carotid, radial, femoral, DP and PT pulses normal and equal.    Pulmonary:     Effort: Pulmonary effort is normal.     Breath sounds: Normal breath sounds.  Chest:  Breasts:    Right: No inverted nipple, mass, nipple discharge or tenderness.     Left: No inverted nipple, mass, nipple discharge or tenderness.  Abdominal:     General: Bowel sounds are  normal.     Palpations: Abdomen is soft. There is no hepatomegaly, splenomegaly or mass.     Tenderness: There is no abdominal tenderness.     Hernia: No hernia is present.  Genitourinary:    Comments: Normal external female genitalia.   No uterine or adnexal mass or tenderness. No vaginal discharge. Musculoskeletal:        General: Normal range of motion.     Cervical back: Normal range of motion and neck supple.     Right lower leg: No edema.     Left lower leg: No edema.  Lymphadenopathy:     Head:     Right side of head: No submental or submandibular adenopathy.     Left side of head: No submental or submandibular adenopathy.     Cervical: No cervical adenopathy.     Upper Body:     Right upper body: No supraclavicular or axillary adenopathy.     Left upper body: No supraclavicular or axillary adenopathy.     Lower Body: No right inguinal adenopathy. No left inguinal adenopathy.  Skin:    General: Skin is warm.     Capillary Refill: Capillary refill takes less than 2 seconds.     Comments: Small mildly raised erythematous lesion on upper chest at neck base and left areola.  Neurological:     Mental Status: She is alert.     Cranial Nerves: Cranial nerves are intact.     Sensory: Sensation is intact.     Motor: Motor function is intact.  Coordination: Coordination is intact.     Gait: Gait is intact.     Deep Tendon Reflexes: Reflexes are normal and symmetric.  Psychiatric:        Attention and Perception: Attention normal.        Mood and Affect: Mood normal.        Behavior: Behavior normal.        Thought Content: Thought content normal.        Judgment: Judgment normal.     Assessment & Plan  1.  CPE without pap Mammogram ordered. Guaiac Cards X3 to return in 2 weeks. CBC, CMP, FLP. Recommend checking in on COVID booster recommendations.  2.  Skin lesions:  ?  Discoid Lupus?  Not clear of ultimate diagnosis of skin lesions and recommended treatment.  Called  Dr. Amy Martinique, Dermatology, office ( to request what next step should be .  While her skin condition is fairly quiescent, still having new lesions. [phone])  3.  Dental decay:  dental referral.  4.  Hypertension:  controlled.  5.  Depression/dysthymia:  Warm hand off to Bernerd Pho, SW intern.    6.  Family history of cancers:  genetic work up without obviously concerning mutations.  See recommendations in [facility] documentation.

## 2020-02-11 NOTE — Patient Instructions (Signed)
Can google "advance directives, [place]"  And bring up form from Secretary of State. Print and fill out Or can go to "5 wishes"  Which is also in Spanish and fill out--this costs $5--perhaps easier to use. Designate a Medical Power of Attorney to speak for you if you are unable to speak for yourself when ill or injured  

## 2020-02-15 ENCOUNTER — Other Ambulatory Visit: Payer: Self-pay

## 2020-02-21 ENCOUNTER — Other Ambulatory Visit: Payer: Self-pay

## 2020-02-21 DIAGNOSIS — I10 Essential (primary) hypertension: Secondary | ICD-10-CM

## 2020-02-21 DIAGNOSIS — E782 Mixed hyperlipidemia: Secondary | ICD-10-CM

## 2020-02-22 LAB — COMPREHENSIVE METABOLIC PANEL
ALT: 19 IU/L (ref 0–32)
AST: 20 IU/L (ref 0–40)
Albumin/Globulin Ratio: 1.3 (ref 1.2–2.2)
Albumin: 4.7 g/dL (ref 3.8–4.8)
Alkaline Phosphatase: 64 IU/L (ref 44–121)
BUN/Creatinine Ratio: 16 (ref 12–28)
BUN: 9 mg/dL (ref 8–27)
Bilirubin Total: 0.2 mg/dL (ref 0.0–1.2)
CO2: 24 mmol/L (ref 20–29)
Calcium: 10.3 mg/dL (ref 8.7–10.3)
Chloride: 104 mmol/L (ref 96–106)
Creatinine, Ser: 0.55 mg/dL — ABNORMAL LOW (ref 0.57–1.00)
GFR calc Af Amer: 117 mL/min/{1.73_m2} (ref >59)
GFR calc non Af Amer: 102 mL/min/{1.73_m2} (ref >59)
Globulin, Total: 3.6 g/dL (ref 1.5–4.5)
Glucose: 103 mg/dL — ABNORMAL HIGH (ref 65–99)
Potassium: 4.1 mmol/L (ref 3.5–5.2)
Sodium: 142 mmol/L (ref 134–144)
Total Protein: 8.3 g/dL (ref 6.0–8.5)

## 2020-02-22 LAB — LIPID PANEL W/O CHOL/HDL RATIO
Cholesterol, Total: 223 mg/dL — ABNORMAL HIGH (ref 100–199)
HDL: 67 mg/dL (ref >39)
LDL Chol Calc (NIH): 138 mg/dL — ABNORMAL HIGH (ref 0–99)
Triglycerides: 101 mg/dL (ref 0–149)
VLDL Cholesterol Cal: 18 mg/dL (ref 5–40)

## 2020-02-22 LAB — CBC WITH DIFFERENTIAL/PLATELET
Basophils Absolute: 0 10*3/uL (ref 0.0–0.2)
Basos: 1 %
EOS (ABSOLUTE): 0 10*3/uL (ref 0.0–0.4)
Eos: 0 %
Hematocrit: 40 % (ref 34.0–46.6)
Hemoglobin: 13.6 g/dL (ref 11.1–15.9)
Immature Grans (Abs): 0 10*3/uL (ref 0.0–0.1)
Immature Granulocytes: 0 %
Lymphocytes Absolute: 1.8 10*3/uL (ref 0.7–3.1)
Lymphs: 38 %
MCH: 32 pg (ref 26.6–33.0)
MCHC: 34 g/dL (ref 31.5–35.7)
MCV: 94 fL (ref 79–97)
Monocytes Absolute: 0.4 10*3/uL (ref 0.1–0.9)
Monocytes: 8 %
Neutrophils Absolute: 2.6 10*3/uL (ref 1.4–7.0)
Neutrophils: 53 %
Platelets: 312 10*3/uL (ref 150–450)
RBC: 4.25 x10E6/uL (ref 3.77–5.28)
RDW: 12.5 % (ref 11.7–15.4)
WBC: 4.9 10*3/uL (ref 3.4–10.8)

## 2020-02-23 ENCOUNTER — Ambulatory Visit (INDEPENDENT_AMBULATORY_CARE_PROVIDER_SITE_OTHER): Payer: Self-pay | Admitting: Clinical

## 2020-02-23 DIAGNOSIS — F32A Depression, unspecified: Secondary | ICD-10-CM

## 2020-02-24 NOTE — Progress Notes (Signed)
Integrated Behavioral Health Comprehensive Clinical Assessment  MRN: [id number] Name: Jacqueline Fox  Session Time: 1400 - 1500 Total time: 60  Type of Service: Integrated Behavioral Health-Individual Interpretor: No. Interpretor Name and Language: N/A  Presenting problem:  Patient is African American. MSW Intern Lauree Chandler met with client as a referral by provider due to the client feeling nervous and anxious. Intern completed comprehensive clinical assessment to assess symptoms and led to seeking treatment.   Social History:  Who lives in your current household? Patient lives with partner / significant other (boyfriend) How do describe your family relationships? Poor  What are your social supports? Boyfriend; one friend; issues with family members/not close with them What are your hobbies? Doesn't have any; doesn't have much time to do them Do you have any spiritual beliefs? Yes/Christian; regularly attended church before COVID-19; now attends online  Education:  What is your highest level of education? college graduate. Accounting degree Do you have history of developmental delays? No If currently in college or university, current major/program? N/A  Employment/Financial Issues:  retired ; receives Psychologist, prison and probation services History Current/Past arrests, charges, incarcerations, etc: past charge for bouncing a check; nothing else came of it Current DSS/DHHS involvement, including foster care: N/A Current DSS Case Worker name, phone number and email: N/A Past DSS/DHHS involvement: previously received food stamps when children were younger   Medical History:   has a past medical history of Family history of breast cancer, Family history of cholangiocarcinoma, Family history of colon cancer, Family history of lung cancer, Family history of prostate cancer, Genetic testing (11/27/2017), and Hypertension. Primary Care Physician: Mack Hook, MD Date of last physical  exam: 02/2020 Allergies: No Known Allergies Current medications:  Outpatient Encounter Medications as of 02/23/2020  Medication Sig   amLODipine (NORVASC) 10 MG tablet Take 1 tablet (10 mg total) by mouth daily.   Fluocinolone Acetonide Scalp (DERMA-SMOOTHE/FS SCALP) 0.01 % OIL Apply to scalp at bedtime as per instructions   Multiple Vitamin (MULTIVITAMIN) tablet Take 1 tablet by mouth daily.   triamcinolone (KENALOG) 0.025 % ointment Apply scant amount to affected areas once daily as needed.   No facility-administered encounter medications on file as of 02/23/2020.   Have you ever had any serious medication reactions? No  Psychiatric History (mental health or substance abuse)  Have you ever been treated for a mental health/substance use problem? No If "Yes", when were you treated and whom did you see? N/A Dates from-Date to: N/A Provider: N/A Treatment Type: N/A Outcome/Follow Up: N/A  Family History: Is there any history of mental health problems or substance abuse in your family? Yes- multiple siblings both formerly and currently struggled with addiction to crack cocaine  Has anyone in your family been hospitalized for mental health treatment? No   Mental Status:  General appearance/Behavior: Casual Eye contact: Good Motor behavior: Normal Speech: Normal Level of consciousness: Alert Mood: Depressed and Dysphoric Affect: Appropriate Thought process: Coherent Thought content: WNL Perception: Normal Judgment: Good Insight: Present Intellect:  N/A; no delays present/noticed Memory: Within Normal limits Orientation: Full Orientated  Attention/Concentration Adequate  Comments: Patient appeared very sad and emotional; she struggled with keeping her feelings bottled up and now feels the need to speak with someone about them. Her goal is to try and cope with her guilt towards her surviving family members and manage her grief from her deceased father, sister, and brother.    Sleep Usual bedtime is 9 PM Sleeping arrangements: sleeps in her  own room; partner has separate bedroom/space Problems with snoring: No Obstructive sleep apnea is not a concern. Problems with nightmares: No Problems with sleepwalking: No  Trauma History: Have you ever experienced or been exposed to any form of abuse? no Emotional? no Physical? no Sexual/assault? no Neglect? no Have you ever witnessed/been exposed to something traumatic? No  Do you have any current symptoms?  Patient reported feeling stressed and struggling with processing her emotions surrounding her family members.   Substance Abuse:  Do you use alcohol, nicotine or caffeine? tobacco use: unknown tobacco use Have you ever used illicit drugs or abused prescription medications? marijuana If yes?  Substance Type- marijuana Route- smoked Age of first use?  Amount of Use? Frequency? Last use?  Do you have any problems with the following symptoms? No; patient reported use 2-3 times per week and on the weekends; uses marijuana as a coping mechanism Reason for use, any motivation to stop, what is stopping from use? Used as a coping mechanism to help her relax  Risk Assessment: N/A; no risk detected Current danger to self Thoughts of suicide/death:  Self-harming behaviors:    Suicide attempt:  Has plan:    Comments/clarify:       Past danger to self Thoughts of suicide/death:  Self-harming behaviors:    Suicide attempt:  Family history of suicide:    Comments/clarify:      Current danger to others Thoughts to harm others:  Plans to harm others:    Threats to harm others:  Attempt to harm others:    Comments/clarify:      Past danger to others Thoughts to harm others:  Plans to harm others:    Threats to harm others:  Attempt to harm others:    Comments/clarify:     RISK TO SELF Low to no risk: X Moderate risk:  Severe risk:   RISK TO OTHERS Low to no risk: X Moderate risk:  Severe risk:     Do you have  any protective factors that keep you from attempting? responsibility to others (children, family)  Psychosocial strengths and stressors: Relationship support/concerns/needs: wants to repair relationships with two surviving brothers; wants to build a better social support system, has trouble connecting in [place] with people; feels isolated  Financial concerns/needs: n/a; no need here Pensions consultant (other sources of income, not from job): social security income  Housing concerns/needs: n/a; no needs present   Diagnosis Depression  Patient outcome?  What do you want out of treatment?   GOALS ADDRESSED: Patient will reduce symptoms of: depression and stress and increase knowledge and/or ability of: coping skills, healthy habits, and stress reduction and also: Increase healthy adjustment to current life circumstances, Increase adequate support systems for patient/family, and Begin healthy grieving over loss             INTERVENTIONS: Interventions utilized: Solution-Focused Strategies, Brief CBT, and Supportive Counseling Standardized Assessments completed: Comprehensive Clinical Assessment   PLAN:  Based on screeners, presenting symptoms and diagnosis social work intern is recommending therapy.  Scheduled next visit: 10/20 at 6 pm  MSW Intern Bernerd Pho

## 2020-03-01 ENCOUNTER — Ambulatory Visit: Payer: Self-pay | Admitting: Clinical

## 2020-03-01 ENCOUNTER — Other Ambulatory Visit (INDEPENDENT_AMBULATORY_CARE_PROVIDER_SITE_OTHER): Payer: Self-pay

## 2020-03-01 ENCOUNTER — Telehealth: Payer: Self-pay

## 2020-03-01 DIAGNOSIS — F411 Generalized anxiety disorder: Secondary | ICD-10-CM

## 2020-03-01 DIAGNOSIS — Z1211 Encounter for screening for malignant neoplasm of colon: Secondary | ICD-10-CM

## 2020-03-01 DIAGNOSIS — Z634 Disappearance and death of family member: Secondary | ICD-10-CM

## 2020-03-01 NOTE — Progress Notes (Signed)
MSW Intern Loletta Specter left a voicemail message for the patient and reminded her that they had a therapy session scheduled on this date, 10/20, at 6 pm.

## 2020-03-02 LAB — POC HEMOCCULT BLD/STL (HOME/3-CARD/SCREEN)
Card #2 Fecal Occult Blod, POC: NEGATIVE
Card #3 Fecal Occult Blood, POC: NEGATIVE
Fecal Occult Blood, POC: NEGATIVE

## 2020-03-02 NOTE — Progress Notes (Signed)
Called--she did all tests on the 19th   Nicky asked her to come back in to retest and perform on 3 separate days

## 2020-03-02 NOTE — Progress Notes (Signed)
   THERAPY PROGRESS NOTE  Session Time: 6 pm - 7 pm; 60 mins Participation Level: Active Behavioral Response: Neat and Well GroomedAlertDysphoric Type of Therapy: Individual Therapy Treatment Goals addressed: Coping   Purpose: MSW Intern Lauree Chandler met with client for routine individual therapy to work towards treatment goals: process her grief of her family members, discuss her relationship challenges with her mother and surviving brothers, and establishing boundaries to protect her mental health.  Intervention: MSW Intern Lauree Chandler met with the patient for their first therapy session together to work on the patient's many emotions about her deceased family members and her relationships' with her surviving mother and two brothers. The Intern began with a brief check in to see how the patient was doing and assessed for any significant events. Intern provided reflective listening skills as the patient discussed her emotions and events of the past week. Intern utilized intervention of Solution Focused and Strength-based approach to decrease anxiety and depressive symptoms. Intern assessed for SI/HI/command psychosis.  Effectiveness: Patient is alert x4 affect. Patient identified she was doing okay but was triggered the past week from her mother's behavior. Patient participated in session sharing how she takes care of her mother during the week days. One evening during the week, her mother had called her and asked her to come back after she had already left. When the patient's mother assumed the patient would return, this triggered the patient because of her history with her surviving family members expecting her to fulfill many additional roles and actions unfairly.   Patient shared how her brother returned to town and tried to assume her responsibilities for the rest of the week instead of allowing her to take a week off of watching their mother later in the year. The patient was proud of how she stood  up to her brother by reinforcing that they were keeping the same schedule so she could take off the week that she wanted instead of one that he preferred. The Intern explained how this was an example of the patient establishing healthy boundaries with her family members and how this was very important for protecting her own mental health.   The patient and the Intern also participated in mutual goal-setting for the patient during the therapeutic sessions. The patient identified that she wanted to improve at setting and enforcing boundaries, being able to lighten her feelings of grief, and explore how she could engage in self-care activities.   During the next session, the patient identified that she wanted to speak about how she needed to reframe her expectations within her romantic relationship with her boyfriend as well. Intervention was effective as patient was able to reflect. Progress towards goal is Ongoing. Patient denied active suicidal/homicidal/active psychosis.  Plan Patient offered next appointment for: 03/09/2020 at 12 pm.  Diagnosis: Bereavement and Generalized Anxiety Disorder    Jacqueline Fox 03/02/2020

## 2020-03-09 ENCOUNTER — Telehealth: Payer: Self-pay

## 2020-03-09 ENCOUNTER — Other Ambulatory Visit (INDEPENDENT_AMBULATORY_CARE_PROVIDER_SITE_OTHER): Payer: Self-pay | Admitting: Internal Medicine

## 2020-03-09 ENCOUNTER — Ambulatory Visit (INDEPENDENT_AMBULATORY_CARE_PROVIDER_SITE_OTHER): Payer: Self-pay | Admitting: Clinical

## 2020-03-09 ENCOUNTER — Other Ambulatory Visit: Payer: Self-pay

## 2020-03-09 DIAGNOSIS — F32A Depression, unspecified: Secondary | ICD-10-CM

## 2020-03-09 DIAGNOSIS — F4321 Adjustment disorder with depressed mood: Secondary | ICD-10-CM

## 2020-03-09 DIAGNOSIS — F411 Generalized anxiety disorder: Secondary | ICD-10-CM

## 2020-03-09 DIAGNOSIS — Z1211 Encounter for screening for malignant neoplasm of colon: Secondary | ICD-10-CM

## 2020-03-09 LAB — POC HEMOCCULT BLD/STL (HOME/3-CARD/SCREEN)
Card #2 Fecal Occult Blod, POC: NEGATIVE
Card #3 Fecal Occult Blood, POC: NEGATIVE
Fecal Occult Blood, POC: NEGATIVE

## 2020-03-09 NOTE — Progress Notes (Signed)
MSW intern Susannah called the patient to remind her of their counseling session scheduled at 12 pm on this date, 03/09/2020. The patient answered and said she would be there.  

## 2020-03-09 NOTE — Progress Notes (Signed)
Guaiac cards dropped off today

## 2020-03-13 ENCOUNTER — Telehealth: Payer: Self-pay | Admitting: Internal Medicine

## 2020-03-13 NOTE — Telephone Encounter (Signed)
Per Ewell Poe at [facility] contacted [facility] regarding appointment on 02/14/2020 @ 8:40 am.

## 2020-03-15 NOTE — Progress Notes (Signed)
THERAPY PROGRESS NOTE  Session Time: 12 pm - 1 pm; 60 minutes Participation Level: Active Behavioral Response: CasualAlertAnxious and Dysphoric Type of Therapy: Individual Therapy Treatment Goals addressed: Anxiety and Coping   Purpose: MSW Intern met with the client for routine individual therapy to work towards treatment goals: coping with her family's losses, managing her grief, and establishing healthy boundaries between her surviving family members.  Intervention: MSW Intern met with the patient and started the session by checking in about how she had been feeling over the past week and assessed for any significant events. The patient said she had been feeling okay and practicing enforcing boundaries between her mother and brother, but she still struggled with how she felt about their requests and behavior. The Intern provided reflective listening skills as the patient discussed frustrations with her family members and her romantic partner. Intern utilized intervention of CBT and Strength-based to decrease anxiety/depressive symptoms. Intern utilized a handout of cognitive restructuring tips and another about healthy versus rigid boundaries. Intern assessed for SI/HI/command psychosis.  Effectiveness: Patient is alert x4 affect. Patient identified she had been feeling impressed with the boundaries she had been establishing with her family members, but she still felt herself getting upset over certain things they would say or do. Patient participated in session sharing that her mother had called her and requested her to do something, but it would interrupt the patient's evening and time to herself. The patient explained how she did not like to be ordered around by her elderly mother because she did as much as she could for her and felt taken advantage of most of the time. The Intern discussed with the client what she could control in terms of her mother's actions and her own emotional reactions  to those behaviors.   The patient shared how she had started journaling after the intern suggested she try it the week before. She said she wrote about when she felt frustrated about her family and tried to look more into those emotions to see where they were coming from and whether they were productive or not for her mental and emotional wellbeing. The Intern praised the patient for doing something for herself and told her that she should be proud of herself for taking the time to process her feelings.   The intern also spoke with the patient about the boundaries she had been setting, both intentionally and not. The intern provided the patient with a handout on healthy versus porous versus rigid boundaries. The intern explained how rigid boundaries had the tendency to keep out people out instead of allowing ourselves to form new relationships. The intern had the patient go in and check off each example that she resonated with or saw herself doing for the categories of the three boundaries. The patient explained why she might have set up the rigid boundaries to protect herself in the past and had hardened herself that way. The intern encouraged her to continue developing that self-awareness and work on taking those walls down bit by bit if she could.  The patient mentioned how her romantic partner had been upsetting her with assuming she would do everything for him in the house they shared. The patient explained how she would tend to shut down after they had a disagreement instead of arguing or speaking to him about it directly. After a few days, the patient said that her boyfriend would typically respond to her silent treatment and apologize. The intern asked whether the patient  would consider having a direct conversation with her partner about what bothered her instead of allowing it to fester within herself. The intern explained the use of "I statements" to express her feelings and the patient said she  would practice using them the following week with her partner.   Intervention was effective as patient was able to reflect. Progress towards goal is Ongoing. Patient denied active suicidal/homicidal/active psychosis.  Plan Patient offered next appointment for: 03/16/2020 at 12 pm.  Diagnosis: Generalized Anxiety Disorder    Roxana Hires 03/15/2020

## 2020-03-15 NOTE — Addendum Note (Signed)
Addended by: Theodoro Clock on: 03/15/2020 02:35 PM   Modules accepted: Level of Service

## 2020-03-15 NOTE — Telephone Encounter (Signed)
MSW intern Loletta Specter called the patient to remind her of their counseling session scheduled at 12 pm on this date, 03/09/2020. The patient answered and said she would be there.

## 2020-03-15 NOTE — Telephone Encounter (Signed)
MSW Intern Susannah left a voicemail message for the patient and reminded her that they had a therapy session scheduled on this date, 10/20, at 6 pm. 

## 2020-03-16 ENCOUNTER — Ambulatory Visit (INDEPENDENT_AMBULATORY_CARE_PROVIDER_SITE_OTHER): Payer: Self-pay | Admitting: Clinical

## 2020-03-16 ENCOUNTER — Telehealth: Payer: Self-pay

## 2020-03-16 ENCOUNTER — Other Ambulatory Visit: Payer: Self-pay

## 2020-03-16 DIAGNOSIS — Z634 Disappearance and death of family member: Secondary | ICD-10-CM

## 2020-03-16 NOTE — Telephone Encounter (Signed)
MSW Intern called the patient to remind her that they had a session scheduled at 12 pm on this date. The patient did not answer, so the intern left a voicemail.

## 2020-03-16 NOTE — Progress Notes (Signed)
THERAPY PROGRESS NOTE  Session Time: 12 - 1 pm Participation Level: Active Behavioral Response: Meticulous, Neat and Well GroomedAlertAnxious and Euthymic Type of Therapy: Individual Therapy Treatment Goals addressed: Anxiety and Coping   Purpose: MSW Intern Lauree Chandler met with the patient for routine individual therapy to work towards treatment goals: coping with her family's losses, managing her grief, working through difficult family relationships, and improving her relationship with her romantic partner.  Intervention: Intern met with the patient for a routine session where they discussed her progress with expressing her emotions and upholding boundaries with her partner and family members. Intern began with a brief check in to see how the patient was doing and  assessed for any significant events. Intern provided reflective listening skills as patient discussed both positive and negative events over the last week, and how she was preparing for the week ahead. Intern utilized intervention of CBT, Solution Focused and Strength-based to decrease anxiety/depressive symptoms. Intern utilized handout of a self-care checklist to discuss how the patient has been taking care of herself and which areas she may need to work on. Intern assessed for SI/HI/command psychosis.  Effectiveness: Patient is alert x4 affect. Patient identified she has been doing well and that her relationship with her partner has greatly improved. Patient participated in sharing by explaining how she had utilized her tools from therapy such as "I statements" and setting boundaries with him when she opened up about how she had been feeling. The patient said that her partner had been very receptive to the conversation and that it had opened up a better dialogue between the two of them. The patient noticed positive changes in her partner's actions that helped to make her life easier, such as taking out the garbage for her or buying food  for their meals in advance instead of expecting her to do everything.     Patient mentioned that she was very much looking forward to the trip she was taking with her nieces the following week, but before she left, she would have to spend the weekend taking care of her mother. She said she was feeling very anxious and was not excited about the weekend because of how her mother treated her while they were together. The Intern asked whether she would feel comfortable using the tools she had learned in therapy with her mother like she did with her partner. The patient said that she was considering it, or at least starting out the weekend with her mother by saying that she wants to focus on their time together instead of her mother solely bragging on the patient's brothers.   The Intern also asked how the patient planned on taking care of herself throughout the weekend while she was with her mother. The patient said that she liked to take walks outside of the house, read on her tablet, and journal. She said that journaling had recently been helping her a lot after the Intern recommended it in one of their first sessions, and that she would definitely be continuing it during the weekend in case anything came up. The Intern also encouraged that the patient play games on her tablet or look into any wellness or mindfulness apps to download. The Intern volunteered to do some research on some of the apps and that she would send her recommendations to the patient via email.  Intervention was effective as patient was able to reflect. Progress towards goal is Ongoing. Patient denied active suicidal/homicidal/active psychosis.  Plan Patient offered  next appointment for: 04/05/2020 at 6 pm  Diagnosis: Bereavement    Jacqueline Fox 03/16/2020

## 2020-03-17 NOTE — Addendum Note (Signed)
Addended by: Theodoro Clock on: 03/17/2020 08:42 AM   Modules accepted: Level of Service

## 2020-03-23 NOTE — Progress Notes (Signed)
CASE MANAGEMENT  DATE: 03/21/2020 TIME: 4 pm WHERE: virtual   MSW Intern Loletta Specter researched and gathered resources on helpful self-care applications for cell phones that the patient would be able to use while she was away on a family trip. Once the Intern had the information, she emailed it to the patient for her to access. The Intern plans on seeing the patient when she returns from her trip on 11/24.

## 2020-04-05 ENCOUNTER — Other Ambulatory Visit: Payer: Self-pay | Admitting: Clinical

## 2020-04-13 ENCOUNTER — Ambulatory Visit: Payer: Self-pay | Admitting: Clinical

## 2020-04-13 ENCOUNTER — Telehealth: Payer: Self-pay

## 2020-04-13 DIAGNOSIS — Z634 Disappearance and death of family member: Secondary | ICD-10-CM

## 2020-04-13 NOTE — Progress Notes (Signed)
   THERAPY PROGRESS NOTE  Session Time: 9 am - 10 am on 04/13/2020 Participation Level: Active Behavioral Response: Casual, Neat and Well GroomedAlertEuthymic Type of Therapy: Individual Therapy Treatment Goals addressed: Anxiety and Communication: speaking with family members and romantic partner from strained relationships    Purpose: MSW intern Lauree Chandler met with client for routine individual therapy to work towards treatment goals: expressing and processing the patient's emotions and improving communication in her romantic relationship.  Intervention: MSW Intern Lauree Chandler met with the patient for her typical therapy session to work on developing her self-awareness and becoming more in touch with her emotional and mental health. The intern began with a brief check in to see how the patient was doing and assessed for any significant events. 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 Intern utilized intervention of Solution Focused, Strength-based and Supportive to decrease anxiety/depressive symptoms. MSW Intern assessed for SI/HI/command psychosis.  Effectiveness: Patient is alert x4 affect. Patient identified she has been frustrated with how her romantic partner had been speaking to her. Patient participated in session sharing how her partner repeatedly wanted to discuss African American history, but she did not share this same interest towards reliving the past. She felt invalidated from their conversation because she was not as interested in discussing the history of oppression towards her racial group as her partner was, and overall, she felt like this was unfair. The patient explained that she did not believe she should be made to feel that way simply because she and her partner had different experiences of being Black individuals in the  [place]   After the patient shared these experiences with her partner, the MSW Intern suggested a different way that she might be able to approach discussing it with him. They practiced using the patient's "I statements" together as a way she could express her emotions with her partner. The patient replied that she felt much more competent in being able to address situations with her partner.  The patient also shared that she had a wonderful time on her family's trip that they took in November. She had a great time getting away with her nieces and felt like it was a powerful way to remember her sister who had passed away a few years ago. When the patient returned from her trip, she said her mother had been very grateful for her presence and work that she did for her. The patient explained that her mother must have missed her and noticed the quality of love and care that she gave her. The Intern celebrated with the patient and told her she was happy to hear this news.    Intervention was effective as patient was able to reflect. Progress towards goal is Ongoing. Patient denied active suicidal/homicidal/active psychosis.  Plan Patient offered next appointment for: 9 am on 04/20/2020  Diagnosis: Substance Induced Mood Disorder and Grief/Bereavement   MSW Intern, Roxana Hires 04/13/2020

## 2020-04-13 NOTE — Telephone Encounter (Signed)
MSW Intern Loletta Specter called the patient to see if she wanted to schedule a counseling appointment on 04/13/2020 when the intern would be in the clinic. The patient agreed and asked to meet at 9 am on that date.

## 2020-04-20 ENCOUNTER — Ambulatory Visit: Payer: Self-pay | Admitting: Clinical

## 2020-04-20 ENCOUNTER — Other Ambulatory Visit: Payer: Self-pay

## 2020-04-20 DIAGNOSIS — Z634 Disappearance and death of family member: Secondary | ICD-10-CM

## 2020-04-20 NOTE — Progress Notes (Signed)
THERAPY PROGRESS NOTE  Session Time: 9 am - 10 am  Participation Level: Active Behavioral Response: Casual, Neat and Well GroomedAlertEuthymic Type of Therapy: Individual Therapy Treatment Goals addressed: Anxiety and Coping   Purpose: MSW Intern Lauree Chandler met with client for routine individual therapy to work towards treatment goals: understanding and expressing her emotions, honestly communicating with others, and establishing and upholding boundaries in her relationships.  Intervention:  Intern began the session with a brief check in and assessed how the patient was doing and for any significant events. The patient stated that she had had a relatively good week and that both her romantic and family relationships seemed to be improving overall, which she was pleased with. Intern utilized intervention of CBT, Solution Focused and Strength-based to decrease anxiety/depressive symptoms. Intern assessed for SI/HI/command psychosis.  Effectiveness: Patient is alert x4 affect. Patient identified she is feeling confident and proud of the relationship she has with her romantic partner that she lives with. She explained how they were still communicating openly with one another about their expectations and feelings. The intern asked whether the patient had approached the conversation about her partner's politics and historical lessons that had bothered her before, but the patient said that it had not come up yet. She revealed that her partner had actually started to refrain from sharing as much as he had before, and she saw this as a sign of improvement.   The patient also disclosed that her mother had given her a birthday card with a small gift attached, and this action meant a lot to her. She explained how her mother does not give gifts too often, and this one felt very meaningful and grateful for all that she did for her mother to care for her. The patient expressed how appreciated she felt now versus  only a few months ago when it seemed like her mother ignored and took for granted all of her efforts.   The Intern asked whether the patient had returned attending her religious services because she had mentioned looking forward to this previously. The patient said that she had attended a service the previous Sunday and she very much enjoyed it. They discussed the balance between wanting to remain safe from COVID-19 exposure while still preserving her mental health by attending events or services that she enjoyed (as safely as possible). The intern encouraged the patient to also discuss this with her partner because he might benefit from the topic since he tended to be more of an introvert and homebody than the patient. The patient agreed that it would be good for him to talk about.  Lastly, the intern asked the patient about how she was maintaining the positive improvements that had occurred recently in her relationship with her partner. The patient explained how she had given him different reminders about things and behaviors they had discussed, and the Intern encouraged her to continue doing so but in a more positive way. She applauded how the patient was still putting in the work with her relationship but to remain mindful of her tone and how she was delivering the reminders. The intern also asked whether the couple did anything special together or bonded in any way, and the patient said that they did not other than being together at home. The intern encouraged the patient to possibly look into ways they could safely go out together and do something or try a new hobby or activity inside. Then they brainstormed different things that the couple  could do together that would align with both partners' interests.  Intervention was effective as patient was able to reflect. Progress towards goal is Ongoing. Patient denied active suicidal/homicidal/active psychosis.  Plan Patient offered next appointment for:  12/16 at 9 am.  Diagnosis: Grief/bereavement   MSW Intern,  Roxana Hires 04/20/2020

## 2020-04-21 NOTE — Addendum Note (Signed)
Addended by: Theodoro Clock on: 04/21/2020 09:22 AM   Modules accepted: Level of Service

## 2020-04-21 NOTE — Addendum Note (Signed)
Addended by: Theodoro Clock on: 04/21/2020 09:13 AM   Modules accepted: Level of Service

## 2020-04-27 ENCOUNTER — Other Ambulatory Visit: Payer: Self-pay | Admitting: Clinical

## 2020-06-01 ENCOUNTER — Other Ambulatory Visit: Payer: Self-pay

## 2020-06-01 ENCOUNTER — Ambulatory Visit: Payer: Self-pay | Admitting: Clinical

## 2020-06-01 DIAGNOSIS — Z634 Disappearance and death of family member: Secondary | ICD-10-CM

## 2020-06-01 NOTE — Progress Notes (Signed)
   THERAPY PROGRESS NOTE  Session Time: 9 am - 10 am; 1 hour Participation Level: Active Behavioral Response: Casual, Neat and Well GroomedAlertEuthymic Type of Therapy: Individual Therapy Treatment Goals addressed: Communication: Open and honest communication and Coping   Purpose: MSW Intern Lauree Chandler met with client for routine individual therapy to work towards treatment goals: processing and expressing emotions, establishing and maintaining boundaries, and improving the relationships in her life.  Intervention: MSW Intern assessed for any significant events and assessed how the patient was doing today. The patient said she had been doing fairly well, but she needed to share about the events over the holidays with the intern. Intern provided active listening while the patient updated her on her life. Intern utilized intervention of CBT, Strength-based and Supportive to decrease anxiety/depressive symptoms. Intern assessed for SI/HI/command psychosis.  Effectiveness: Patient is alert x4 affect. Patient identified that she had been feeling great and was able to manage the relationships in her life with the skills she learned in therapy. Patient participated in session sharing how she practiced self-awareness, open communication, and upheld boundaries in her life to protect herself and her mental health. The patient stated how she felt much better with being able to utilize these tools in her daily life, but she was still working on how to keep progressing further. She explained how her partner had upset her, and she instinctively shut down. Together, the intern and patient brainstormed how the patient might be able to respond in a healthier way going forward. They discussed how the patient could still take a moment for herself before responding, but the intern stressed the importance of communicating her behaviors and emotions with her partner when this occurred.   Due to the new year, the intern  then encouraged the patient to make a list about what she wanted to leave behind in 2021 and what she wanted to bring with her in 2022. The patient was able to reflect on her progress since starting therapy and appreciated how far she had come. The patient responded well to the activity and seemed to set her up to continue her positive mindset in the new year. At the end of the session, the intern tasked the patient with continuing to reflect on her list and consider what else she wanted to focus on that would benefit her emotional and mental wellness.    Intervention was effective as patient was able to reflect. Progress towards goal is Ongoing. Patient denied active suicidal/homicidal/active psychosis.  Plan Patient offered next appointment for: 06/08/2020 at 9 am.  Diagnosis: Bereavement   MSW Intern, Roxana Hires 06/01/2020

## 2020-06-02 NOTE — Addendum Note (Signed)
Addended by: Theodoro Clock on: 06/02/2020 10:55 AM   Modules accepted: Level of Service

## 2020-06-08 ENCOUNTER — Ambulatory Visit: Payer: Self-pay | Admitting: Clinical

## 2020-06-08 ENCOUNTER — Other Ambulatory Visit: Payer: Self-pay

## 2020-06-08 DIAGNOSIS — Z634 Disappearance and death of family member: Secondary | ICD-10-CM

## 2020-06-08 NOTE — Progress Notes (Signed)
   THERAPY PROGRESS NOTE  Session Time: 9 am - 10 am; 60 mins Participation Level: Active Behavioral Response: Neat and Well GroomedAlertEuthymic Type of Therapy: Individual Therapy Treatment Goals addressed: Communication: Open and Honest and Coping   Purpose: MSW Intern Susannah met with client for routine individual therapy to work towards treatment goals: establishing and upholding boundaries, processing difficult emotions, communicating thoughts and feelings openly with others.  Intervention: Intern started the session by checking in with the client about had she had been doing and assessed for any significant events. The patient said she was doing well since the previous week and felt like she had woken up with a peaceful mindset. She emphasized how she felt very confident in her ability to use the skills she learned in their therapeutic sessions to express herself and improve the relationships in her life. During the session, the intern summarized and revisited some of those skills with the patient to review her overall progress. The intern utilized intervention of Solution Focused, Strength-based, and Supportive to decrease anxiety/depressive symptoms. Intern utilized a set of mindfulness cards that included exercises and prompts for introspection with the patient. Intern assessed for SI/HI/command psychosis.  Effectiveness: Patient is alert x4 affect. Patient identified she had been feeling better each week after being in therapy. Patient participated in session sharing how she had continued to practice communicating openly with her partner and how their relationship had continued to improve. She practiced these skills, as well as establishing boundaries, with her mother and felt more empowered in their relationship, as well. She reported feeling very capable of handling any conflict or unforeseen circumstances that might emerge in her life.   To reflect on her progress, the intern  incorporated mindfulness prompts from a series of cards. The intern encouraged the patient to pick a card and then respond aloud to it during the session. After they reflected this way, the intern invited the patient to set a new series of goals to extend her progress even further. Together, the patient and intern devised a list of goals including continuing the patient's self-reflection and mental work, investigating new hobbies and interests, prioritizing her own wellbeing, and more.  After the session, the patient requested to her next appointment further out due to feeling competent enough in her daily life to not need therapy sessions every week anymore. The intern praised the patient's journey and congratulated her on her growth. The intern emphasized that the patient could call her if she needed her, and they set an appointment in the following month to check back in with one another.  Intervention was effective as patient was able to reflect. Progress towards goal is Achieved. Upon their next session, the intern will check in and determine which new goals the patient would want to work on together moving forward. Patient denied active suicidal/homicidal/active psychosis.  Plan Patient offered next appointment for: 07/06/2020.  Diagnosis: Bereavement    Susannah N Burley 06/08/2020   

## 2020-07-06 ENCOUNTER — Other Ambulatory Visit: Payer: Self-pay | Admitting: Clinical

## 2020-07-10 ENCOUNTER — Other Ambulatory Visit: Payer: Self-pay | Admitting: Internal Medicine

## 2020-07-10 ENCOUNTER — Other Ambulatory Visit: Payer: Self-pay

## 2020-07-10 DIAGNOSIS — E782 Mixed hyperlipidemia: Secondary | ICD-10-CM

## 2020-07-10 DIAGNOSIS — Z79899 Other long term (current) drug therapy: Secondary | ICD-10-CM

## 2020-07-10 DIAGNOSIS — R739 Hyperglycemia, unspecified: Secondary | ICD-10-CM

## 2020-07-10 NOTE — Progress Notes (Signed)
Unable to obtain blood for labs. Wrote out requisition to have blood draw at [facility] drawing station

## 2020-07-12 LAB — HGB A1C W/O EAG: Hgb A1c MFr Bld: 5.3 % (ref 4.8–5.6)

## 2020-07-12 LAB — BASIC METABOLIC PANEL
BUN/Creatinine Ratio: 14 (ref 12–28)
BUN: 7 mg/dL — ABNORMAL LOW (ref 8–27)
CO2: 21 mmol/L (ref 20–29)
Calcium: 10.1 mg/dL (ref 8.7–10.3)
Chloride: 102 mmol/L (ref 96–106)
Creatinine, Ser: 0.51 mg/dL — ABNORMAL LOW (ref 0.57–1.00)
Glucose: 96 mg/dL (ref 65–99)
Potassium: 3.9 mmol/L (ref 3.5–5.2)
Sodium: 141 mmol/L (ref 134–144)
eGFR: 105 mL/min/{1.73_m2} (ref >59)

## 2020-07-12 LAB — LIPID PANEL W/O CHOL/HDL RATIO
Cholesterol, Total: 211 mg/dL — ABNORMAL HIGH (ref 100–199)
HDL: 71 mg/dL (ref >39)
LDL Chol Calc (NIH): 113 mg/dL — ABNORMAL HIGH (ref 0–99)
Triglycerides: 156 mg/dL — ABNORMAL HIGH (ref 0–149)
VLDL Cholesterol Cal: 27 mg/dL (ref 5–40)

## 2020-07-13 ENCOUNTER — Telehealth: Payer: Self-pay

## 2020-07-13 NOTE — Telephone Encounter (Signed)
Client called MSW intern Loletta Specter and asked whether she could still come in for a session without her new orange card. The intern advised the client to call [facility] and ask for the date when her new card was assigned.

## 2020-07-26 ENCOUNTER — Telehealth: Payer: Self-pay | Admitting: Clinical

## 2020-07-26 NOTE — Telephone Encounter (Signed)
MSW Intern Loletta Specter called the patient and rescheduled her appointment for 3/24 at 9 am. The patient confirmed that this time and date worked for her.

## 2020-08-03 ENCOUNTER — Other Ambulatory Visit: Payer: Self-pay

## 2020-08-03 ENCOUNTER — Ambulatory Visit: Payer: Self-pay | Admitting: Clinical

## 2020-08-03 DIAGNOSIS — Z634 Disappearance and death of family member: Secondary | ICD-10-CM

## 2020-08-03 NOTE — Progress Notes (Signed)
   THERAPY PROGRESS NOTE  Session Time: 9 am - 10 am; 60 minutes Participation Level: Active Behavioral Response: Casual and Well GroomedAlertEuthymic Type of Therapy: Individual Therapy Treatment Goals addressed: Communication: open communication, expressing emotions, establishing boundaries    Purpose: MSW intern Lauree Chandler met with the client for routine individual therapy to work towards treatment goals: improving honest communication, expressing her emotions, and establishing/upholding boundaries.   Intervention: Intern began with a check-in to assess for any significant events and assess how the client is doing today. Intern provided reflective and active listening as the client provided updates about how her life had been going since it had been some time since the intern had met with her. The client shared about the changes in her familial and personal relationships and how she had been managing them with the skills she previously learned in their counseling sessions together. As the client shared, the intern utilized the intervention of CBT, Solution Focused and Strength-based to decrease anxiety/depressive symptoms. The intern assisted the client by providing feedback on the progress she shared and affirmed how much growth the client shows since their initial sessions. The intern also challenged the client to brainstorm about how she could maintain her progress and continue to strive towards improving the relationships in her life while still prioritizing her own mental and emotional wellbeing. Intern assessed for SI/HI/command psychosis.  Effectiveness: Patient is alert x4 affect. Patient identified she continues doing well and could notice the positive differences she sees in her life now as a result of treatment. Patient participated in session by sharing how she had recently upheld her boundaries in a familial issue. She had been invited to an event where a family member would be attending  that she preferred not to interact with, so she chose not to go as a result. When questioned by the intern whether this felt like the right decision, the patient stood by her actions and said she was proud of how she put herself first in the situation. The intern celebrated the client's growth with her and discussed how she could continue to improve her life in similar ways, such as protecting her boundaries in other family relationships or showing her partner how appreciative she is of the work he has recently invested in their relationship. Intervention was effective as patient was able to reflect. Progress towards goal is Ongoing. Patient denied active suicidal/homicidal/active psychosis.  Plan Patient offered next appointment for: 08/31/2020  Diagnosis: Bereavement   MSW Intern, Roxana Hires 08/03/2020

## 2020-08-11 ENCOUNTER — Other Ambulatory Visit: Payer: Self-pay

## 2020-08-11 ENCOUNTER — Ambulatory Visit: Payer: Self-pay | Admitting: Internal Medicine

## 2020-08-11 ENCOUNTER — Encounter: Payer: Self-pay | Admitting: Internal Medicine

## 2020-08-11 VITALS — BP 123/73 | HR 92 | Resp 12 | Ht 63.25 in | Wt 148.0 lb

## 2020-08-11 DIAGNOSIS — Z780 Asymptomatic menopausal state: Secondary | ICD-10-CM

## 2020-08-11 DIAGNOSIS — R739 Hyperglycemia, unspecified: Secondary | ICD-10-CM

## 2020-08-11 DIAGNOSIS — H538 Other visual disturbances: Secondary | ICD-10-CM

## 2020-08-11 DIAGNOSIS — L930 Discoid lupus erythematosus: Secondary | ICD-10-CM

## 2020-08-11 DIAGNOSIS — E782 Mixed hyperlipidemia: Secondary | ICD-10-CM

## 2020-08-11 DIAGNOSIS — I10 Essential (primary) hypertension: Secondary | ICD-10-CM

## 2020-08-11 MED ORDER — CALCIUM CITRATE-VITAMIN D 500-500 MG-UNIT PO CHEW: CHEWABLE_TABLET | ORAL | Status: DC

## 2020-08-11 NOTE — Progress Notes (Signed)
    Subjective:    Patient ID: Jacqueline Fox, female   DOB: [birth year], 65 y.o.   MRN: [id number]   HPI   1.  Hyperlipidemia:  Discussed total and LDL improved.  HDL remains high.  Eats a healthy diet.  Physically active with walking daily.  Lipid Panel     Component Value Date/Time   CHOL 211 (H) 07/11/2020 0916   TRIG 156 (H) 07/11/2020 0916   HDL 71 07/11/2020 0916   LDLCALC 113 (H) 07/11/2020 0916   LABVLDL 27 07/11/2020 0916     2.  Elevated blood glucose:  Her A1C was recently 5.3%, discussed well within normal range.    3.  Blurry vision in morning.  Up at 4 a.m. and no meal until 6:30 am.  Does not eat after 7 p.m. in evening.  4.  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  Not with much intake of dairy products.  No calcium or adequate Vitamin D supplementation.  She does not perform physical activity during winter months  5.  Hypertension:  No problems:  Controlled  6.  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  Current Meds  Medication Sig  . amLODipine (NORVASC) 10 MG tablet Take 1 tablet (10 mg total) by mouth daily.  . Fluocinolone Acetonide Scalp (DERMA-SMOOTHE/FS SCALP) 0.01 % OIL Apply to scalp at bedtime as per instructions  . Multiple Vitamin (MULTIVITAMIN) tablet Take 1 tablet by mouth daily.  Marland Kitchen triamcinolone (KENALOG) 0.025 % ointment Apply scant amount to affected areas once daily as needed.   No Known Allergies   Review of Systems    Objective:   BP 123/73 (BP Location: Left Arm, Patient Position: Sitting, Cuff Size: Normal)   Pulse 92   Resp 12   Ht 5' 3.25" (1.607 m)   Wt 148 lb (67.1 kg)   LMP 02/27/2011   BMI 26.01 kg/m   Physical Exam  NAD Skin:  Hypopigmentation/pink discoloration of patches of skin at hairline, ears, less involved at neck level.   HEENT:  PERRL, EOMI, Discs sharp.  TMs pearly gray Neck:  Supple, No adenopathy, no thyromegaly Chest:  CTA CV:  RRR with  normal S1 and S2, No S3, S4 or murmur.  No carotid bruits.  Carotid, radial and DP pulses normal and equal LE:  No edema.   Assessment & Plan  1.  Mild mixed hyperlipidemia with quite good HDL.  Continue to work on diet and physical activity to get trigs and LDL down a bit  2.  Hyperglycemia:  A1C shows normal blood sugars over time.  3.  Hypertension:  Well controlled.  4.  HM:  Wants to think about second Moderna booster.  Did not want to have another blood draw today--prefers waiting for CPE in October to check Vitamin D level in a postmenopausal woman.  In meantime, will supplement with Citrated Calcium 500 mg and 400 IU Vitamin D twice daily.  Continue regular weight bearing physical activity.   Will go ahead and order DEXA bone density as well with her increased risk for osteoporosis. Marland Kitchen 5.  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 Amy Swaziland, Dermatology.  6.  Blurry vision:  Seems to coincide with lack of recent oral intake.  Encouraged eating breakfast earlier or adding more protein/good fat to evening meal.  May be dropping blood sugar.  To call if does not improve with dietary changes.

## 2020-08-14 NOTE — Addendum Note (Signed)
Addended by: Theodoro Clock on: 08/14/2020 04:01 PM   Modules accepted: Level of Service

## 2020-08-24 ENCOUNTER — Telehealth: Payer: Self-pay | Admitting: Internal Medicine

## 2020-08-24 NOTE — Telephone Encounter (Signed)
Patient called requesting a refill for Fluocinolone Acetonide Scalp (DERMA-SMOOTHE/FS SCALP) 0.01 % OIL.

## 2020-08-25 MED ORDER — FLUOCINOLONE ACETONIDE SCALP 0.01 % EX OIL: TOPICAL_OIL | CUTANEOUS | 4 refills | Status: DC

## 2020-08-25 NOTE — Telephone Encounter (Signed)
sent 

## 2020-08-25 NOTE — Addendum Note (Signed)
Addended by: Marcene Duos on: 08/25/2020 10:40 AM   Modules accepted: Orders

## 2020-08-29 NOTE — Telephone Encounter (Signed)
Spoke with patient and she confirmed that was able to pick up her Rx.  

## 2020-08-31 ENCOUNTER — Other Ambulatory Visit: Payer: Self-pay

## 2020-08-31 ENCOUNTER — Ambulatory Visit: Payer: Self-pay | Admitting: Clinical

## 2020-08-31 DIAGNOSIS — Z634 Disappearance and death of family member: Secondary | ICD-10-CM

## 2020-09-06 NOTE — Progress Notes (Signed)
   THERAPY PROGRESS NOTE  Session Time: 9 am - 10 am Participation Level: Active Behavioral Response: Casual and Well GroomedAlertEuthymic Type of Therapy: Individual Therapy Treatment Goals addressed: Anxiety and Coping   Purpose: MSW intern Lauree Chandler met with client for routine individual therapy to work towards treatment goals: expressing her emotions, communicating openly and honestly with family members, and establishing and preserving boundaries.  Intervention: MSW intern started by assessing for any significant events and assessed how the client was doing on this date. As the client shared about her recent updates, intern practiced active and reflexive listening skills. The client shared that things had been going very well with her family and with her partner that she lived with. She had no new complaints or issues to mention, but she did name one hurdle she overcame by using the boundary setting skills she learned in past sessions. As she listened to the updates, intern utilized intervention of CBT, Solution Focused, Strength-based and Supportive approaches by affirming the client's experiences and praising all of the progress that she had made. Intern challenged the client to continue her growth by using CBT and having her review her behavioral and communicative patterns aloud. As a result of the interventions, intern was able to decrease the minor anxiety/depressive symptoms exhibited by the client. Intern utilized handout of a 'goodbye letter' since this was their last session together. This activity helped promote closure by having the intern and client review their work throughout the year and reflecting on what each of them had learned. Intern assessed for SI/HI/command psychosis.  Effectiveness: Client is alert x4 affect. Client identified she is feeling very positive and empowered. Client participated in session sharing that she had felt great since they had last seen each other. She  had not encountered any new issues in her life, and she had even had very pleasant interactions with her family members. She shared how proud of herself she was that she could notice to positive changes in her mood and behavior. She now felt confident in her ability to handle difficult situations and knowing when and how to put herself first. Client provided a specific example of how one of her family members asked her to do something for them, and due to her current responsibilities, she felt overwhelmed about this. She identified that she felt guilty, located the feeling, and then processed it before responding. In the end, she was able to successfully say no and uphold her social and emotional boundaries.   When working on the goodbye letters, the client shared how much she felt like she had learned from her sessions with the intern. She reflected on how far she had come since their first meeting, and she said, "I didn't used to even know who I was. But, now I've found [place], and I love her." The intern thanked the client for being willing to work with her and how she routinely showed up for herself each week. Intervention was effective as patient was able to reflect. Progress towards goal is Achieved. Patient denied active suicidal/homicidal/active psychosis.  The intern explained that LCSW Dwan Bolt would be available in the future if the client wanted to return to therapy, and the client thanked her for letting her know. The client said she would be in touch if she needed anything.  Plan Patient offered next appointment for: n/a, not needed at this time. Patient's choice.  Diagnosis: Bereavement    Jacqueline Fox 09/06/2020

## 2020-09-06 NOTE — Addendum Note (Signed)
Addended by: Theodoro Clock on: 09/06/2020 06:14 PM   Modules accepted: Level of Service

## 2020-10-02 ENCOUNTER — Other Ambulatory Visit: Payer: Self-pay | Admitting: Internal Medicine

## 2021-02-12 ENCOUNTER — Other Ambulatory Visit: Payer: Self-pay | Admitting: Internal Medicine

## 2021-02-13 ENCOUNTER — Other Ambulatory Visit: Payer: Self-pay

## 2021-02-13 DIAGNOSIS — L930 Discoid lupus erythematosus: Secondary | ICD-10-CM

## 2021-02-13 DIAGNOSIS — Z79899 Other long term (current) drug therapy: Secondary | ICD-10-CM

## 2021-02-13 DIAGNOSIS — E782 Mixed hyperlipidemia: Secondary | ICD-10-CM

## 2021-02-14 LAB — COMPREHENSIVE METABOLIC PANEL
ALT: 19 IU/L (ref 0–32)
AST: 20 IU/L (ref 0–40)
Albumin/Globulin Ratio: 1.4 (ref 1.2–2.2)
Albumin: 4.7 g/dL (ref 3.8–4.8)
Alkaline Phosphatase: 64 IU/L (ref 44–121)
BUN/Creatinine Ratio: 14 (ref 12–28)
BUN: 7 mg/dL — ABNORMAL LOW (ref 8–27)
Bilirubin Total: 0.4 mg/dL (ref 0.0–1.2)
CO2: 24 mmol/L (ref 20–29)
Calcium: 9.8 mg/dL (ref 8.7–10.3)
Chloride: 102 mmol/L (ref 96–106)
Creatinine, Ser: 0.49 mg/dL — ABNORMAL LOW (ref 0.57–1.00)
Globulin, Total: 3.4 g/dL (ref 1.5–4.5)
Glucose: 102 mg/dL — ABNORMAL HIGH (ref 70–99)
Potassium: 3.8 mmol/L (ref 3.5–5.2)
Sodium: 142 mmol/L (ref 134–144)
Total Protein: 8.1 g/dL (ref 6.0–8.5)
eGFR: 106 mL/min/{1.73_m2} (ref >59)

## 2021-02-14 LAB — CBC WITH DIFFERENTIAL/PLATELET
Basophils Absolute: 0 10*3/uL (ref 0.0–0.2)
Basos: 1 %
EOS (ABSOLUTE): 0 10*3/uL (ref 0.0–0.4)
Eos: 1 %
Hematocrit: 38.7 % (ref 34.0–46.6)
Hemoglobin: 13.1 g/dL (ref 11.1–15.9)
Immature Grans (Abs): 0 10*3/uL (ref 0.0–0.1)
Immature Granulocytes: 0 %
Lymphocytes Absolute: 1.8 10*3/uL (ref 0.7–3.1)
Lymphs: 34 %
MCH: 32 pg (ref 26.6–33.0)
MCHC: 33.9 g/dL (ref 31.5–35.7)
MCV: 94 fL (ref 79–97)
Monocytes Absolute: 0.4 10*3/uL (ref 0.1–0.9)
Monocytes: 7 %
Neutrophils Absolute: 3 10*3/uL (ref 1.4–7.0)
Neutrophils: 57 %
Platelets: 287 10*3/uL (ref 150–450)
RBC: 4.1 x10E6/uL (ref 3.77–5.28)
RDW: 13.6 % (ref 11.7–15.4)
WBC: 5.2 10*3/uL (ref 3.4–10.8)

## 2021-02-14 LAB — LIPID PANEL W/O CHOL/HDL RATIO
Cholesterol, Total: 225 mg/dL — ABNORMAL HIGH (ref 100–199)
HDL: 68 mg/dL (ref >39)
LDL Chol Calc (NIH): 129 mg/dL — ABNORMAL HIGH (ref 0–99)
Triglycerides: 162 mg/dL — ABNORMAL HIGH (ref 0–149)
VLDL Cholesterol Cal: 28 mg/dL (ref 5–40)

## 2021-02-14 LAB — VITAMIN D 25 HYDROXY (VIT D DEFICIENCY, FRACTURES): Vit D, 25-Hydroxy: 28.3 ng/mL — ABNORMAL LOW (ref 30.0–100.0)

## 2021-02-15 ENCOUNTER — Other Ambulatory Visit: Payer: Self-pay | Admitting: Internal Medicine

## 2021-02-15 ENCOUNTER — Encounter: Payer: Self-pay | Admitting: Internal Medicine

## 2021-02-15 ENCOUNTER — Other Ambulatory Visit: Payer: Self-pay

## 2021-02-15 ENCOUNTER — Ambulatory Visit: Payer: Self-pay | Admitting: Internal Medicine

## 2021-02-15 VITALS — BP 116/72 | HR 84 | Resp 16 | Ht 63.75 in | Wt 146.0 lb

## 2021-02-15 DIAGNOSIS — Z124 Encounter for screening for malignant neoplasm of cervix: Secondary | ICD-10-CM

## 2021-02-15 DIAGNOSIS — Z1231 Encounter for screening mammogram for malignant neoplasm of breast: Secondary | ICD-10-CM

## 2021-02-15 DIAGNOSIS — Z23 Encounter for immunization: Secondary | ICD-10-CM

## 2021-02-15 DIAGNOSIS — L930 Discoid lupus erythematosus: Secondary | ICD-10-CM

## 2021-02-15 DIAGNOSIS — Z0000 Encounter for general adult medical examination without abnormal findings: Secondary | ICD-10-CM

## 2021-02-15 NOTE — Progress Notes (Signed)
Subjective:    Patient ID: Jacqueline Fox, female   DOB: [birth year], 65 y.o.   MRN: [id number]   HPI  CPE with pap  1.  Pap:  Last 02/11/2018 and normal.  2.  Mammogram:  Last in 11/20 with extra views of right breast with ultrasound showing persistent benign cyst in 12/20.  3.  Osteoprevention:  Drinking 1 cup milk daily.  Not taking the calcium and Vitamin D supplement.  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    4.  Guaiac Cards/FIT:  Negative for blood 02/2020  5.  Colonoscopy:  02/12/2018:  normal colonoscopy with Dr Matthias Hughs save for one diverticulum.  No polyps.  6.  Immunizations:  Needs COVID Risk manager. Immunization History  Administered Date(s) Administered   Influenza,inj,Quad PF,6+ Mos 02/18/2018, 02/17/2019   Influenza-Unspecified 02/09/2020, 02/12/2021   Moderna Sars-Covid-2 Vaccination 07/12/2019, 08/09/2019, 03/13/2020   Tdap 08/20/2017     7.  Glucose/Cholesterol:  Recent glucose mildly high at 102, but earlier in year similar finding with A1C normal at 5.3%.  Cholesterol slightly high, trigs slightly high.  Very high HDL.   Lipid Panel     Component Value Date/Time   CHOL 225 (H) 02/13/2021 0915   TRIG 162 (H) 02/13/2021 0915   HDL 68 02/13/2021 0915   LDLCALC 129 (H) 02/13/2021 0915   LABVLDL 28 02/13/2021 0915     Current Meds  Medication Sig   amLODipine (NORVASC) 10 MG tablet Take 1 tablet by mouth daily   calcium citrate-vitamin D 500-500 MG-UNIT chewable tablet 1 tab by mouth twice daily   Fluocinolone Acetonide Scalp (DERMA-SMOOTHE/FS SCALP) 0.01 % OIL Apply to scalp at bedtime as per instructions   Multiple Vitamin (MULTIVITAMIN) tablet Take 1 tablet by mouth daily.   triamcinolone (KENALOG) 0.025 % ointment Apply scant amount to affected areas once daily as needed.   No Known Allergies   Review of Systems  HENT:  Negative for dental problem (She keeps meaning to call dental clinic to get an appt--she will take care of that  appt.).   Eyes:  Negative for visual disturbance.  Respiratory:  Negative for shortness of breath.   Cardiovascular:  Negative for chest pain, palpitations and leg swelling.     Objective:   BP 116/72 (BP Location: Left Arm, Patient Position: Sitting, Cuff Size: Normal)   Pulse 84   Resp 16   Ht 5' 3.75" (1.619 m)   Wt 146 lb (66.2 kg)   LMP 02/27/2011   BMI 25.26 kg/m   Physical Exam Constitutional:      Appearance: Normal appearance.  HENT:     Head: Normocephalic and atraumatic.     Right Ear: Tympanic membrane, ear canal and external ear normal.     Left Ear: Tympanic membrane, ear canal and external ear normal.     Nose: Nose normal.     Mouth/Throat:     Mouth: Mucous membranes are moist.     Pharynx: Oropharynx is clear.     Comments: Dental caries Eyes:     Extraocular Movements: Extraocular movements intact.     Conjunctiva/sclera: Conjunctivae normal.     Pupils: Pupils are equal, round, and reactive to light.     Comments: Discs sharp  Neck:     Thyroid: No thyroid mass or thyromegaly.  Cardiovascular:     Rate and Rhythm: Normal rate and regular rhythm.     Heart sounds: S1 normal and S2 normal. No murmur heard.  No friction rub. No S3 or S4 sounds.     Comments: No carotid bruits.  Carotid, radial, femoral, DP and PT pulses normal and equal.    Pulmonary:     Effort: Pulmonary effort is normal.     Breath sounds: Normal breath sounds.  Chest:  Breasts:    Right: No inverted nipple, mass or nipple discharge.     Left: No inverted nipple, mass or nipple discharge.  Abdominal:     General: Bowel sounds are normal.     Palpations: Abdomen is soft. There is no hepatomegaly, splenomegaly or mass.     Tenderness: There is no abdominal tenderness.     Hernia: No hernia is present.  Genitourinary:    Comments: Normal external female genitalia No vaginal or cervical mucosa lesion or inflammation. No uterine or adnexal mass or  tenderness. Musculoskeletal:        General: Normal range of motion.     Cervical back: Normal range of motion and neck supple.     Right lower leg: No edema.     Left lower leg: No edema.  Lymphadenopathy:     Head:     Right side of head: No submental or submandibular adenopathy.     Left side of head: No submental or submandibular adenopathy.     Cervical: No cervical adenopathy.     Upper Body:     Right upper body: No supraclavicular or axillary adenopathy.     Left upper body: No supraclavicular or axillary adenopathy.     Lower Body: No right inguinal adenopathy. No left inguinal adenopathy.  Skin:    General: Skin is warm.     Capillary Refill: Capillary refill takes less than 2 seconds.     Findings: No rash.     Comments: Scattered areas of loss of pigmentation:  hairline, ears.  No erythematous lesions.  Neurological:     General: No focal deficit present.     Mental Status: She is alert and oriented to person, place, and time.     Cranial Nerves: Cranial nerves 2-12 are intact.     Sensory: Sensation is intact.     Motor: Motor function is intact.     Coordination: Coordination is intact.     Gait: Gait is intact.     Deep Tendon Reflexes: Reflexes are normal and symmetric.  Psychiatric:        Behavior: Behavior normal. Behavior is cooperative.      Assessment & Plan    CPE with pap Schedule mammogram To make sure she is taking calcium and Vit D supplement.  Fasting labs done. Moderna bivalent vaccine given  2.  Hyperlipidemia with good HDL.  Continue to work on diet and physical activity--discussed  3.  Mild fasting hyperglycemia with good A1C:  as in #2.    4.  Hypertension:  controlled  5.  Discoid Lupus:  controlled

## 2021-02-16 LAB — CYTOLOGY - PAP

## 2021-02-19 ENCOUNTER — Other Ambulatory Visit: Payer: Self-pay | Admitting: Obstetrics and Gynecology

## 2021-02-19 DIAGNOSIS — Z1231 Encounter for screening mammogram for malignant neoplasm of breast: Secondary | ICD-10-CM

## 2021-02-28 ENCOUNTER — Other Ambulatory Visit: Payer: Self-pay

## 2021-02-28 DIAGNOSIS — Z1211 Encounter for screening for malignant neoplasm of colon: Secondary | ICD-10-CM

## 2021-02-28 LAB — POC FIT TEST STOOL: Fecal Occult Blood: NEGATIVE

## 2021-03-29 ENCOUNTER — Ambulatory Visit: Payer: Self-pay | Admitting: *Deleted

## 2021-03-29 ENCOUNTER — Ambulatory Visit
Admission: RE | Admit: 2021-03-29 | Discharge: 2021-03-29 | Disposition: A | Discharge status: Home / Self Care | Payer: No Typology Code available for payment source | Source: Ambulatory Visit | Attending: Obstetrics and Gynecology | Admitting: Obstetrics and Gynecology

## 2021-03-29 ENCOUNTER — Other Ambulatory Visit: Payer: Self-pay

## 2021-03-29 VITALS — BP 126/74 | Wt 146.9 lb

## 2021-03-29 DIAGNOSIS — Z1231 Encounter for screening mammogram for malignant neoplasm of breast: Secondary | ICD-10-CM

## 2021-03-29 DIAGNOSIS — Z1239 Encounter for other screening for malignant neoplasm of breast: Secondary | ICD-10-CM

## 2021-03-29 IMAGING — MG MM DIGITAL SCREENING BILAT W/ TOMO AND CAD
6 of 10 series · 6 of 30 positions shown · non-contrast
Comparison: Previous exam(s).

CLINICAL DATA: Screening.

EXAM:
DIGITAL SCREENING BILATERAL MAMMOGRAM WITH TOMOSYNTHESIS AND CAD
TECHNIQUE: Bilateral screening digital craniocaudal and mediolateral oblique
mammograms were obtained. Bilateral screening digital breast
tomosynthesis was performed. The images were evaluated with
computer-aided detection.

[R CC synth-2D]
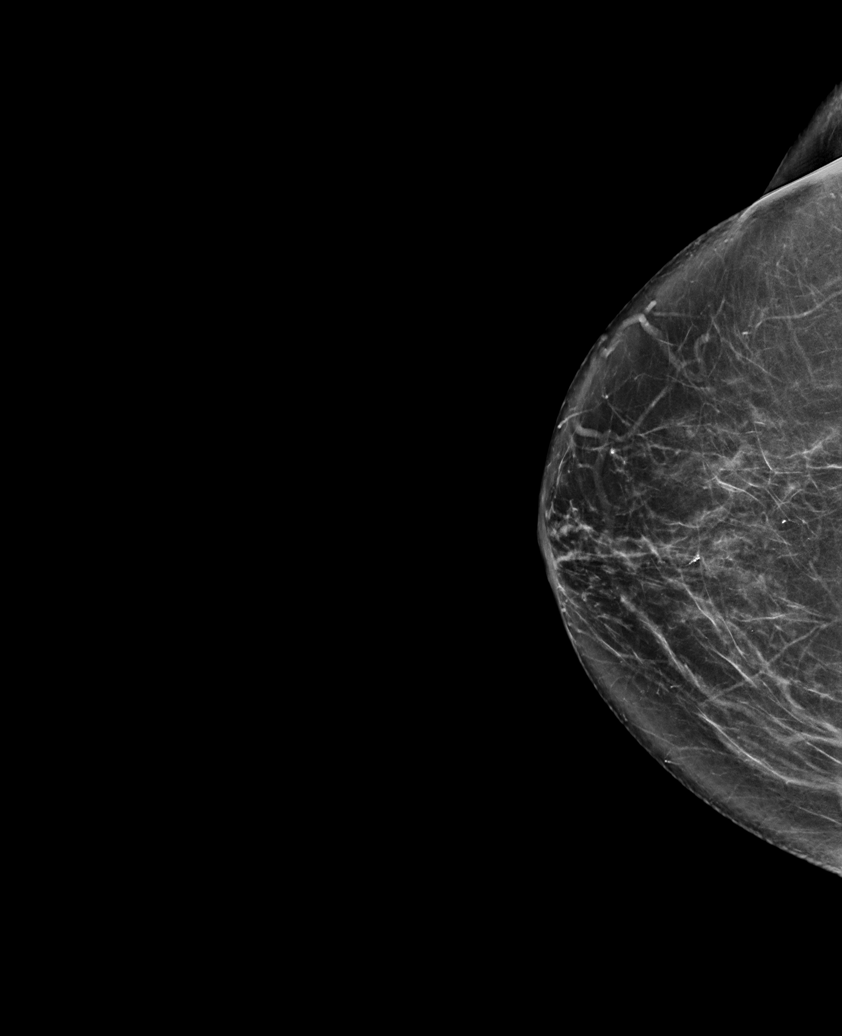

[R MLO synth-2D (1 of 2)]
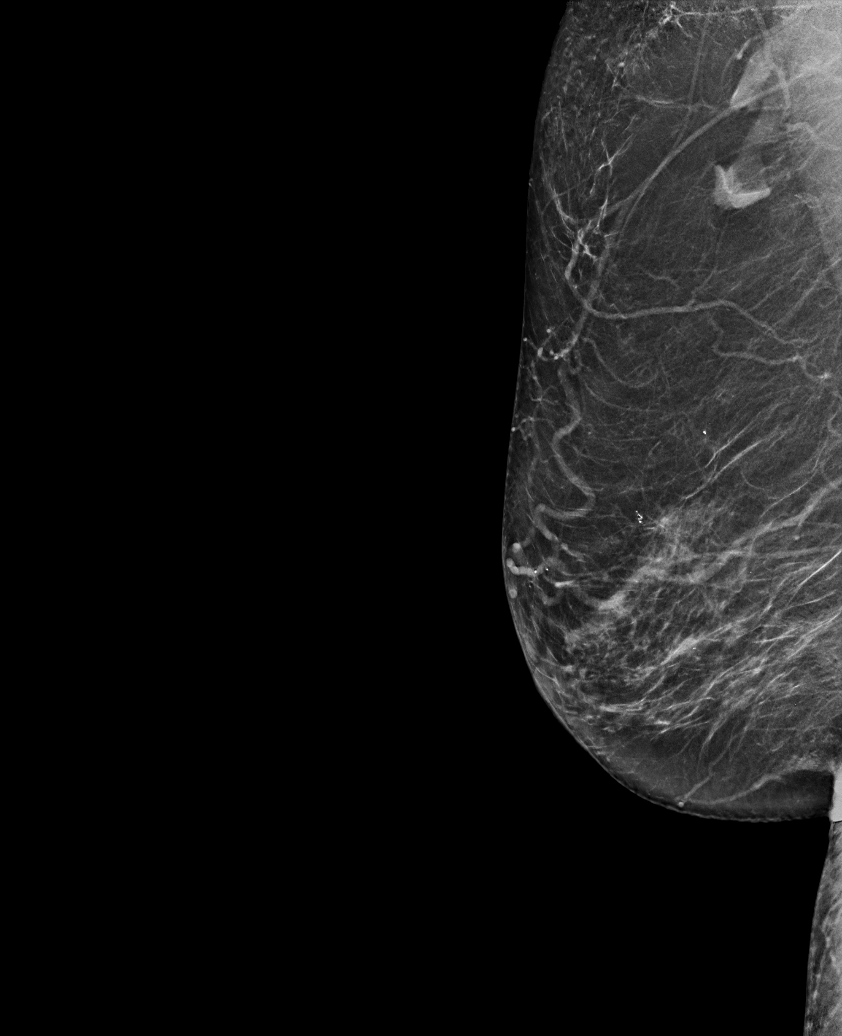

[L CC synth-2D]
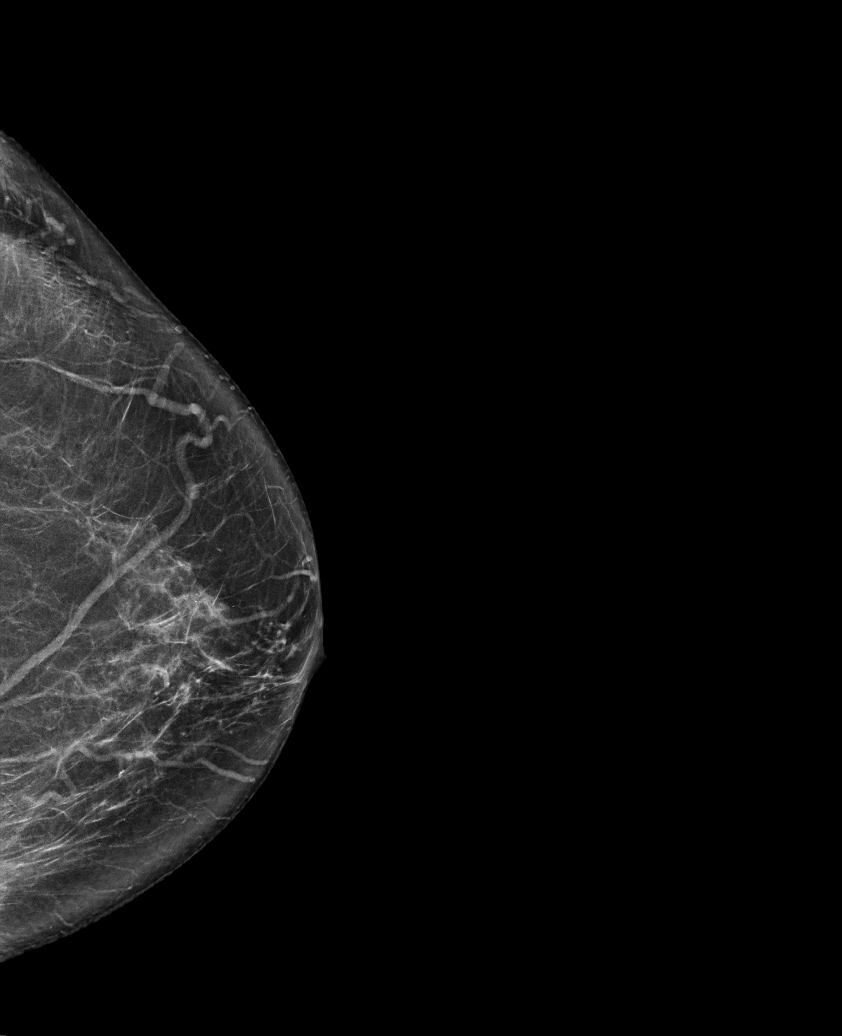

[L MLO synth-2D]
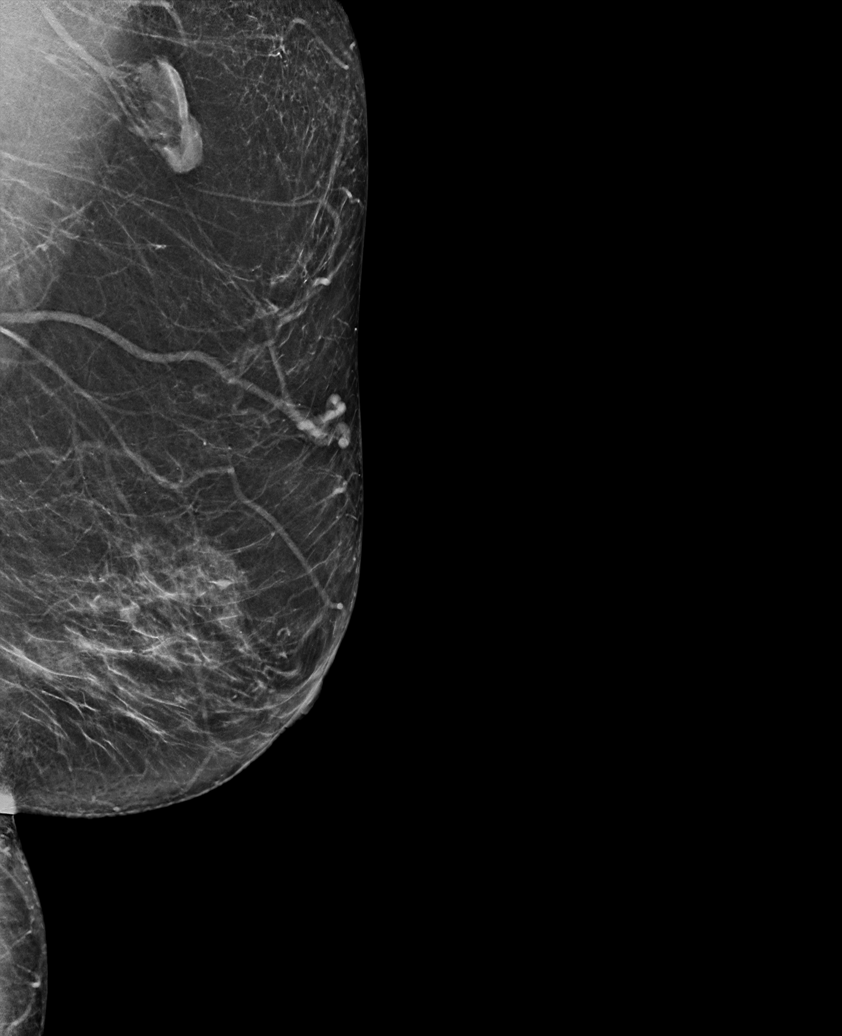

[R MLO synth-2D (2 of 2)]
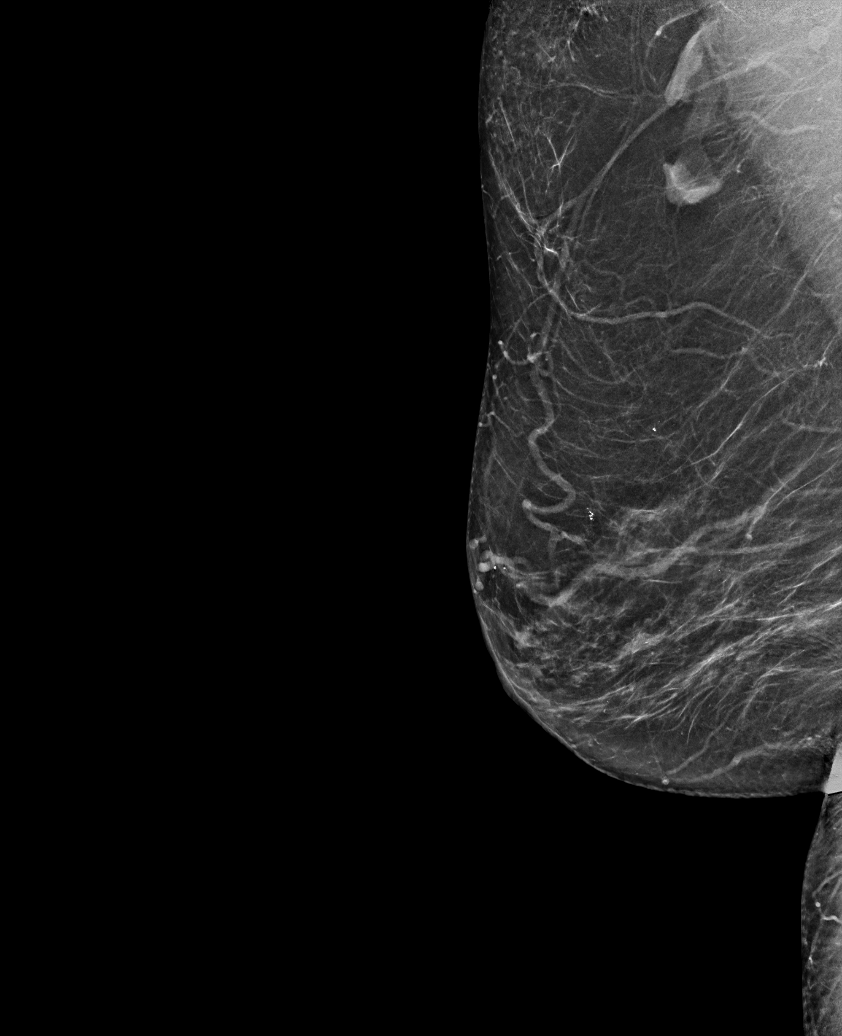

[L CC tomo · tomo slice 35/69.0]
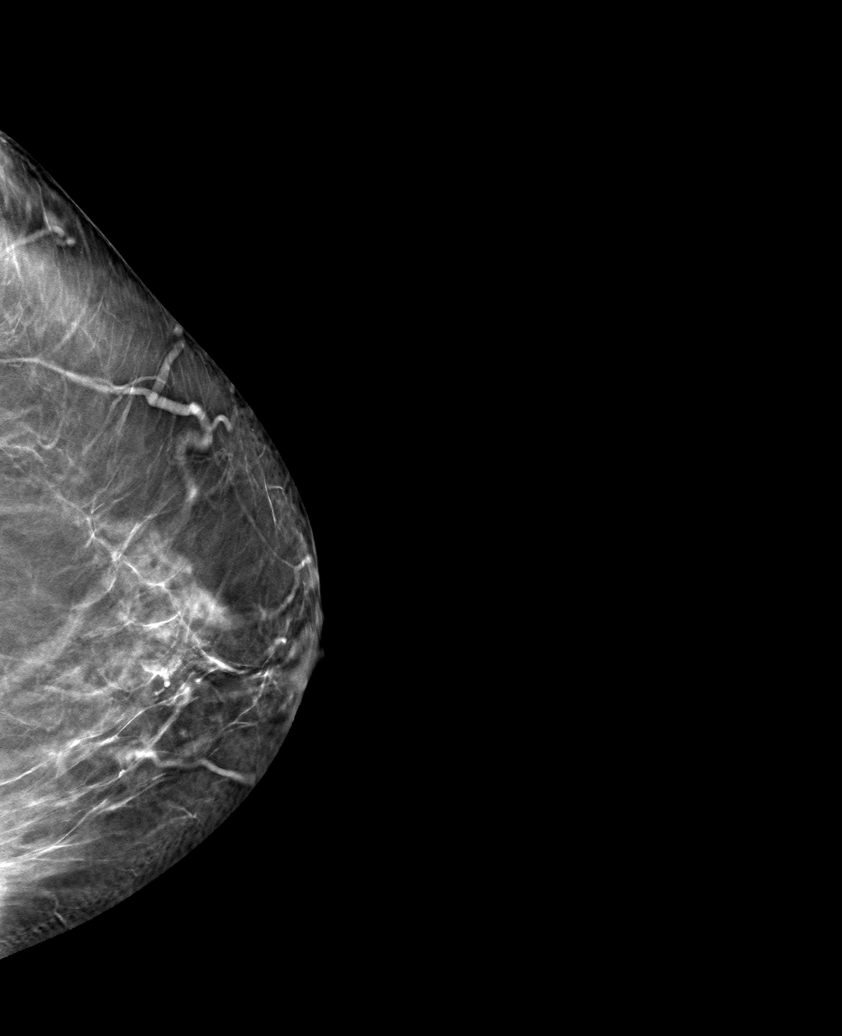

[6 of 30 positions shown; findings below may reference images not displayed]

ACR Breast Density Category b: There are scattered areas of
fibroglandular density.
FINDINGS: There are no findings suspicious for malignancy.
IMPRESSION: No mammographic evidence of malignancy. A result letter of this
screening mammogram will be mailed directly to the patient.

RECOMMENDATION:
Screening mammogram in one year. (Code:[BY])

BI-RADS CATEGORY  1: Negative.

## 2021-03-29 NOTE — Patient Instructions (Addendum)
Explained breast self awareness with Jacqueline Fox. Patient did not need a Pap smear today due to last Pap smear was 02/15/2021. Let her know [facility]CP will cover Pap smears every 3 years unless has a history of abnormal Pap smears. Referred patient to the [facility] of [facility] for a screening mammogram on mobile unit. Appointment scheduled Thursday, March 29, 2021 at 1120. Patient escorted to the mobile unit following B[facility]CP appointment for her screening mammogram. Let patient know the Breast Center will follow up with her within the next couple weeks with results of her mammogram by letter or phone. Discussed smoking cessation with patient. Referred to the [facility] Quitline and gave resources to the free smoking cessation classes at [facility]. Jacqueline Fox verbalized understanding.  Drystan Reader, Kathaleen Maser, RN 10:51 AM

## 2021-03-29 NOTE — Progress Notes (Signed)
Ms. DELECIA VASTINE is a 65 y.o. female who presents to [facility] clinic today with no complaints.    Pap Smear: Pap smear not completed today. Last Pap smear was 02/15/2021 at [facility] and normal. Per patient has a history of an abnormal Pap smear 32 years ago that a colposcopy was completed for follow-up. Patient stated all Pap smears have been normal since colposcopy and has had at least three normal Pap smears. Last Pap smear result is in Epic.   Physical exam: Breasts Breasts symmetrical. No skin abnormalities bilateral breasts. No nipple retraction bilateral breasts. No nipple discharge bilateral breasts. No lymphadenopathy. No lumps palpated bilateral breasts. No complaints of pain or tenderness on exam.  MS DIGITAL SCREENING TOMO BILATERAL  Result Date: 04/09/2019 CLINICAL DATA:  Screening. EXAM: DIGITAL SCREENING BILATERAL MAMMOGRAM WITH TOMO AND CAD COMPARISON:  Previous exam(s). ACR Breast Density Category b: There are scattered areas of fibroglandular density. FINDINGS: In the right breast, a possible mass warrants further evaluation. In the left breast, no findings suspicious for malignancy. Images were processed with CAD. IMPRESSION: Further evaluation is suggested for possible mass in the right breast. RECOMMENDATION: Diagnostic mammogram and possibly ultrasound of the right breast. (Code:FI-R-69M) The patient will be contacted regarding the findings, and additional imaging will be scheduled. BI-RADS CATEGORY  0: Incomplete. Need additional imaging evaluation and/or prior mammograms for comparison. Electronically Signed   By: Emmaline Kluver M.D.   On: 04/09/2019 14:54   MS DIGITAL SCREENING TOMO BILATERAL  Result Date: 09/17/2017 CLINICAL DATA:  Screening. EXAM: DIGITAL SCREENING BILATERAL MAMMOGRAM WITH TOMO AND CAD COMPARISON:  Previous exam(s). ACR Breast Density Category b: There are scattered areas of fibroglandular density. FINDINGS: There are no findings suspicious for  malignancy. Images were processed with CAD. IMPRESSION: No mammographic evidence of malignancy. A result letter of this screening mammogram will be mailed directly to the patient. RECOMMENDATION: Screening mammogram in one year. (Code:SM-B-01Y) BI-RADS CATEGORY  1: Negative. Electronically Signed   By: Sherian Rein M.D.   On: 09/17/2017 08:38   MS DIGITAL DIAG TOMO UNI RIGHT  Result Date: 04/29/2019 CLINICAL DATA:  Screening recall for a possible right breast mass. EXAM: DIGITAL DIAGNOSTIC RIGHT MAMMOGRAM WITH CAD AND TOMO ULTRASOUND RIGHT BREAST COMPARISON:  Previous exam(s). ACR Breast Density Category b: There are scattered areas of fibroglandular density. FINDINGS: Spot compression tomosynthesis images reveal a persistent oval obscured mass in the anterior retroareolar right breast, seen only on the MLO view. Mammographic images were processed with CAD. Ultrasound of the right breast at 10:30, 1 cm from the nipple demonstrates an anechoic oval circumscribed mass measuring 8 x 3 x 7 mm. IMPRESSION: The right breast mass at 10:30 corresponds with a benign cyst. RECOMMENDATION: Screening mammogram in one year.(Code:SM-B-01Y) I have discussed the findings and recommendations with the patient. If applicable, a reminder letter will be sent to the patient regarding the next appointment. BI-RADS CATEGORY  2: Benign. Electronically Signed   By: Frederico Hamman M.D.   On: 04/29/2019 15:13         Pelvic/Bimanual Pap is not indicated today per BCCCP guidelines.    Smoking History: Patient is a current smoker. Discussed smoking cessation with patient. Referred to the [facility] Quitline and gave resources to the free smoking cessation classes at [facility].   Patient Navigation: Patient education provided. Access to services provided for patient through [facility] program.   Colorectal Cancer Screening: Patient has had colonoscopy completed on 02/12/2018 at [facility].  FIT Test completed 02/28/2021 that was  negative. No complaints today.    Breast and Cervical Cancer Risk Assessment: Patient has a family history of a paternal aunt having breast cancer. patient has no known genetic mutations or history of radiation treatment to the chest before age 20. Per patient has a history of cervical dysplasia. Patient has no history of being immunocompromised or DES exposure in-utero.  Risk Assessment     Risk Scores       03/29/2021 04/29/2019   Last edited by: Narda Rutherford, LPN Hrishikesh Hoeg, Carlye Grippe, RN   5-year risk: 1.5 % 1.5 %   Lifetime risk: 6.5 % 6.7 %            A: BCCCP exam without pap smear No complaints.  P: Referred patient to the [facility] for a screening mammogram on mobile unit. Appointment scheduled Thursday, March 29, 2021 at 1120.  Priscille Heidelberg, RN 03/29/2021 10:51 AM

## 2021-04-03 ENCOUNTER — Other Ambulatory Visit: Payer: Self-pay

## 2021-04-03 ENCOUNTER — Inpatient Hospital Stay (HOSPITAL_COMMUNITY)
Admission: EM | Admit: 2021-04-03 | Discharge: 2021-04-08 | DRG: 123 | Disposition: A | Discharge status: Home / Self Care | Payer: Self-pay | Attending: Internal Medicine | Admitting: Internal Medicine

## 2021-04-03 ENCOUNTER — Encounter (HOSPITAL_COMMUNITY): Payer: Self-pay

## 2021-04-03 ENCOUNTER — Emergency Department (HOSPITAL_COMMUNITY): Payer: No Typology Code available for payment source

## 2021-04-03 DIAGNOSIS — Z803 Family history of malignant neoplasm of breast: Secondary | ICD-10-CM

## 2021-04-03 DIAGNOSIS — E785 Hyperlipidemia, unspecified: Secondary | ICD-10-CM | POA: Diagnosis present

## 2021-04-03 DIAGNOSIS — Z801 Family history of malignant neoplasm of trachea, bronchus and lung: Secondary | ICD-10-CM

## 2021-04-03 DIAGNOSIS — H47012 Ischemic optic neuropathy, left eye: Secondary | ICD-10-CM

## 2021-04-03 DIAGNOSIS — Z8261 Family history of arthritis: Secondary | ICD-10-CM

## 2021-04-03 DIAGNOSIS — Z20822 Contact with and (suspected) exposure to covid-19: Secondary | ICD-10-CM | POA: Diagnosis present

## 2021-04-03 DIAGNOSIS — H5462 Unqualified visual loss, left eye, normal vision right eye: Secondary | ICD-10-CM | POA: Diagnosis present

## 2021-04-03 DIAGNOSIS — L930 Discoid lupus erythematosus: Secondary | ICD-10-CM | POA: Diagnosis present

## 2021-04-03 DIAGNOSIS — H469 Unspecified optic neuritis: Principal | ICD-10-CM | POA: Diagnosis present

## 2021-04-03 DIAGNOSIS — G912 (Idiopathic) normal pressure hydrocephalus: Secondary | ICD-10-CM

## 2021-04-03 DIAGNOSIS — Z823 Family history of stroke: Secondary | ICD-10-CM

## 2021-04-03 DIAGNOSIS — Z833 Family history of diabetes mellitus: Secondary | ICD-10-CM

## 2021-04-03 DIAGNOSIS — Z841 Family history of disorders of kidney and ureter: Secondary | ICD-10-CM

## 2021-04-03 DIAGNOSIS — Z79899 Other long term (current) drug therapy: Secondary | ICD-10-CM

## 2021-04-03 DIAGNOSIS — Z8042 Family history of malignant neoplasm of prostate: Secondary | ICD-10-CM

## 2021-04-03 DIAGNOSIS — I10 Essential (primary) hypertension: Secondary | ICD-10-CM | POA: Diagnosis present

## 2021-04-03 DIAGNOSIS — Z8249 Family history of ischemic heart disease and other diseases of the circulatory system: Secondary | ICD-10-CM

## 2021-04-03 LAB — URINALYSIS, ROUTINE W REFLEX MICROSCOPIC
Bilirubin Urine: NEGATIVE
Glucose, UA: NEGATIVE mg/dL
Ketones, ur: 20 mg/dL — AB
Nitrite: POSITIVE — AB
Protein, ur: NEGATIVE mg/dL
Specific Gravity, Urine: 1.044 — ABNORMAL HIGH (ref 1.005–1.030)
pH: 7 (ref 5.0–8.0)

## 2021-04-03 LAB — CBC WITH DIFFERENTIAL/PLATELET
Abs Immature Granulocytes: 0.01 10*3/uL (ref 0.00–0.07)
Basophils Absolute: 0 10*3/uL (ref 0.0–0.1)
Basophils Relative: 1 %
Eosinophils Absolute: 0 10*3/uL (ref 0.0–0.5)
Eosinophils Relative: 1 %
HCT: 41.7 % (ref 36.0–46.0)
Hemoglobin: 14.2 g/dL (ref 12.0–15.0)
Immature Granulocytes: 0 %
Lymphocytes Relative: 37 %
Lymphs Abs: 2.1 10*3/uL (ref 0.7–4.0)
MCH: 32.2 pg (ref 26.0–34.0)
MCHC: 34.1 g/dL (ref 30.0–36.0)
MCV: 94.6 fL (ref 80.0–100.0)
Monocytes Absolute: 0.4 10*3/uL (ref 0.1–1.0)
Monocytes Relative: 7 %
Neutro Abs: 3.1 10*3/uL (ref 1.7–7.7)
Neutrophils Relative %: 54 %
Platelets: 285 10*3/uL (ref 150–400)
RBC: 4.41 MIL/uL (ref 3.87–5.11)
RDW: 13.3 % (ref 11.5–15.5)
WBC: 5.6 10*3/uL (ref 4.0–10.5)
nRBC: 0 % (ref 0.0–0.2)

## 2021-04-03 LAB — HEMOGLOBIN A1C
Hgb A1c MFr Bld: 5.2 % (ref 4.8–5.6)
Mean Plasma Glucose: 102.54 mg/dL

## 2021-04-03 LAB — C-REACTIVE PROTEIN: CRP: 0.6 mg/dL (ref <1.0)

## 2021-04-03 LAB — COMPREHENSIVE METABOLIC PANEL
ALT: 20 U/L (ref 0–44)
AST: 23 U/L (ref 15–41)
Albumin: 4.1 g/dL (ref 3.5–5.0)
Alkaline Phosphatase: 51 U/L (ref 38–126)
Anion gap: 9 (ref 5–15)
BUN: <5 mg/dL — ABNORMAL LOW (ref 8–23)
CO2: 25 mmol/L (ref 22–32)
Calcium: 9.7 mg/dL (ref 8.9–10.3)
Chloride: 104 mmol/L (ref 98–111)
Creatinine, Ser: 0.4 mg/dL — ABNORMAL LOW (ref 0.44–1.00)
GFR, Estimated: >60 mL/min (ref >60)
Glucose, Bld: 113 mg/dL — ABNORMAL HIGH (ref 70–99)
Potassium: 3.2 mmol/L — ABNORMAL LOW (ref 3.5–5.1)
Sodium: 138 mmol/L (ref 135–145)
Total Bilirubin: 0.5 mg/dL (ref 0.3–1.2)
Total Protein: 8.5 g/dL — ABNORMAL HIGH (ref 6.5–8.1)

## 2021-04-03 LAB — HIV ANTIBODY (ROUTINE TESTING W REFLEX): HIV Screen 4th Generation wRfx: NONREACTIVE

## 2021-04-03 LAB — APTT: aPTT: 24 seconds (ref 24–36)

## 2021-04-03 LAB — RESP PANEL BY RT-PCR (FLU A&B, COVID) ARPGX2
Influenza A by PCR: NEGATIVE
Influenza B by PCR: NEGATIVE
SARS Coronavirus 2 by RT PCR: NEGATIVE

## 2021-04-03 LAB — LIPID PANEL
Cholesterol: 250 mg/dL — ABNORMAL HIGH (ref 0–200)
HDL: 70 mg/dL (ref >40)
LDL Cholesterol: 153 mg/dL — ABNORMAL HIGH (ref 0–99)
Total CHOL/HDL Ratio: 3.6 RATIO
Triglycerides: 133 mg/dL (ref <150)
VLDL: 27 mg/dL (ref 0–40)

## 2021-04-03 LAB — RAPID URINE DRUG SCREEN, HOSP PERFORMED
Amphetamines: NOT DETECTED
Barbiturates: NOT DETECTED
Benzodiazepines: NOT DETECTED
Cocaine: NOT DETECTED
Opiates: NOT DETECTED
Tetrahydrocannabinol: POSITIVE — AB

## 2021-04-03 LAB — PROTIME-INR
INR: 1 (ref 0.8–1.2)
Prothrombin Time: 12.8 seconds (ref 11.4–15.2)

## 2021-04-03 LAB — SEDIMENTATION RATE: Sed Rate: 45 mm/hr — ABNORMAL HIGH (ref 0–22)

## 2021-04-03 LAB — LDL CHOLESTEROL, DIRECT: Direct LDL: 158.6 mg/dL — ABNORMAL HIGH (ref 0–99)

## 2021-04-03 IMAGING — MR MR HEAD WO/W CM
12 of 18 series · 32 of 48 positions shown · IV contrast (gadavist)
Comparison: CT head [DATE]

CLINICAL DATA: Loss of vision in left eye, suspect optic neuritis
versus stroke

EXAM:
MRI HEAD AND ORBITS WITHOUT AND WITH CONTRAST
TECHNIQUE: Multiplanar, multiecho pulse sequences of the brain and surrounding
structures were obtained without and with intravenous contrast.
Multiplanar, multiecho pulse sequences of the orbits and surrounding
structures were obtained including fat saturation techniques, before
and after intravenous contrast administration.
CONTRAST:  6.5mL GADAVIST GADOBUTROL 1 MMOL/ML IV SOLN

[Series 3: DWI · axial · 3.0mm · 1.09mm/px · z∈[-62,+79]mm · 7 of 96 slices shown (1 of 4)]
[im 1/96]
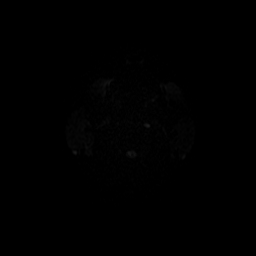
[im 16/96]
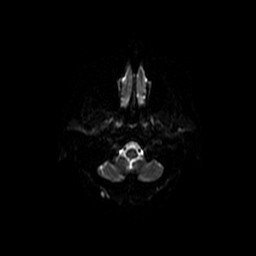
[im 32/96]
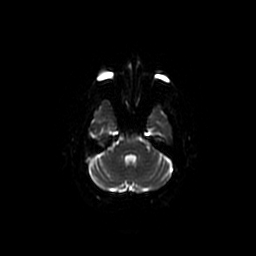
[im 48/96]
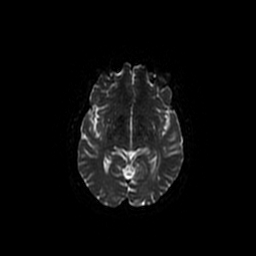
[im 64/96]
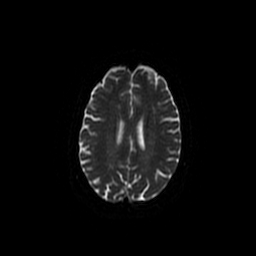
[im 80/96]
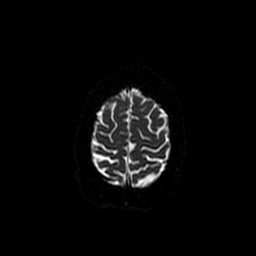
[im 96/96]
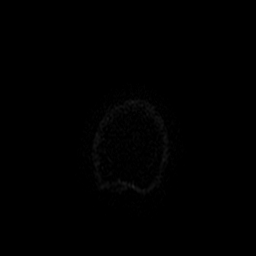

[Series 4: DWI · coronal · 5.0mm · 1.09mm/px · 5 of 72 slices shown (2 of 4)]
[im 1/72]
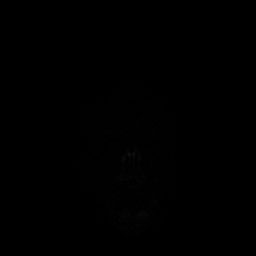
[im 18/72]
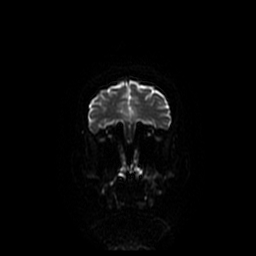
[im 36/72]
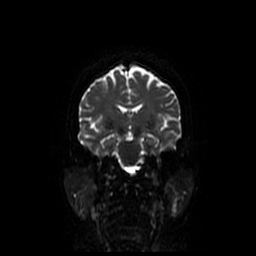
[im 54/72]
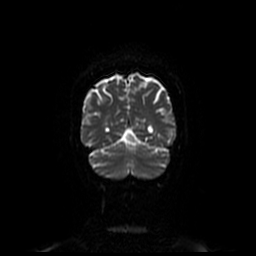
[im 72/72]
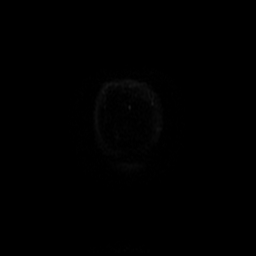

[Series 6: T2 · axial · 5.0mm · 0.43mm/px · z∈[-76,+67]mm · 2 of 25 slices shown]
[im 1/25]
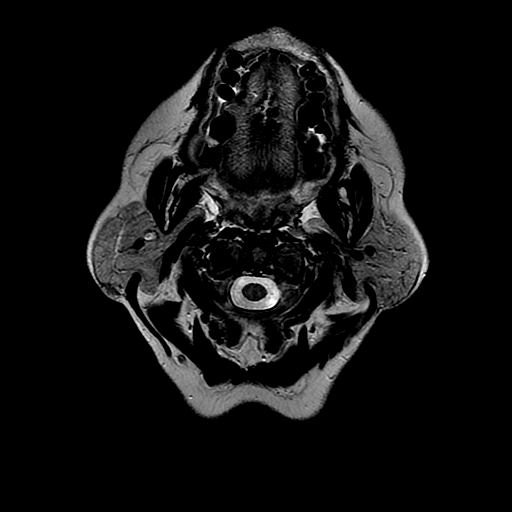
[im 25/25]
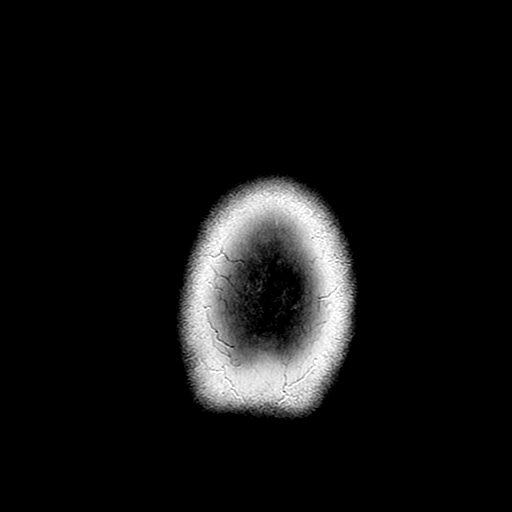

[Series 7: FLAIR · axial · 3.0mm · 0.43mm/px · z∈[-76,+67]mm · 2 of 25 slices shown]
[im 1/25]
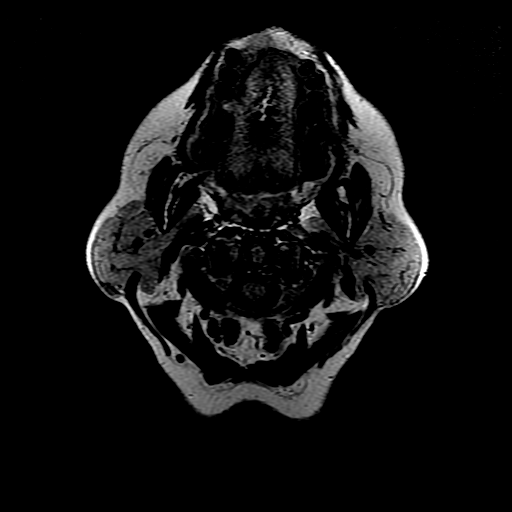
[im 25/25]
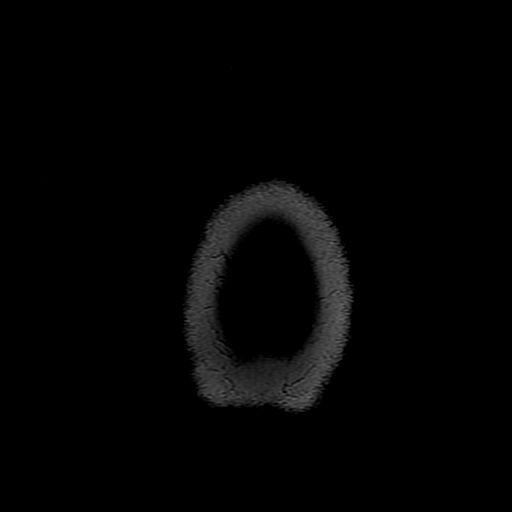

[Series 12: T2 fat-sat · axial · 3.0mm · 0.35mm/px · 1 of 17 slices shown (1 of 2)]
[im 1/17]
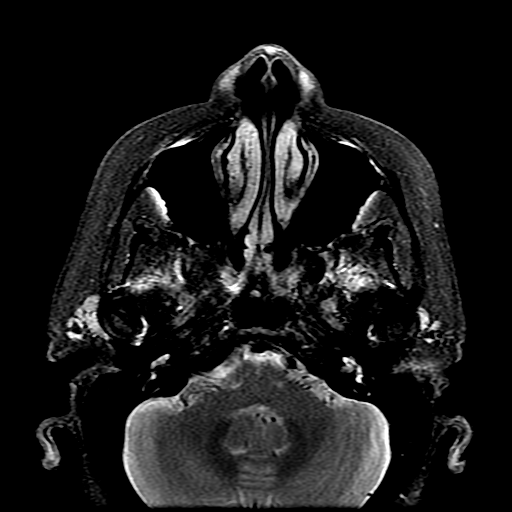

[Series 13: T2 fat-sat · coronal · 3.0mm · 0.35mm/px · 2 of 31 slices shown (2 of 2)]
[im 1/31]
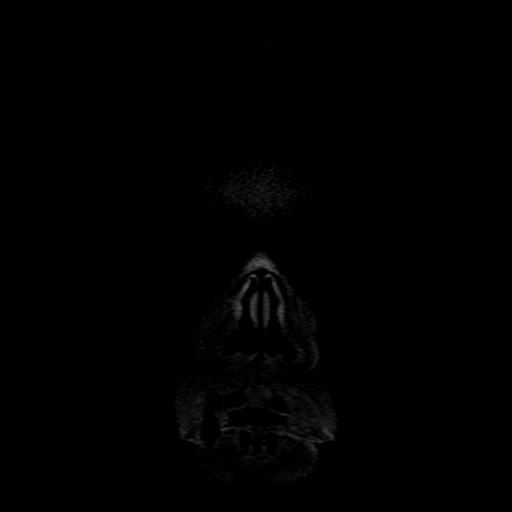
[im 31/31]
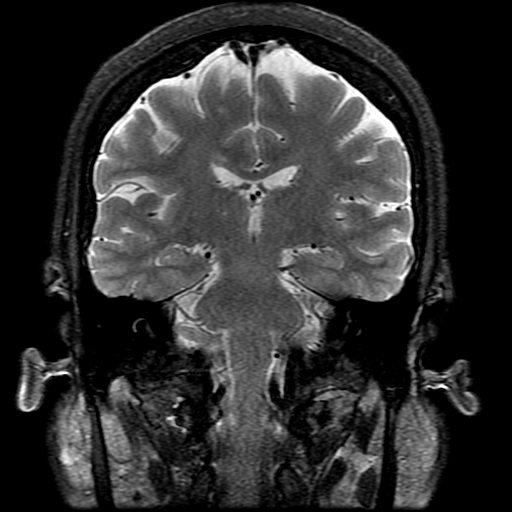

[Series 14: T2 post-contrast · coronal · 5.0mm · 0.39mm/px · 1 of 26 slices shown]
[im 1/26]
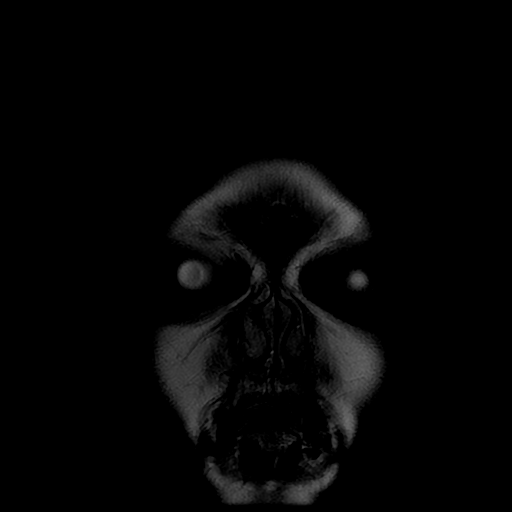

[Series 16: T1 post-contrast · coronal · 5.0mm · 0.43mm/px · 2 of 28 slices shown (1 of 3)]
[im 1/28]
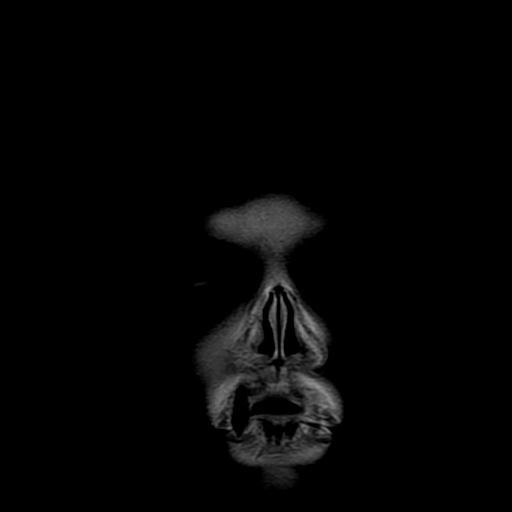
[im 28/28]
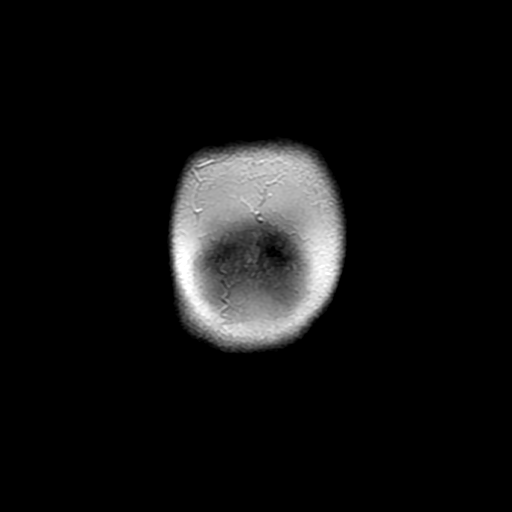

[Series 17: T1 post-contrast · axial · 3.0mm · 0.35mm/px · 1 of 17 slices shown (2 of 3)]
[im 1/17]
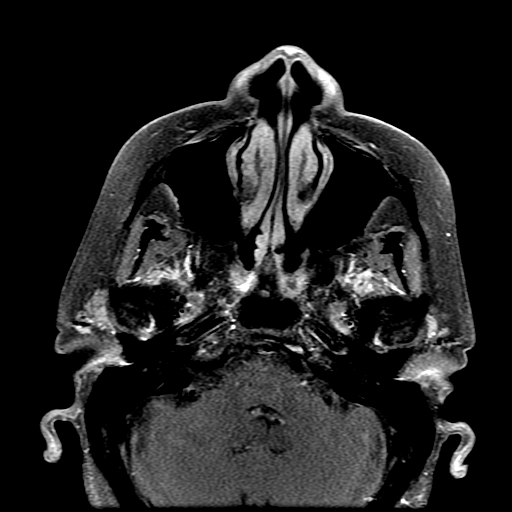

[Series 18: T1 post-contrast · coronal · 3.0mm · 0.35mm/px · 2 of 31 slices shown (3 of 3)]
[im 1/31]
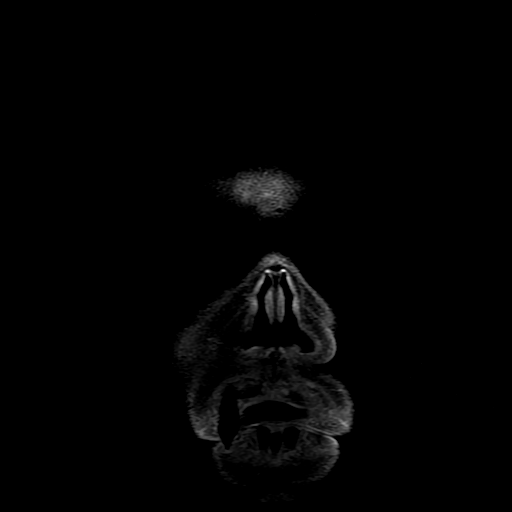
[im 31/31]
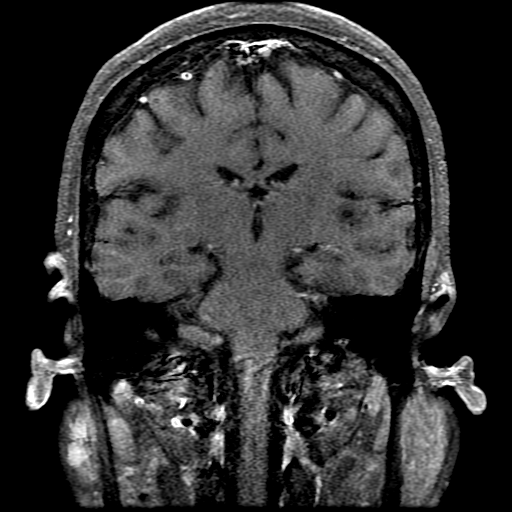

[Series 300: DWI · axial · 3.0mm · 1.09mm/px · z∈[-62,+79]mm · 4 of 48 slices shown (3 of 4)]
[im 1/48]
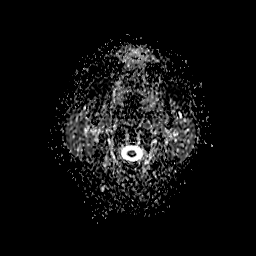
[im 16/48]
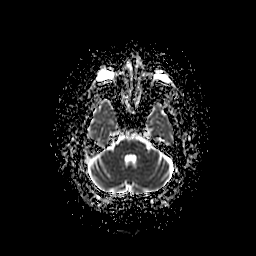
[im 32/48]
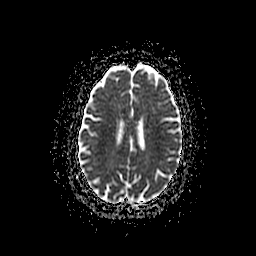
[im 48/48]
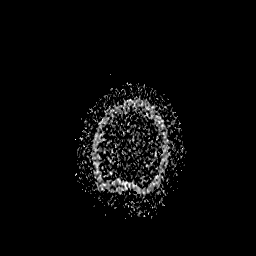

[Series 400: DWI · coronal · 5.0mm · 1.09mm/px · 3 of 36 slices shown (4 of 4)]
[im 1/36]
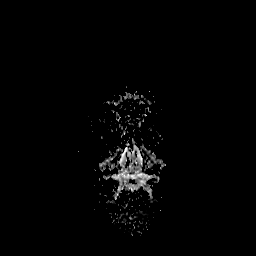
[im 18/36]
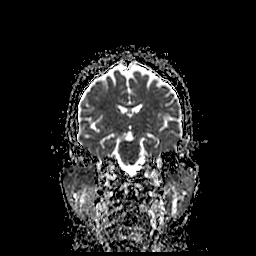
[im 36/36]
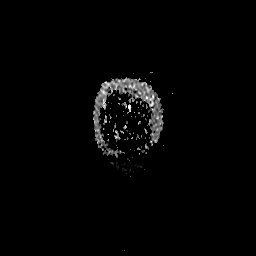

[32 of 48 positions shown; findings below may reference images not displayed]

FINDINGS: MRI HEAD FINDINGS

Brain: There is no evidence of acute intracranial hemorrhage,
extra-axial fluid collection, or acute infarct.

There are multiple foci of FLAIR signal abnormality throughout the
subcortical and periventricular white matter, nonspecific. There is
no associated enhancement or diffusion restriction.

The ventricles are normal in size. There is no solid mass lesion.
There is no midline shift.

Vascular: Normal flow voids.

Skull and upper cervical spine: Normal marrow signal.

Other: None.

MRI ORBITS FINDINGS

Orbits: There is mild increased T2 signal in the left optic nerve
with associated pain enhancement. There is diffusion restriction
along the left optic nerve. There is no significant inflammatory
change in the surrounding orbital fat. The left globe is
unremarkable. The left extraocular muscles are normal. The right
globe, orbit, and optic nerve are normal.

Visualized sinuses: The paranasal sinuses are clear.

Soft tissues: Unremarkable.
IMPRESSION: 1. Abnormal T2 signal and feint enhancement and possible diffusion
restriction in the left optic nerve, without significant
inflammatory change in the surrounding fat. Findings are suggestive
of optic neuritis with differential including optic nerve ischemia.
2. Patchy foci of FLAIR signal abnormality in the subcortical and
periventricular white matter are nonspecific. These most commonly
reflect sequela of chronic white matter microangiopathy, with
differential including demyelination.

## 2021-04-03 IMAGING — CT CT ANGIO HEAD-NECK (W OR W/O PERF)
1 of 11 series · 5 of 33 positions shown · IV contrast (OMNI 350)
Comparison: Same-day brain MRI

CLINICAL DATA: Monocular vision loss

EXAM:
CT ANGIOGRAPHY HEAD AND NECK
TECHNIQUE: Multidetector CT imaging of the head and neck was performed using
the standard protocol during bolus administration of intravenous
contrast. Multiplanar CT image reconstructions and MIPs were
obtained to evaluate the vascular anatomy. Carotid stenosis
measurements (when applicable) are obtained utilizing NASCET
criteria, using the distal internal carotid diameter as the
denominator.
CONTRAST:  60mL OMNIPAQUE IOHEXOL 350 MG/ML SOLN

[Series 12: cta neck axial · axial · 0.39mm/px · z∈[+1278,+1486]mm · 5 of 337 slices shown]
[im 57/337  soft-tissue]
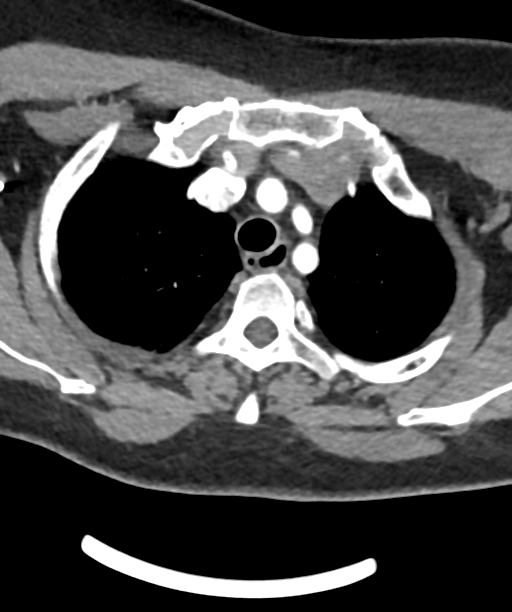
[im 113/337  bone]
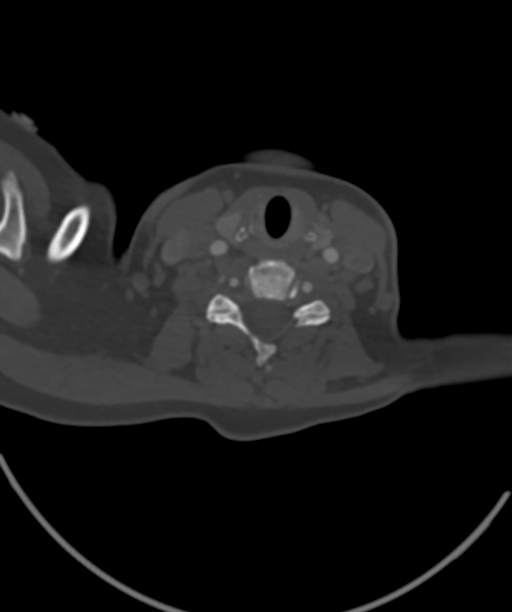
[im 169/337  soft-tissue]
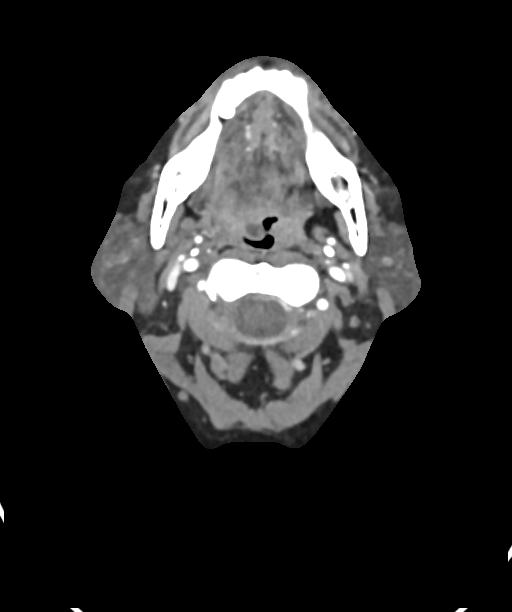
[im 225/337  bone]
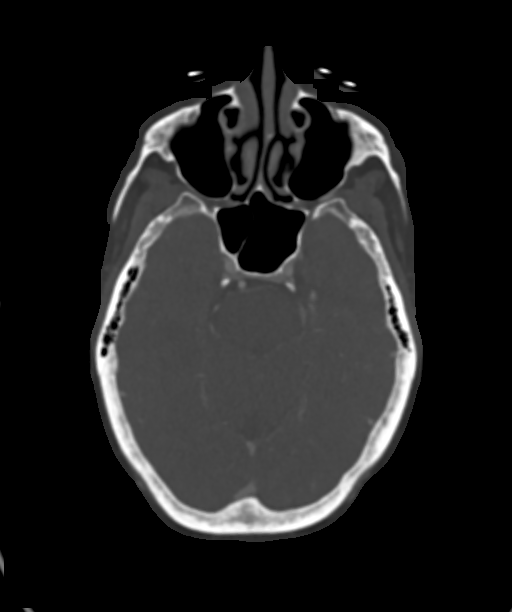
[im 281/337  soft-tissue]
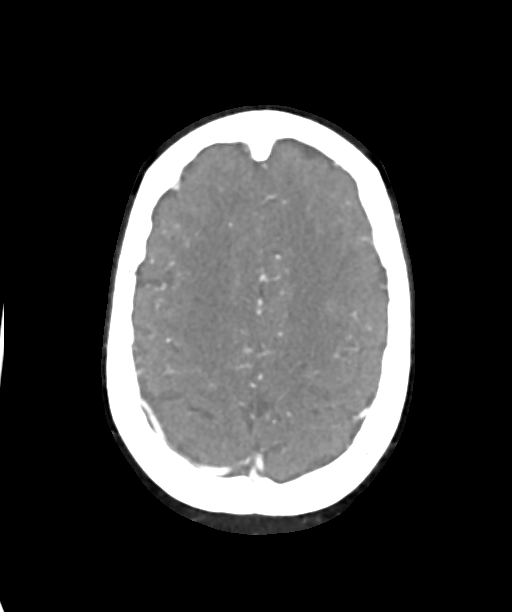

[5 of 33 positions shown; findings below may reference images not displayed]

FINDINGS: CT HEAD FINDINGS

Brain: There is no evidence of acute intracranial hemorrhage,
extra-axial fluid collection, or acute infarct.

Parenchymal volume is normal. The ventricles are normal in size.
Scattered foci of hypodensity in the subcortical and periventricular
white matter are noted corresponding to FLAIR signal abnormality
seen on the same-day brain MRI.

There is no solid mass lesion.  There is no midline shift.

Vascular: See below

Skull: Normal. Negative for fracture or focal lesion.

Sinuses: The imaged paranasal sinuses are clear.

Orbits: The globes and orbits are unremarkable, though better
assessed on the same day orbital MRI.

Review of the MIP images confirms the above findings

CTA NECK FINDINGS

Aortic arch: Standard branching. Imaged portion shows no evidence of
aneurysm or dissection. No significant stenosis of the major arch
vessel origins.

Right carotid system: There is mild soft and calcified
atherosclerotic plaque at the proximal right internal carotid artery
without hemodynamically significant stenosis or occlusion. There is
no dissection or aneurysm. The right common and external carotid
arteries are patent.

Left carotid system: There is mild soft and calcified
atherosclerotic plaque at the proximal left internal carotid artery
without hemodynamically significant stenosis or occlusion. There is
no dissection or aneurysm. The left common and external carotid
arteries are patent.

Vertebral arteries: The vertebral arteries are patent, without
hemodynamically significant stenosis, occlusion, dissection, or
aneurysm.

Skeleton: There is no acute osseous abnormality or aggressive
osseous lesion. There is no visible canal hematoma.

Other neck: There is a subcentimeter left thyroid nodule. The soft
tissues are otherwise unremarkable.

Upper chest: The imaged lung apices are clear.

Review of the MIP images confirms the above findings

CTA HEAD FINDINGS

Anterior circulation: The cavernous ICAs are patent. The ophthalmic
arteries are identified.

The bilateral MCAs and ACAs are patent.

There is no aneurysm.

Posterior circulation: The bilateral V4 segments are patent. The
bilateral PCAs are patent. The posterior communicating arteries are
not identified.

There is no aneurysm.

Venous sinuses: As permitted by contrast timing, patent.

Anatomic variants: None.

Review of the MIP images confirms the above findings
IMPRESSION: 1. No acute intracranial pathology.
2. Mild calcified atherosclerotic plaque in the bilateral carotid
bulbs without hemodynamically significant stenosis or occlusion.
Otherwise, patent vasculature of the head and neck.

## 2021-04-03 MED ORDER — SODIUM CHLORIDE 0.9 % IV SOLN
500.0000 mg | Freq: Once | INTRAVENOUS | Status: DC
  Filled 2021-04-03: qty 8

## 2021-04-03 MED ORDER — GADOBUTROL 1 MMOL/ML IV SOLN
6.5000 mL | Freq: Once | INTRAVENOUS | Status: AC | PRN
  Administered 2021-04-03: 6.5 mL via INTRAVENOUS

## 2021-04-03 MED ORDER — ENOXAPARIN SODIUM 40 MG/0.4ML IJ SOSY
40.0000 mg | PREFILLED_SYRINGE | INTRAMUSCULAR | Status: DC
  Administered 2021-04-03 – 2021-04-07 (×5): 40 mg via SUBCUTANEOUS
  Filled 2021-04-03 (×4): qty 0.4

## 2021-04-03 MED ORDER — OYSTER SHELL CALCIUM/D3 500-5 MG-MCG PO TABS
1.0000 | ORAL_TABLET | Freq: Every day | ORAL | Status: DC
  Administered 2021-04-03 – 2021-04-08 (×6): 1 via ORAL
  Filled 2021-04-03 (×7): qty 1

## 2021-04-03 MED ORDER — ASPIRIN EC 81 MG PO TBEC
81.0000 mg | DELAYED_RELEASE_TABLET | Freq: Every day | ORAL | Status: DC
  Administered 2021-04-03 – 2021-04-08 (×6): 81 mg via ORAL
  Filled 2021-04-03 (×6): qty 1

## 2021-04-03 MED ORDER — IOHEXOL 350 MG/ML SOLN: 60.0000 mL | Freq: Once | INTRAVENOUS | Status: DC | PRN

## 2021-04-03 MED ORDER — PANTOPRAZOLE SODIUM 40 MG IV SOLR
40.0000 mg | INTRAVENOUS | Status: AC
  Administered 2021-04-03 – 2021-04-07 (×5): 40 mg via INTRAVENOUS
  Filled 2021-04-03 (×5): qty 40

## 2021-04-03 MED ORDER — NICOTINE 7 MG/24HR TD PT24
7.0000 mg | MEDICATED_PATCH | Freq: Every day | TRANSDERMAL | Status: DC
  Administered 2021-04-03 – 2021-04-07 (×5): 7 mg via TRANSDERMAL
  Filled 2021-04-03 (×6): qty 1

## 2021-04-03 MED ORDER — POTASSIUM CHLORIDE CRYS ER 20 MEQ PO TBCR
40.0000 meq | EXTENDED_RELEASE_TABLET | Freq: Once | ORAL | Status: AC
  Administered 2021-04-03: 40 meq via ORAL
  Filled 2021-04-03: qty 2

## 2021-04-03 MED ORDER — AMLODIPINE BESYLATE 10 MG PO TABS
10.0000 mg | ORAL_TABLET | Freq: Every day | ORAL | Status: DC
  Administered 2021-04-03 – 2021-04-08 (×6): 10 mg via ORAL
  Filled 2021-04-03 (×6): qty 1

## 2021-04-03 MED ORDER — TRIAMCINOLONE ACETONIDE 0.025 % EX CREA
1.0000 "application " | TOPICAL_CREAM | Freq: Three times a day (TID) | CUTANEOUS | Status: DC
  Administered 2021-04-04 – 2021-04-06 (×3): 1 via TOPICAL
  Filled 2021-04-03 (×3): qty 15

## 2021-04-03 MED ORDER — SODIUM CHLORIDE 0.9 % IV SOLN
1000.0000 mg | INTRAVENOUS | Status: AC
  Administered 2021-04-03 – 2021-04-07 (×5): 1000 mg via INTRAVENOUS
  Filled 2021-04-03 (×6): qty 16

## 2021-04-03 MED ORDER — IOHEXOL 350 MG/ML SOLN
60.0000 mL | Freq: Once | INTRAVENOUS | Status: AC | PRN
  Administered 2021-04-03: 60 mL via INTRAVENOUS

## 2021-04-03 MED ORDER — LACTATED RINGERS IV BOLUS
1000.0000 mL | Freq: Once | INTRAVENOUS | Status: AC
  Administered 2021-04-03: 1000 mL via INTRAVENOUS

## 2021-04-03 NOTE — ED Notes (Signed)
Pt in MRI unable to obtain vitals right now  °

## 2021-04-03 NOTE — ED Triage Notes (Signed)
Pt states on Wednesday and Thursday of last week she started seeing floaters out of L eye and upon waking on Friday total vision loss from L eye. Pt was seen at [facility] for concern of GCA and optic neuritis.

## 2021-04-03 NOTE — ED Provider Notes (Signed)
Emergency Medicine Provider Triage Evaluation Note  Jacqueline Fox , a 65 y.o. female  was evaluated in triage.  Pt complains of sudden vision loss in her left eye.  She states that around Wednesday last week she began having floaters in the left eye and then woke up Friday with total loss of vision in the left eye.  She states that she was seen at [facility] today with concern of optic neuritis.  They sent her here to the emergency department for continued work-up.  She denies fever, headache, nausea or vomiting.  She does endorse that she has "discomfort" over the left eye.    Review of Systems  Positive: See above Negative:   Physical Exam  BP 127/86 (BP Location: Left Arm)   Pulse 84   Temp 98 F (36.7 C) (Oral)   Resp 16   Ht 5\' 4"  (1.626 m)   Wt 67.1 kg   LMP 02/27/2011   SpO2 100%   BMI 25.40 kg/m  Gen:   Awake, no distress   Resp:  Normal effort  MSK:   Moves extremities without difficulty  Other:  Visual field deficit in the left eye.  Eyes are dilated from her ophthalmology appointment.  There is no redness, swelling, or cordlike protrusion of the temporal artery.  She has no headache.  There is no focal neurological deficits on exam.  Medical Decision Making  Medically screening exam initiated at 12:45 PM.  Appropriate orders placed.  03/01/2011 was informed that the remainder of the evaluation will be completed by another provider, this initial triage assessment does not replace that evaluation, and the importance of remaining in the ED until their evaluation is complete.    Nat Christen, PA-C 04/03/21 1249    04/05/21, MD 04/03/21 [phone]

## 2021-04-03 NOTE — Progress Notes (Signed)
Neurology brief note  65 yo woman with L eye vision loss since Wed, no unable to see anything in L eye + pain on L eye movement. Examination reveals L APD. MRI brain and orbits wwo contrast c/f optic neuritis vs ischemic optic neuropathy. No definitive demyelinating parenchymal lesions on MRI brain. LP recommended for IgG index and oligoclonal bands; presence of inflammatory markers in CSF would support dx of optic neuritis. LP attempted in ED but no fluid obtained. Given severity of sx will start empiric high dose solumedrol tonight and plan for IR LP in AM. MRI c and t spine wwo contrast tmrw afternoon. Full consult note to follow.  Bing Neighbors, MD [facility] [phone]  If 7pm- 7am, please page neurology on call as listed in AMION.

## 2021-04-03 NOTE — ED Provider Notes (Signed)
[facility] DEPARTMENT Provider Note   CSN: [id number] Arrival date & time: 04/03/21  1214     History Chief Complaint  Patient presents with   Loss of Vision    Jacqueline Fox is a 65 y.o. female.  HPI Patient presents for loss of vision in her left eye.  She experienced floaters 6 days ago.  4 days ago, she woke up with total loss of vision in the left eye.  This loss of vision was painless.  She saw an optometrist the following day.  She was referred to an ophthalmologist.  Earlier today, she was seen by retina specialist for concern of optic neuritis.  She was sent to the ED for further evaluation.  Patient describes her loss of vision as complete.  She is not able to see light or movement from her left eye.  She does wear glasses, but has no history of left eye problems.  She has no history of stroke.  Currently, she does not take any blood thinning medications or antiplatelet medications.  Patient has not had any other symptoms besides her loss of vision.  She denies any jaw claudication or temporal pain.  She does feel some slight tenderness when she presses on her left eye.    Past Medical History:  Diagnosis Date   Discoid lupus    Follows with Derm   Family history of breast cancer    Family history of cholangiocarcinoma    Sister   Family history of colon cancer    Patient has had genetic testing and not found to have genetic predisposition.  Recommendations for cancer screenings as usual per Tamsen Meek her note 2019 please   Family history of lung cancer    Brother   Family history of prostate cancer    Genetic testing 11/27/2017   Negative genetic testing on the Multicancer panel.  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   Post-menopausal 08/11/2020   Mixed hyperlipidemia 08/11/2020   Family history of cholangiocarcinoma    Family history of lung cancer    Screening breast examination 04/29/2019   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   Genetic testing 11/27/2017   Family history of colon cancer    Family history of breast cancer    Family history of prostate cancer    Keloid 10/22/2017   Family history of colon cancer in father 10/22/2017   Seborrhea capitis in adult 10/22/2017   Dental decay 10/22/2017   Family history of cancer 09/19/2017   Hypertension 09/19/2017   Hypopigmentation 09/19/2017    Past Surgical History:  Procedure Laterality Date   COLONOSCOPY WITH PROPOFOL N/A 02/12/2018   Procedure: COLONOSCOPY WITH PROPOFOL;  Surgeon: Ronald Lobo, MD;  Location: [facility];  Service: Endoscopy;  Laterality: N/A;     OB History     Gravida  5   Para      Term      Preterm      AB      Living         SAB  IAB      Ectopic      Multiple      Live Births  4           Family History  Problem Relation Age of Onset   Diabetes Mother    Heart disease Mother        CHF   Hypertension Mother    Kidney disease Mother    Arthritis Mother        OA   Dementia Mother    Cancer Father 108       colon and prostate   Cancer Sister 28       ?cholangiocarcinoma   Cancer Brother 79       lung--smoker  also, prostate CA when 36-54 yo   Kidney disease Son        Stage IV CKD from poorly controlled Htn   Hypertension Son    Cancer Brother 69       prostate   Cancer Brother 44       Prostate--treated successfuly   Cancer Other 4       colon--sister's daughter (sister with "gallbladder duct" cancer)   Cancer Paternal Aunt 57        Breast Cancer   Breast cancer Paternal Aunt    Cancer Paternal Aunt        Bone cancer   Stroke Paternal Grandmother    Prostate cancer Cousin        mat first cousin    Social History   Tobacco Use   Smoking status: Every Day    Packs/day: 0.25    Years: 43.00    Pack years: 10.75    Types: Cigarettes   Smokeless tobacco: Never   Tobacco comments:    Not clear if she is ready to quit, despite family history of cancers.  Vaping Use   Vaping Use: Never used  Substance Use Topics   Alcohol use: Yes    Comment: Bottle of wine over 3 day weekend.     Drug use: Yes    Types: Marijuana    Comment: edibles and smoking.    Home Medications Prior to Admission medications   Medication Sig Start Date End Date Taking? Authorizing Provider  amLODipine (NORVASC) 10 MG tablet Take 1 tablet by mouth daily Patient taking differently: Take 10 mg by mouth daily. 10/02/20  Yes Mack Hook, MD  calcium citrate-vitamin D 500-500 MG-UNIT chewable tablet 1 tab by mouth twice daily 08/11/20  Yes Mack Hook, MD  Fluocinolone Acetonide Scalp (DERMA-SMOOTHE/FS SCALP) 0.01 % OIL Apply to scalp at bedtime as per instructions 08/25/20  Yes Mack Hook, MD  Multiple Vitamin (MULTIVITAMIN) tablet Take 1 tablet by mouth daily.   Yes [provider]  triamcinolone (KENALOG) 0.025 % ointment Apply scant amount to affected areas once daily as needed. Patient taking differently: Apply 1 application topically. Apply 1 application to body once daily as needed. 02/10/19  Yes Mack Hook, MD    Allergies    Patient has no known allergies.  Review of Systems   Review of Systems  Constitutional:  Negative for activity change, appetite change, chills, fatigue and fever.  HENT:  Negative for congestion, ear pain, sore throat, trouble swallowing and voice change.   Eyes:  Positive for visual disturbance. Negative for photophobia, pain, discharge and redness.  Respiratory:   Negative for cough, chest tightness and shortness of breath.   Cardiovascular:  Negative for chest pain and palpitations.  Gastrointestinal:  Negative  for abdominal pain, nausea and vomiting.  Genitourinary:  Negative for dysuria and hematuria.  Musculoskeletal:  Negative for arthralgias, back pain, myalgias and neck pain.  Skin:  Negative for color change and rash.  Neurological:  Negative for dizziness, seizures, syncope, speech difficulty, weakness, light-headedness, numbness and headaches.  Hematological:  Does not bruise/bleed easily.  Psychiatric/Behavioral:  Negative for confusion and decreased concentration.   All other systems reviewed and are negative.  Physical Exam Updated Vital Signs BP (!) 145/80   Pulse 73   Temp 98 F (36.7 C) (Oral)   Resp 19   Ht '5\' 4"'  (1.626 m)   Wt 67.1 kg   LMP 02/27/2011   SpO2 98%   BMI 25.40 kg/m   Physical Exam Vitals and nursing note reviewed.  Constitutional:      General: She is not in acute distress.    Appearance: Normal appearance. She is well-developed. She is not ill-appearing, toxic-appearing or diaphoretic.  HENT:     Head: Normocephalic and atraumatic.     Right Ear: External ear normal.     Left Ear: External ear normal.     Nose: Nose normal.     Mouth/Throat:     Mouth: Mucous membranes are moist.     Pharynx: Oropharynx is clear.  Eyes:     General: No scleral icterus.    Extraocular Movements: Extraocular movements intact.     Conjunctiva/sclera: Conjunctivae normal.     Comments: Baseline vision from right eye.  Complete loss of vision from left eye.  No retinal detachment or echogenic debris in vitreous identified on bedside ultrasound.  Cardiovascular:     Rate and Rhythm: Normal rate and regular rhythm.     Heart sounds: No murmur heard. Pulmonary:     Effort: Pulmonary effort is normal. No respiratory distress.     Breath sounds: Normal breath sounds. No wheezing or rales.  Abdominal:     Palpations:  Abdomen is soft.     Tenderness: There is no abdominal tenderness.  Musculoskeletal:        General: No swelling. Normal range of motion.     Cervical back: Normal range of motion and neck supple. No rigidity or tenderness.  Skin:    General: Skin is warm and dry.     Capillary Refill: Capillary refill takes less than 2 seconds.     Coloration: Skin is not jaundiced or pale.  Neurological:     Mental Status: She is alert.     Cranial Nerves: No dysarthria or facial asymmetry.     Sensory: Sensation is intact. No sensory deficit.     Motor: Motor function is intact. No weakness, abnormal muscle tone or pronator drift.     Coordination: Coordination is intact. Finger-Nose-Finger Test normal.  Psychiatric:        Mood and Affect: Mood normal.    ED Results / Procedures / Treatments   Labs (all labs ordered are listed, but only abnormal results are displayed) Labs Reviewed  COMPREHENSIVE METABOLIC PANEL - Abnormal; Notable for the following components:      Result Value   Potassium 3.2 (*)    Glucose, Bld 113 (*)    BUN <5 (*)    Creatinine, Ser 0.40 (*)    Total Protein 8.5 (*)    All other components within normal limits  SEDIMENTATION RATE - Abnormal; Notable for the following components:   Sed Rate 45 (*)    All other components within normal limits  RESP PANEL BY RT-PCR (FLU A&B, COVID) ARPGX2  CSF CULTURE W GRAM STAIN  CBC WITH DIFFERENTIAL/PLATELET  C-REACTIVE PROTEIN  PROTIME-INR  APTT  RAPID URINE DRUG SCREEN, HOSP PERFORMED  URINALYSIS, ROUTINE W REFLEX MICROSCOPIC  PROTEIN AND GLUCOSE, CSF  IGG CSF INDEX  DRAW EXTRA CLOT TUBE  OLIGOCLONAL BANDS, CSF + SERM  DRAW EXTRA CLOT TUBE  CSF CELL COUNT WITH DIFFERENTIAL  CSF CELL COUNT WITH DIFFERENTIAL  HIV ANTIBODY (ROUTINE TESTING W REFLEX)  LIPID PANEL  HEMOGLOBIN A1C  MAGNESIUM  LDL CHOLESTEROL, DIRECT    EKG None  Radiology CT ANGIO HEAD NECK W WO CM  Result Date: 04/03/2021 CLINICAL DATA:  Monocular  vision loss EXAM: CT ANGIOGRAPHY HEAD AND NECK TECHNIQUE: Multidetector CT imaging of the head and neck was performed using the standard protocol during bolus administration of intravenous contrast. Multiplanar CT image reconstructions and MIPs were obtained to evaluate the vascular anatomy. Carotid stenosis measurements (when applicable) are obtained utilizing NASCET criteria, using the distal internal carotid diameter as the denominator. CONTRAST:  55m OMNIPAQUE IOHEXOL 350 MG/ML SOLN COMPARISON:  Same-day brain MRI FINDINGS: CT HEAD FINDINGS Brain: There is no evidence of acute intracranial hemorrhage, extra-axial fluid collection, or acute infarct. Parenchymal volume is normal. The ventricles are normal in size. Scattered foci of hypodensity in the subcortical and periventricular white matter are noted corresponding to FLAIR signal abnormality seen on the same-day brain MRI. There is no solid mass lesion.  There is no midline shift. Vascular: See below Skull: Normal. Negative for fracture or focal lesion. Sinuses: The imaged paranasal sinuses are clear. Orbits: The globes and orbits are unremarkable, though better assessed on the same day orbital MRI. Review of the MIP images confirms the above findings CTA NECK FINDINGS Aortic arch: Standard branching. Imaged portion shows no evidence of aneurysm or dissection. No significant stenosis of the major arch vessel origins. Right carotid system: There is mild soft and calcified atherosclerotic plaque at the proximal right internal carotid artery without hemodynamically significant stenosis or occlusion. There is no dissection or aneurysm. The right common and external carotid arteries are patent. Left carotid system: There is mild soft and calcified atherosclerotic plaque at the proximal left internal carotid artery without hemodynamically significant stenosis or occlusion. There is no dissection or aneurysm. The left common and external carotid arteries are patent.  Vertebral arteries: The vertebral arteries are patent, without hemodynamically significant stenosis, occlusion, dissection, or aneurysm. Skeleton: There is no acute osseous abnormality or aggressive osseous lesion. There is no visible canal hematoma. Other neck: There is a subcentimeter left thyroid nodule. The soft tissues are otherwise unremarkable. Upper chest: The imaged lung apices are clear. Review of the MIP images confirms the above findings CTA HEAD FINDINGS Anterior circulation: The cavernous ICAs are patent. The ophthalmic arteries are identified. The bilateral MCAs and ACAs are patent. There is no aneurysm. Posterior circulation: The bilateral V4 segments are patent. The bilateral PCAs are patent. The posterior communicating arteries are not identified. There is no aneurysm. Venous sinuses: As permitted by contrast timing, patent. Anatomic variants: None. Review of the MIP images confirms the above findings IMPRESSION: 1. No acute intracranial pathology. 2. Mild calcified atherosclerotic plaque in the bilateral carotid bulbs without hemodynamically significant stenosis or occlusion. Otherwise, patent vasculature of the head and neck. Electronically Signed   By: PValetta MoleM.D.   On: 04/03/2021 16:04   MR BRAIN W WO CONTRAST  Result Date: 04/03/2021 CLINICAL DATA:  Loss of vision in left  eye, suspect optic neuritis versus stroke EXAM: MRI HEAD AND ORBITS WITHOUT AND WITH CONTRAST TECHNIQUE: Multiplanar, multiecho pulse sequences of the brain and surrounding structures were obtained without and with intravenous contrast. Multiplanar, multiecho pulse sequences of the orbits and surrounding structures were obtained including fat saturation techniques, before and after intravenous contrast administration. CONTRAST:  6.63m GADAVIST GADOBUTROL 1 MMOL/ML IV SOLN COMPARISON:  CT head 04/28/2006 FINDINGS: MRI HEAD FINDINGS Brain: There is no evidence of acute intracranial hemorrhage, extra-axial fluid  collection, or acute infarct. There are multiple foci of FLAIR signal abnormality throughout the subcortical and periventricular white matter, nonspecific. There is no associated enhancement or diffusion restriction. The ventricles are normal in size. There is no solid mass lesion. There is no midline shift. Vascular: Normal flow voids. Skull and upper cervical spine: Normal marrow signal. Other: None. MRI ORBITS FINDINGS Orbits: There is mild increased T2 signal in the left optic nerve with associated pain enhancement. There is diffusion restriction along the left optic nerve. There is no significant inflammatory change in the surrounding orbital fat. The left globe is unremarkable. The left extraocular muscles are normal. The right globe, orbit, and optic nerve are normal. Visualized sinuses: The paranasal sinuses are clear. Soft tissues: Unremarkable. IMPRESSION: 1. Abnormal T2 signal and feint enhancement and possible diffusion restriction in the left optic nerve, without significant inflammatory change in the surrounding fat. Findings are suggestive of optic neuritis with differential including optic nerve ischemia. 2. Patchy foci of FLAIR signal abnormality in the subcortical and periventricular white matter are nonspecific. These most commonly reflect sequela of chronic white matter microangiopathy, with differential including demyelination. Electronically Signed   By: PValetta MoleM.D.   On: 04/03/2021 15:33   MR ORBITS W WO CONTRAST  Result Date: 04/03/2021 CLINICAL DATA:  Loss of vision in left eye, suspect optic neuritis versus stroke EXAM: MRI HEAD AND ORBITS WITHOUT AND WITH CONTRAST TECHNIQUE: Multiplanar, multiecho pulse sequences of the brain and surrounding structures were obtained without and with intravenous contrast. Multiplanar, multiecho pulse sequences of the orbits and surrounding structures were obtained including fat saturation techniques, before and after intravenous contrast  administration. CONTRAST:  6.550mGADAVIST GADOBUTROL 1 MMOL/ML IV SOLN COMPARISON:  CT head 04/28/2006 FINDINGS: MRI HEAD FINDINGS Brain: There is no evidence of acute intracranial hemorrhage, extra-axial fluid collection, or acute infarct. There are multiple foci of FLAIR signal abnormality throughout the subcortical and periventricular white matter, nonspecific. There is no associated enhancement or diffusion restriction. The ventricles are normal in size. There is no solid mass lesion. There is no midline shift. Vascular: Normal flow voids. Skull and upper cervical spine: Normal marrow signal. Other: None. MRI ORBITS FINDINGS Orbits: There is mild increased T2 signal in the left optic nerve with associated pain enhancement. There is diffusion restriction along the left optic nerve. There is no significant inflammatory change in the surrounding orbital fat. The left globe is unremarkable. The left extraocular muscles are normal. The right globe, orbit, and optic nerve are normal. Visualized sinuses: The paranasal sinuses are clear. Soft tissues: Unremarkable. IMPRESSION: 1. Abnormal T2 signal and feint enhancement and possible diffusion restriction in the left optic nerve, without significant inflammatory change in the surrounding fat. Findings are suggestive of optic neuritis with differential including optic nerve ischemia. 2. Patchy foci of FLAIR signal abnormality in the subcortical and periventricular white matter are nonspecific. These most commonly reflect sequela of chronic white matter microangiopathy, with differential including demyelination. Electronically Signed   By: PeCollier Salina  Noone M.D.   On: 04/03/2021 15:33    Procedures .Lumbar Puncture  Date/Time: 04/03/2021 6:29 PM Performed by: Godfrey Pick, MD Authorized by: Godfrey Pick, MD   Consent:    Consent obtained:  Verbal   Consent given by:  Patient   Risks, benefits, and alternatives were discussed: yes     Risks discussed:  Bleeding,  infection, pain and repeat procedure   Alternatives discussed:  No treatment and delayed treatment Universal protocol:    Procedure explained and questions answered to patient or proxy's satisfaction: yes     Patient identity confirmed:  Verbally with patient Pre-procedure details:    Procedure purpose:  Diagnostic   Preparation: Patient was prepped and draped in usual sterile fashion   Anesthesia:    Anesthesia method:  Local infiltration   Local anesthetic:  Lidocaine 1% w/o epi Procedure details:    Lumbar space:  L4-L5 interspace   Patient position:  Sitting   Needle gauge:  20   Needle type:  Diamond point   Needle length (in):  3.5   Ultrasound guidance: no     Number of attempts:  1   Fluid appearance:  Blood-tinged   Tubes of fluid:  1   Total volume (ml):  0.5 Post-procedure details:    Puncture site:  Adhesive bandage applied   Procedure completion:  Tolerated well, no immediate complications   Medications Ordered in ED Medications  iohexol (OMNIPAQUE) 350 MG/ML injection 60 mL (has no administration in time range)  amLODipine (NORVASC) tablet 10 mg (has no administration in time range)  calcium-vitamin D (OSCAL WITH D) 500-5 MG-MCG per tablet 1 tablet (has no administration in time range)  triamcinolone (KENALOG) 2.947 % cream 1 application (has no administration in time range)  enoxaparin (LOVENOX) injection 40 mg (has no administration in time range)  aspirin EC tablet 81 mg (has no administration in time range)  potassium chloride SA (KLOR-CON) CR tablet 40 mEq (has no administration in time range)  lactated ringers bolus 1,000 mL (has no administration in time range)  nicotine (NICODERM CQ - dosed in mg/24 hr) patch 7 mg (has no administration in time range)  methylPREDNISolone sodium succinate (SOLU-MEDROL) 1,000 mg in sodium chloride 0.9 % 50 mL IVPB (has no administration in time range)    And  pantoprazole (PROTONIX) injection 40 mg (has no administration in  time range)  gadobutrol (GADAVIST) 1 MMOL/ML injection 6.5 mL (6.5 mLs Intravenous Contrast Given 04/03/21 1447)  iohexol (OMNIPAQUE) 350 MG/ML injection 60 mL (60 mLs Intravenous Contrast Given 04/03/21 1523)    ED Course  I have reviewed the triage vital signs and the nursing notes.  Pertinent labs & imaging results that were available during my care of the patient were reviewed by me and considered in my medical decision making (see chart for details).    MDM Rules/Calculators/A&P                          Patient is a previously healthy 65 year old female who presents for 4 days of complete vision loss to her left eye.  On arrival, vital signs are normal.  Patient is well-appearing.  She has no other focal logic deficits.  Prior to arrival, she did undergo ophthalmology evaluation at clinic.  She was sent to the ED for further evaluation.  I engaged neurology early on.  They did recommend MRI with and without contrast of brain and orbits.  Additionally, CTA studies of  head and neck were ordered.  Patient has no headache, temporal tenderness, or jaw claudication.  Additionally, CRP was normal.  Low suspicion for GCA.  MRI showed concern of optic neuritis or optic nerve ischemia.  I discussed these findings with neurology.  They did recommend lumbar puncture followed by additional MRI studies of neuraxis.  Patient was admitted to hospitalist for ongoing care.  Lumbar puncture was performed at bedside.  CSF was obtained on first attempt.  Flow of CSF stopped after only obtaining a small sample.  Resumption of flow unable to be obtained with small adjustments of needle.  Needle was removed and patient was placed in supine position.  Bolus of IV fluids was ordered.  She did tolerate this procedure well.  The sample was sent to the lab.  Priority studies to be determined by neurology.  Final Clinical Impression(s) / ED Diagnoses Final diagnoses:  Vision loss of left eye    Rx / DC Orders ED  Discharge Orders     None        Godfrey Pick, MD 04/03/21 [phone]

## 2021-04-03 NOTE — H&P (Signed)
History and Physical    Jacqueline Fox VFI:[id number] DOB: 27-Apr-1956 DOA: 04/03/2021  PCP: Mack Hook, MD (Confirm with patient/family/NH records and if not entered, this has to be entered at [facility] point of entry) Patient coming from: Home  I have personally briefly reviewed patient's old medical records in [place]  Chief Complaint: Can not see on left eye  HPI: Jacqueline Fox is a 65 y.o. 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  Her symptoms started last Wednesday, woke up with floaters in the left eye, and soreness when moving left eye, no fever or chills and denies any numbness or weakness of any of the limbs.  Her right eye vision has been intact and no floaters.  Symptoms of left eye floaters continue on Thursday and on Friday she woke up with total loss of vision on the left eye.  She continues to experience soreness of the left eye but no pain.  Called ophthalmology and set up appointment today.  Today, at ophthalmologist office, retinal study suspicious for GCA and or optic neuritis sent to ED.  ED Course: ESR 45, MRI T2 signal abnormality on left optic nerve with out significant inflammation changes in the surrounding flat, findings suggestive of optic neuritis with differential including optic nerve ischemia.  Review of Systems: As per HPI otherwise 14 point review of systems negative.    Past Medical History:  Diagnosis Date   Discoid lupus    Follows with Derm   Family history of breast cancer    Family history of cholangiocarcinoma    Sister   Family history of colon cancer    Patient has had genetic testing and not found to have genetic predisposition.  Recommendations for cancer screenings as usual per Tamsen Meek her note 2019 please   Family history of lung cancer    Brother   Family history of prostate cancer    Genetic testing 11/27/2017   Negative genetic  testing on the Multicancer panel.  The Multi-Gene Panel offered by [facility] includes sequencing and/or deletion duplication testing of the following 84 genes: AIP, ALK, APC, ATM, AXIN2,BAP1,  BARD1, BLM, BMPR1A, BRCA1, BRCA2, BRIP1, CASR, CDC73, CDH1, CDK4, CDKN1B, CDKN1C, CDKN2A (p14ARF), CDKN2A (p16INK4a), CEBPA, CHEK2, CTNNA1, DICER1, DIS3L2, EGFR (c.2369C>T, p.Thr790Met variant   Hyperlipidemia    Hypertension     Past Surgical History:  Procedure Laterality Date   COLONOSCOPY WITH PROPOFOL N/A 02/12/2018   Procedure: COLONOSCOPY WITH PROPOFOL;  Surgeon: Ronald Lobo, MD;  Location: [facility];  Service: Endoscopy;  Laterality: N/A;     reports that she has been smoking cigarettes. She has a 10.75 pack-year smoking history. She has never used smokeless tobacco. She reports current alcohol use. She reports current drug use. Drug: Marijuana.  No Known Allergies  Family History  Problem Relation Age of Onset   Diabetes Mother    Heart disease Mother        CHF   Hypertension Mother    Kidney disease Mother    Arthritis Mother        OA   Dementia Mother    Cancer Father 11       colon and prostate   Cancer Sister 55       ?cholangiocarcinoma   Cancer Brother 51       lung--smoker  also, prostate CA when 66-54 yo   Kidney disease Son  Stage IV CKD from poorly controlled Htn   Hypertension Son    Cancer Brother 64       prostate   Cancer Brother 27       Prostate--treated successfuly   Cancer Other 57       colon--sister's daughter (sister with "gallbladder duct" cancer)   Cancer Paternal Aunt 8       Breast Cancer   Breast cancer Paternal Aunt    Cancer Paternal Aunt        Bone cancer   Stroke Paternal Grandmother    Prostate cancer Cousin        mat first cousin     Prior to Admission medications   Medication Sig Start Date End Date Taking? Authorizing Provider  amLODipine (NORVASC) 10 MG tablet Take 1 tablet by mouth daily Patient taking differently:  Take 10 mg by mouth daily. 10/02/20  Yes Mack Hook, MD  calcium citrate-vitamin D 500-500 MG-UNIT chewable tablet 1 tab by mouth twice daily 08/11/20  Yes Mack Hook, MD  Fluocinolone Acetonide Scalp (DERMA-SMOOTHE/FS SCALP) 0.01 % OIL Apply to scalp at bedtime as per instructions 08/25/20  Yes Mack Hook, MD  Multiple Vitamin (MULTIVITAMIN) tablet Take 1 tablet by mouth daily.   Yes [provider]  triamcinolone (KENALOG) 0.025 % ointment Apply scant amount to affected areas once daily as needed. Patient taking differently: Apply 1 application topically. Apply 1 application to body once daily as needed. 02/10/19  Orvan July, MD    Physical Exam: Vitals:   04/03/21 1315 04/03/21 1330 04/03/21 1345 04/03/21 1606  BP: 137/90 121/82 117/75 133/80  Pulse: 64 71 73 66  Resp: (!) '21 10 17 18  ' Temp:      TempSrc:      SpO2: 100% 100% 100% 98%  Weight:      Height:        Constitutional: NAD, calm, comfortable Vitals:   04/03/21 1315 04/03/21 1330 04/03/21 1345 04/03/21 1606  BP: 137/90 121/82 117/75 133/80  Pulse: 64 71 73 66  Resp: (!) '21 10 17 18  ' Temp:      TempSrc:      SpO2: 100% 100% 100% 98%  Weight:      Height:       Eyes: lids and conjunctivae normal. Left eye no pupil reflex. ENMT: Mucous membranes are moist. Posterior pharynx clear of any exudate or lesions.Normal dentition.  Neck: normal, supple, no masses, no thyromegaly Respiratory: clear to auscultation bilaterally, no wheezing, no crackles. Normal respiratory effort. No accessory muscle use.  Cardiovascular: Regular rate and rhythm, no murmurs / rubs / gallops. No extremity edema. 2+ pedal pulses. No carotid bruits.  Abdomen: no tenderness, no masses palpated. No hepatosplenomegaly. Bowel sounds positive.  Musculoskeletal: no clubbing / cyanosis. No joint deformity upper and lower extremities. Good ROM, no contractures. Normal muscle tone.  Skin: no rashes, lesions,  ulcers. No induration Neurologic: CN 2-12 grossly intact. Sensation intact, DTR normal. Strength 5/5 in all 4.  Psychiatric: Normal judgment and insight. Alert and oriented x 3. Normal mood.    Labs on Admission: I have personally reviewed following labs and imaging studies  CBC: Recent Labs  Lab 04/03/21 1235  WBC 5.6  NEUTROABS 3.1  HGB 14.2  HCT 41.7  MCV 94.6  PLT 295   Basic Metabolic Panel: Recent Labs  Lab 04/03/21 1235  NA 138  K 3.2*  CL 104  CO2 25  GLUCOSE 113*  BUN <5*  CREATININE 0.40*  CALCIUM 9.7   GFR: Estimated Creatinine Clearance: 68.7 mL/min (A) (by C-G formula based on SCr of 0.4 mg/dL (L)). Liver Function Tests: Recent Labs  Lab 04/03/21 1235  AST 23  ALT 20  ALKPHOS 51  BILITOT 0.5  PROT 8.5*  ALBUMIN 4.1   No results for input(s): LIPASE, AMYLASE in the last 168 hours. No results for input(s): AMMONIA in the last 168 hours. Coagulation Profile: Recent Labs  Lab 04/03/21 1322  INR 1.0   Cardiac Enzymes: No results for input(s): CKTOTAL, CKMB, CKMBINDEX, TROPONINI in the last 168 hours. BNP (last 3 results) No results for input(s): PROBNP in the last 8760 hours. HbA1C: No results for input(s): HGBA1C in the last 72 hours. CBG: No results for input(s): GLUCAP in the last 168 hours. Lipid Profile: No results for input(s): CHOL, HDL, LDLCALC, TRIG, CHOLHDL, LDLDIRECT in the last 72 hours. Thyroid Function Tests: No results for input(s): TSH, T4TOTAL, FREET4, T3FREE, THYROIDAB in the last 72 hours. Anemia Panel: No results for input(s): VITAMINB12, FOLATE, FERRITIN, TIBC, IRON, RETICCTPCT in the last 72 hours. Urine analysis:    Component Value Date/Time   COLORURINE YELLOW 06/17/2017 2125   APPEARANCEUR HAZY (A) 06/17/2017 2125   LABSPEC 1.014 06/17/2017 2125   PHURINE 5.0 06/17/2017 2125   GLUCOSEU NEGATIVE 06/17/2017 2125   HGBUR SMALL (A) 06/17/2017 2125   BILIRUBINUR neg 03/30/2019 1447   KETONESUR NEGATIVE 06/17/2017  2125   PROTEINUR Positive (A) 03/30/2019 1447   PROTEINUR NEGATIVE 06/17/2017 2125   UROBILINOGEN 1.0 03/30/2019 1447   UROBILINOGEN 1.0 10/20/2012 1652   NITRITE positive 03/30/2019 1447   NITRITE NEGATIVE 06/17/2017 2125   LEUKOCYTESUR Large (3+) (A) 03/30/2019 1447    Radiological Exams on Admission: CT ANGIO HEAD NECK W WO CM  Result Date: 04/03/2021 CLINICAL DATA:  Monocular vision loss EXAM: CT ANGIOGRAPHY HEAD AND NECK TECHNIQUE: Multidetector CT imaging of the head and neck was performed using the standard protocol during bolus administration of intravenous contrast. Multiplanar CT image reconstructions and MIPs were obtained to evaluate the vascular anatomy. Carotid stenosis measurements (when applicable) are obtained utilizing NASCET criteria, using the distal internal carotid diameter as the denominator. CONTRAST:  53m OMNIPAQUE IOHEXOL 350 MG/ML SOLN COMPARISON:  Same-day brain MRI FINDINGS: CT HEAD FINDINGS Brain: There is no evidence of acute intracranial hemorrhage, extra-axial fluid collection, or acute infarct. Parenchymal volume is normal. The ventricles are normal in size. Scattered foci of hypodensity in the subcortical and periventricular white matter are noted corresponding to FLAIR signal abnormality seen on the same-day brain MRI. There is no solid mass lesion.  There is no midline shift. Vascular: See below Skull: Normal. Negative for fracture or focal lesion. Sinuses: The imaged paranasal sinuses are clear. Orbits: The globes and orbits are unremarkable, though better assessed on the same day orbital MRI. Review of the MIP images confirms the above findings CTA NECK FINDINGS Aortic arch: Standard branching. Imaged portion shows no evidence of aneurysm or dissection. No significant stenosis of the major arch vessel origins. Right carotid system: There is mild soft and calcified atherosclerotic plaque at the proximal right internal carotid artery without hemodynamically  significant stenosis or occlusion. There is no dissection or aneurysm. The right common and external carotid arteries are patent. Left carotid system: There is mild soft and calcified atherosclerotic plaque at the proximal left internal carotid artery without hemodynamically significant stenosis or occlusion. There is no dissection or aneurysm. The left common and external carotid arteries are patent. Vertebral arteries:  The vertebral arteries are patent, without hemodynamically significant stenosis, occlusion, dissection, or aneurysm. Skeleton: There is no acute osseous abnormality or aggressive osseous lesion. There is no visible canal hematoma. Other neck: There is a subcentimeter left thyroid nodule. The soft tissues are otherwise unremarkable. Upper chest: The imaged lung apices are clear. Review of the MIP images confirms the above findings CTA HEAD FINDINGS Anterior circulation: The cavernous ICAs are patent. The ophthalmic arteries are identified. The bilateral MCAs and ACAs are patent. There is no aneurysm. Posterior circulation: The bilateral V4 segments are patent. The bilateral PCAs are patent. The posterior communicating arteries are not identified. There is no aneurysm. Venous sinuses: As permitted by contrast timing, patent. Anatomic variants: None. Review of the MIP images confirms the above findings IMPRESSION: 1. No acute intracranial pathology. 2. Mild calcified atherosclerotic plaque in the bilateral carotid bulbs without hemodynamically significant stenosis or occlusion. Otherwise, patent vasculature of the head and neck. Electronically Signed   By: Valetta Mole M.D.   On: 04/03/2021 16:04   MR BRAIN W WO CONTRAST  Result Date: 04/03/2021 CLINICAL DATA:  Loss of vision in left eye, suspect optic neuritis versus stroke EXAM: MRI HEAD AND ORBITS WITHOUT AND WITH CONTRAST TECHNIQUE: Multiplanar, multiecho pulse sequences of the brain and surrounding structures were obtained without and with  intravenous contrast. Multiplanar, multiecho pulse sequences of the orbits and surrounding structures were obtained including fat saturation techniques, before and after intravenous contrast administration. CONTRAST:  6.74m GADAVIST GADOBUTROL 1 MMOL/ML IV SOLN COMPARISON:  CT head 04/28/2006 FINDINGS: MRI HEAD FINDINGS Brain: There is no evidence of acute intracranial hemorrhage, extra-axial fluid collection, or acute infarct. There are multiple foci of FLAIR signal abnormality throughout the subcortical and periventricular white matter, nonspecific. There is no associated enhancement or diffusion restriction. The ventricles are normal in size. There is no solid mass lesion. There is no midline shift. Vascular: Normal flow voids. Skull and upper cervical spine: Normal marrow signal. Other: None. MRI ORBITS FINDINGS Orbits: There is mild increased T2 signal in the left optic nerve with associated pain enhancement. There is diffusion restriction along the left optic nerve. There is no significant inflammatory change in the surrounding orbital fat. The left globe is unremarkable. The left extraocular muscles are normal. The right globe, orbit, and optic nerve are normal. Visualized sinuses: The paranasal sinuses are clear. Soft tissues: Unremarkable. IMPRESSION: 1. Abnormal T2 signal and feint enhancement and possible diffusion restriction in the left optic nerve, without significant inflammatory change in the surrounding fat. Findings are suggestive of optic neuritis with differential including optic nerve ischemia. 2. Patchy foci of FLAIR signal abnormality in the subcortical and periventricular white matter are nonspecific. These most commonly reflect sequela of chronic white matter microangiopathy, with differential including demyelination. Electronically Signed   By: PValetta MoleM.D.   On: 04/03/2021 15:33   MR ORBITS W WO CONTRAST  Result Date: 04/03/2021 CLINICAL DATA:  Loss of vision in left eye, suspect  optic neuritis versus stroke EXAM: MRI HEAD AND ORBITS WITHOUT AND WITH CONTRAST TECHNIQUE: Multiplanar, multiecho pulse sequences of the brain and surrounding structures were obtained without and with intravenous contrast. Multiplanar, multiecho pulse sequences of the orbits and surrounding structures were obtained including fat saturation techniques, before and after intravenous contrast administration. CONTRAST:  6.561mGADAVIST GADOBUTROL 1 MMOL/ML IV SOLN COMPARISON:  CT head 04/28/2006 FINDINGS: MRI HEAD FINDINGS Brain: There is no evidence of acute intracranial hemorrhage, extra-axial fluid collection, or acute infarct. There are multiple  foci of FLAIR signal abnormality throughout the subcortical and periventricular white matter, nonspecific. There is no associated enhancement or diffusion restriction. The ventricles are normal in size. There is no solid mass lesion. There is no midline shift. Vascular: Normal flow voids. Skull and upper cervical spine: Normal marrow signal. Other: None. MRI ORBITS FINDINGS Orbits: There is mild increased T2 signal in the left optic nerve with associated pain enhancement. There is diffusion restriction along the left optic nerve. There is no significant inflammatory change in the surrounding orbital fat. The left globe is unremarkable. The left extraocular muscles are normal. The right globe, orbit, and optic nerve are normal. Visualized sinuses: The paranasal sinuses are clear. Soft tissues: Unremarkable. IMPRESSION: 1. Abnormal T2 signal and feint enhancement and possible diffusion restriction in the left optic nerve, without significant inflammatory change in the surrounding fat. Findings are suggestive of optic neuritis with differential including optic nerve ischemia. 2. Patchy foci of FLAIR signal abnormality in the subcortical and periventricular white matter are nonspecific. These most commonly reflect sequela of chronic white matter microangiopathy, with differential  including demyelination. Electronically Signed   By: Valetta Mole M.D.   On: 04/03/2021 15:33    EKG: Independently reviewed.  Sinus, no acute ST changes.  Assessment/Plan Principal Problem:   Optic neuritis  (please populate well all problems here in Problem List. (For example, if patient is on BP meds at home and you resume or decide to hold them, it is a problem that needs to be her. Same for CAD, COPD, HLD and so on)  Acute left eye vision loss -Differential can be stroke, versus GCA vs MS -Neurology consulted, recommend lumbar puncture to rule out MS. -Hold off further treatment for now, start aspirin 81 mg daily -Check A1c and lipid panel. And Echo -CTA, mild bilateral calcified atherosclerotic plaque on bilateral ICA, otherwise no significant hemodynamic compromise. -OT  HTN -Continue Amlodipine.  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  DVT prophylaxis: Lovenox Code Status: Full Code Family Communication: Husband and daughter at bedside Disposition Plan: Expect more than 1 midnight hospital stay Consults called: Neurology Admission status: MedSurg admission with tele monitoring   Lequita Halt MD [facility] Pager [phone]  04/03/2021, 5:10 PM

## 2021-04-03 NOTE — ED Notes (Signed)
Patient transported to MRI 

## 2021-04-03 NOTE — ED Notes (Signed)
Neuro paged to RN Chloe per her request

## 2021-04-04 ENCOUNTER — Inpatient Hospital Stay (HOSPITAL_COMMUNITY): Payer: Self-pay

## 2021-04-04 ENCOUNTER — Inpatient Hospital Stay (HOSPITAL_COMMUNITY): Payer: No Typology Code available for payment source

## 2021-04-04 DIAGNOSIS — I10 Essential (primary) hypertension: Secondary | ICD-10-CM

## 2021-04-04 LAB — ECHOCARDIOGRAM COMPLETE BUBBLE STUDY
AR max vel: 2.12 cm2
AV Area VTI: 2.39 cm2
AV Area mean vel: 2.1 cm2
AV Mean grad: 4 mmHg
AV Peak grad: 8.1 mmHg
Ao pk vel: 1.42 m/s
Area-P 1/2: 4.06 cm2
Calc EF: 55.5 %
S' Lateral: 2.5 cm
Single Plane A2C EF: 53.3 %
Single Plane A4C EF: 57.5 %

## 2021-04-04 LAB — GLUCOSE, CSF: Glucose, CSF: 87 mg/dL — ABNORMAL HIGH (ref 40–70)

## 2021-04-04 LAB — CSF CELL COUNT WITH DIFFERENTIAL
RBC Count, CSF: 6650 /mm3 — ABNORMAL HIGH
Tube #: 3
WBC, CSF: 3 /mm3 (ref 0–5)

## 2021-04-04 LAB — PROTEIN, CSF: Total  Protein, CSF: 135 mg/dL — ABNORMAL HIGH (ref 15–45)

## 2021-04-04 IMAGING — MR MR THORACIC SPINE WO/W CM
5 of 9 series · 19 of 48 positions shown · IV contrast (gadavist)
Comparison: None.

CLINICAL DATA: Acute myelopathy. Rule out CSF leak. Suspect
intracranial hypotension.

EXAM:
MRI THORACIC WITHOUT AND WITH CONTRAST
TECHNIQUE: Multiplanar and multiecho pulse sequences of the thoracic spine were
obtained without and with intravenous contrast.
CONTRAST:  6.5mL GADAVIST GADOBUTROL 1 MMOL/ML IV SOLN

[Series 9: T1 · sagittal · 3.0mm · 0.90mm/px · 4 of 16 slices shown (1 of 3)]
[im 1/16]
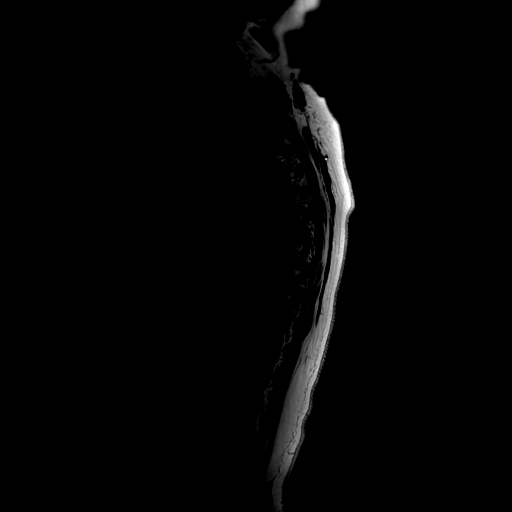
[im 6/16]
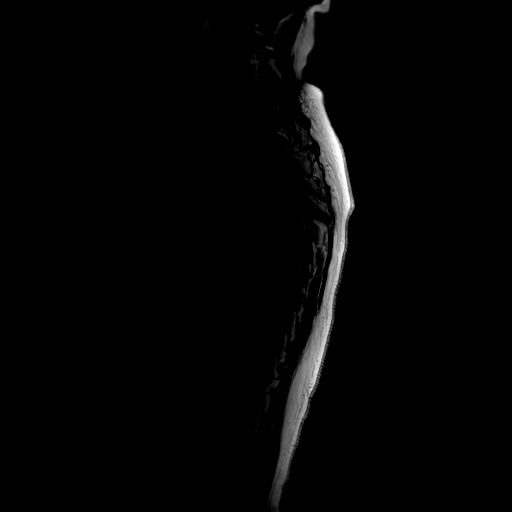
[im 11/16]
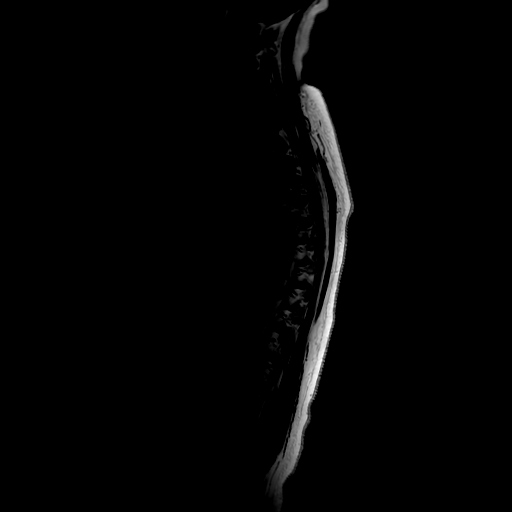
[im 16/16]
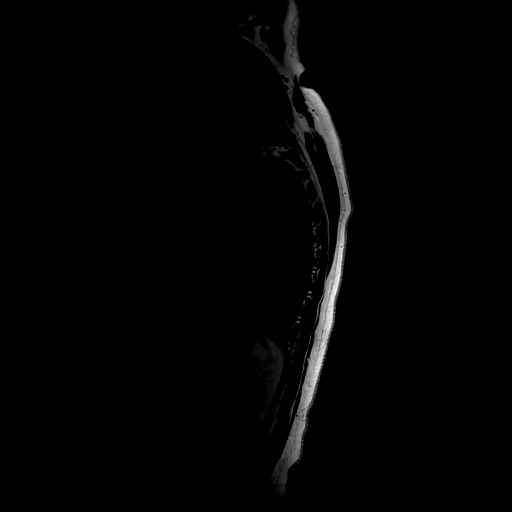

[Series 10: T2 · sagittal · 3.0mm · 0.66mm/px · 3 of 17 slices shown (1 of 2)]
[im 1/17]
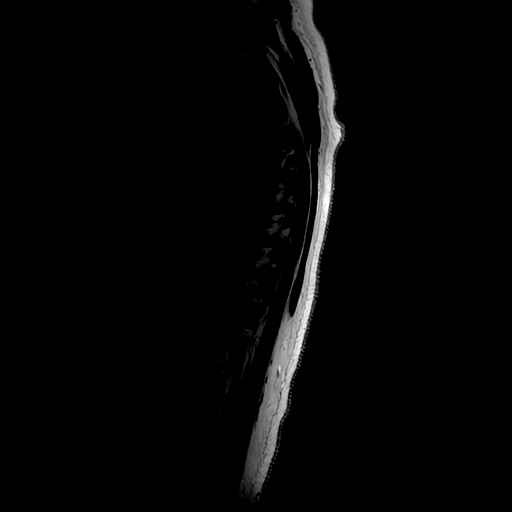
[im 9/17]
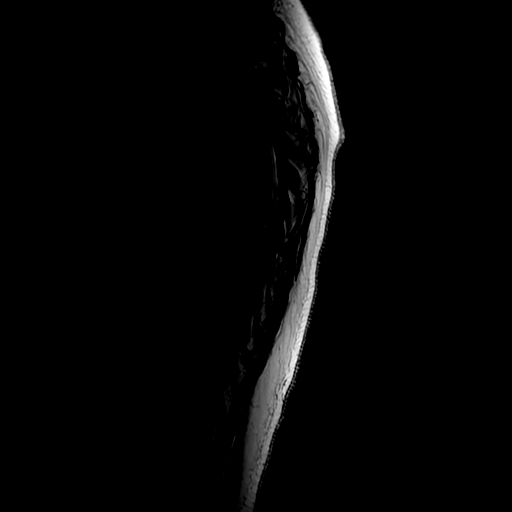
[im 17/17]
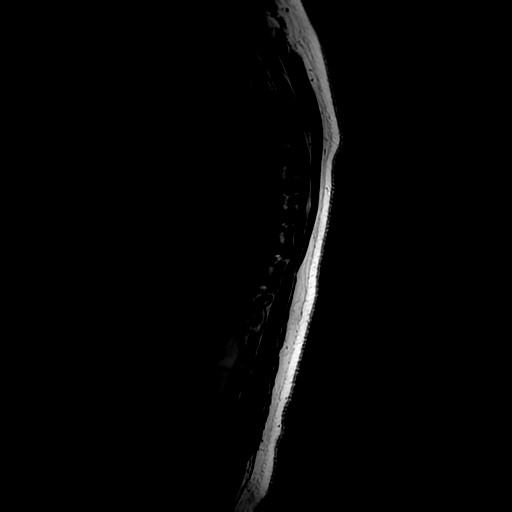

[Series 12: T1 · sagittal · 3.0mm · 0.66mm/px · 3 of 17 slices shown (2 of 3)]
[im 1/17]
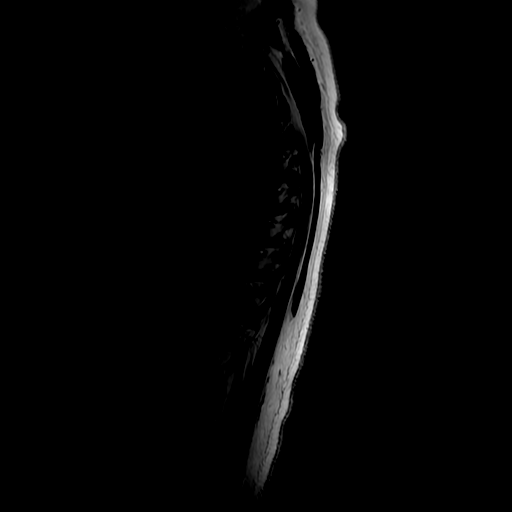
[im 9/17]
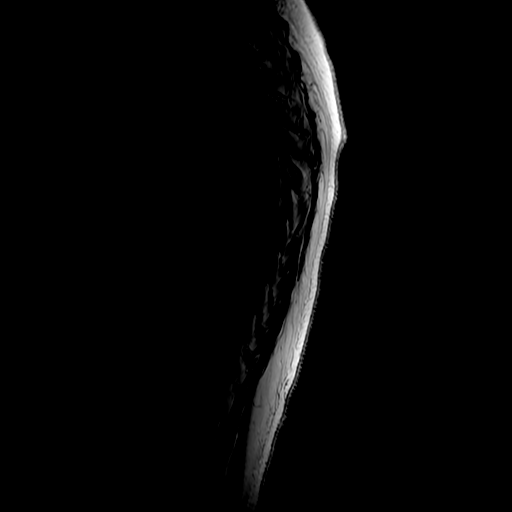
[im 17/17]
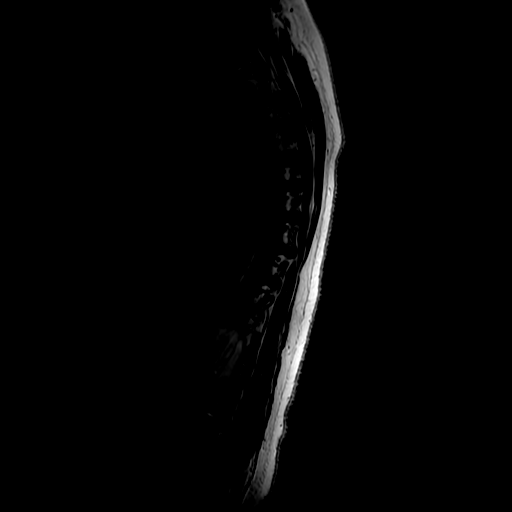

[Series 13: T2 · axial · 4.0mm · 0.39mm/px · z∈[-348,-81]mm · 8 of 50 slices shown (2 of 2)]
[im 1/50]
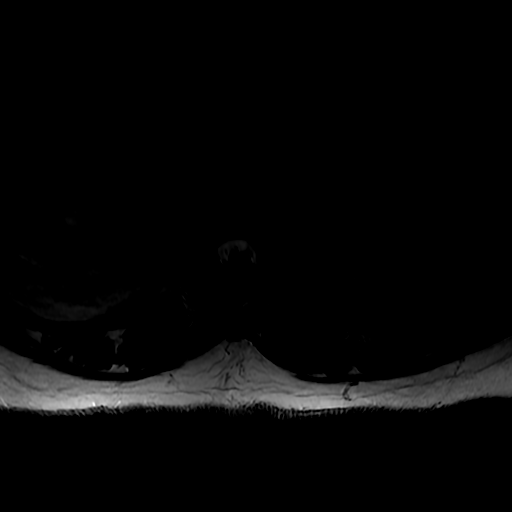
[im 8/50]
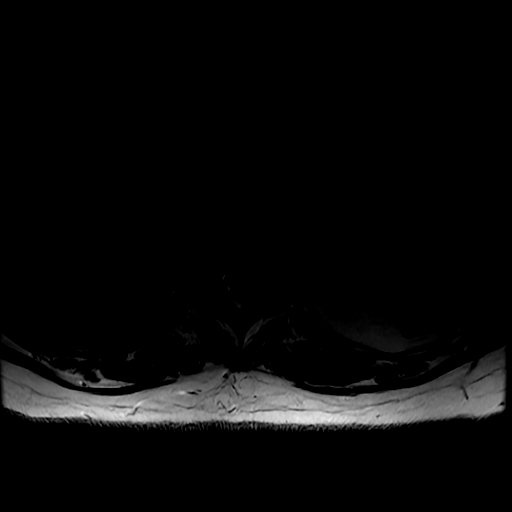
[im 15/50]
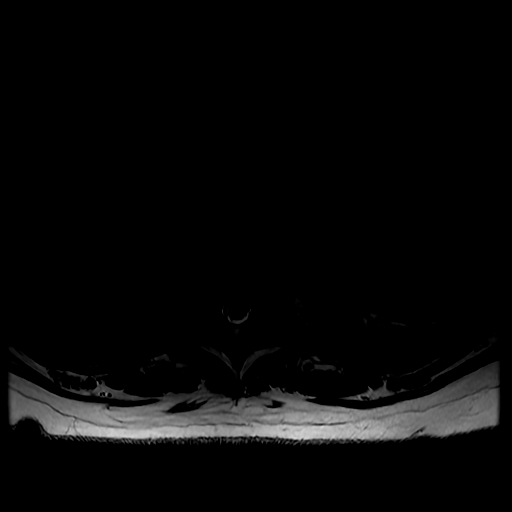
[im 22/50]
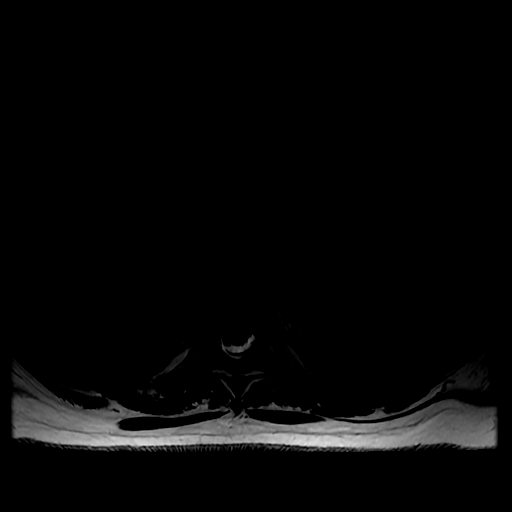
[im 29/50]
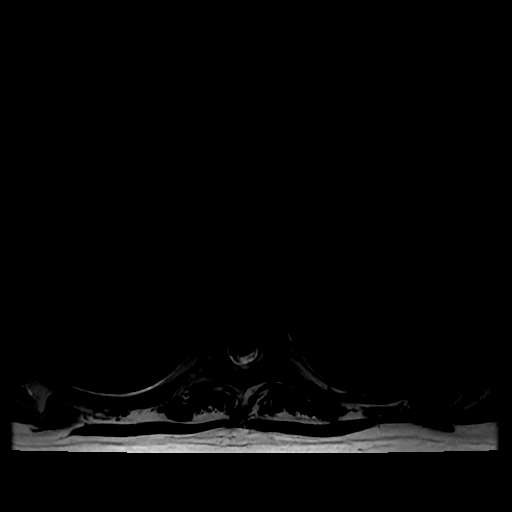
[im 36/50]
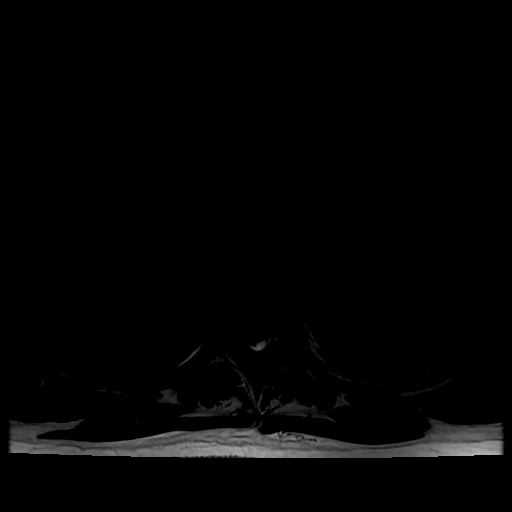
[im 43/50]
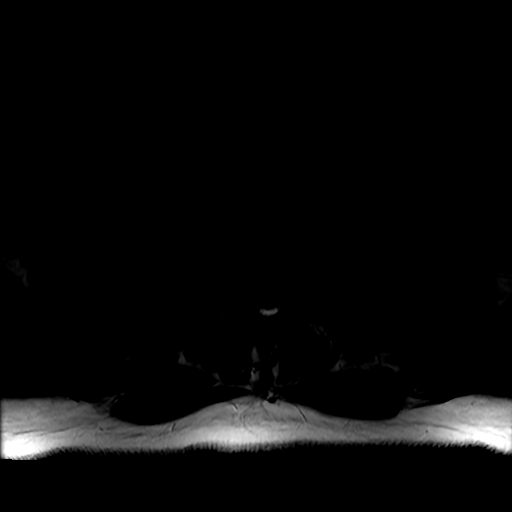
[im 50/50]
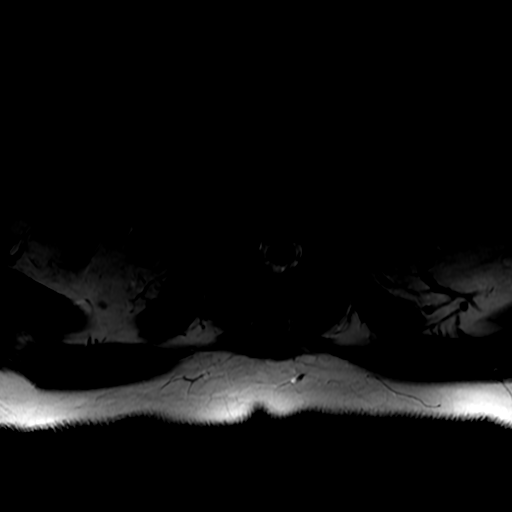

[Series 15: T1 · axial · non-contrast · 4.0mm · 0.39mm/px · 1 of 50 slices shown (3 of 3)]
[im 1/50]
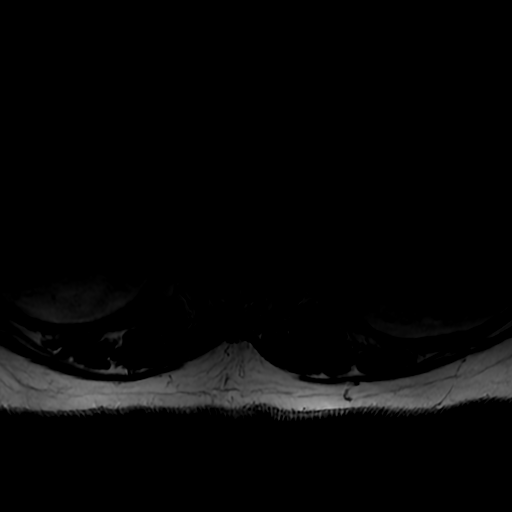

[19 of 48 positions shown; findings below may reference images not displayed]

FINDINGS: Alignment:  Normal

Vertebrae: Negative for fracture or mass

Cord: Cord signal morphology normal. Negative for cord compression.

Paraspinal and other soft tissues: Minimal left pleural effusion. No
fluid collection identified.

Disc levels:

Mild disc degeneration in the thoracic spine with mild disc space
narrowing and minimal spurring. No significant stenosis.
IMPRESSION: Mild thoracic disc degeneration.  No stenosis or cord compression

Negative for paraspinous fluid collection. Minimal left pleural
effusion.

## 2021-04-04 IMAGING — RF DG SPINAL PUNCT LUMBAR DIAG WITH FL CT GUIDANCE
2 series · 2 of 2 positions shown · non-contrast
Comparison: None

CLINICAL DATA: Patient with loss of vision in left eye, normal
pressure hydrocephalus. Referral for diagnostic lumbar puncture.

EXAM:
DIAGNOSTIC LUMBAR PUNCTURE UNDER FLUOROSCOPIC GUIDANCE

[Series 1: fluoro_iodine 2fps_bw · 0.18mm/px · 1 of 1 slices shown]
[im 1/1]
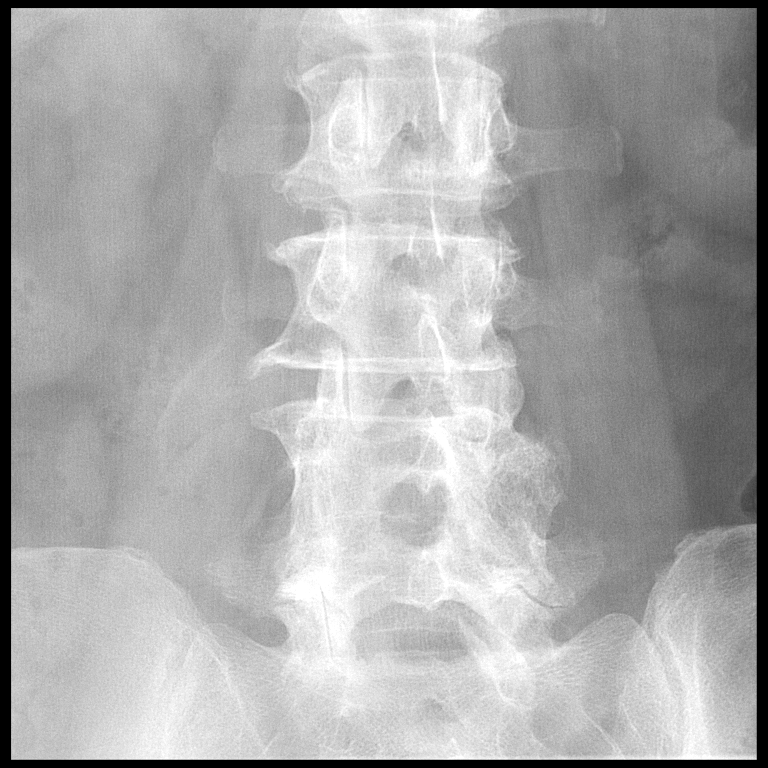

[Series 2: cp_standard · 0.17mm/px · 1 of 1 slices shown]
[im 1/1]
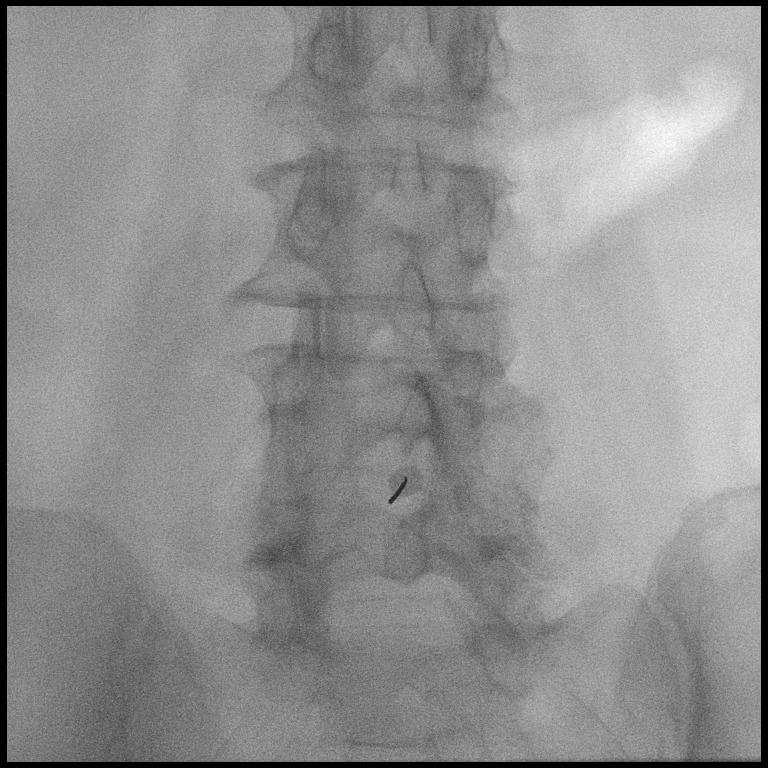

[2 of 2 positions shown; findings below may reference images not displayed]

FLUOROSCOPY TIME:  Fluoroscopy Time:  54 seconds

Radiation Exposure Index (if provided by the fluoroscopic device):
4.8 mGy

Number of Acquired Spot Images: 1

PROCEDURE:
Informed consent was obtained from the patient prior to the
procedure, including potential complications of headache, allergy,
and pain. With the patient prone, the lower back was prepped with
Betadine. 1% Lidocaine was used for local anesthesia. Lumbar
puncture was performed at the L4-5 level using a 18 gauge needle
with return of light pink CSF with an opening pressure of 10 cm
water. 9 ml of CSF were obtained for laboratory studies. The patient
tolerated the procedure well and there were no apparent
complications.
IMPRESSION: Successful lumbar puncture at the L4-5 level under fluoroscopic
guidance.

Read by CAPRAZI

## 2021-04-04 IMAGING — MR MR CERVICAL SPINE WO/W CM
4 of 8 series · 18 of 48 positions shown · IV contrast (gadavist)
Comparison: CTA neck [DATE]

CLINICAL DATA: C-spine CSF leak suspected

EXAM:
MRI CERVICAL SPINE WITHOUT AND WITH CONTRAST
TECHNIQUE: Multiplanar and multiecho pulse sequences of the cervical spine, to
include the craniocervical junction and cervicothoracic junction,
were obtained without and with intravenous contrast.
CONTRAST:  6.5mL GADAVIST GADOBUTROL 1 MMOL/ML IV SOLN

[Series 2: T2 · sagittal · 3.0mm · 0.35mm/px · 3 of 18 slices shown (1 of 2)]
[im 1/18]
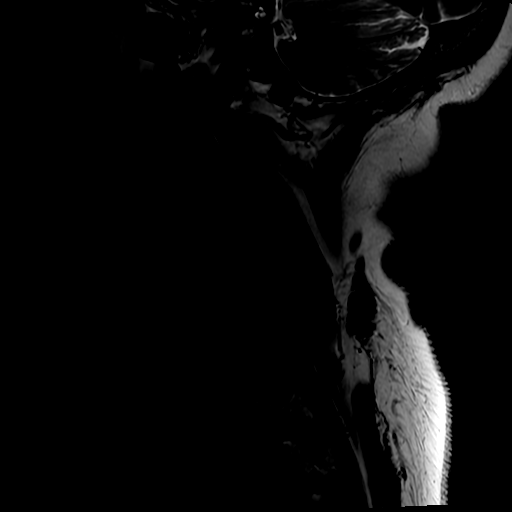
[im 9/18]
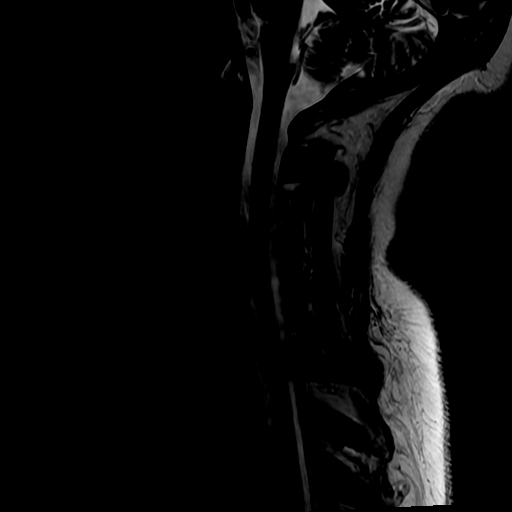
[im 18/18]
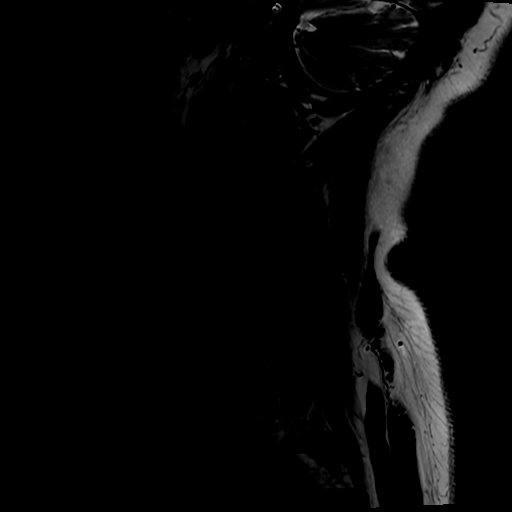

[Series 6: T2 · axial · 3.0mm · 0.35mm/px · z∈[-129,-26]mm · 6 of 35 slices shown (2 of 2)]
[im 1/35]
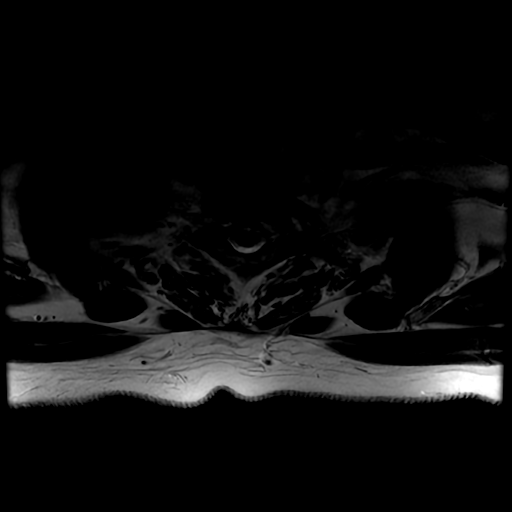
[im 7/35]
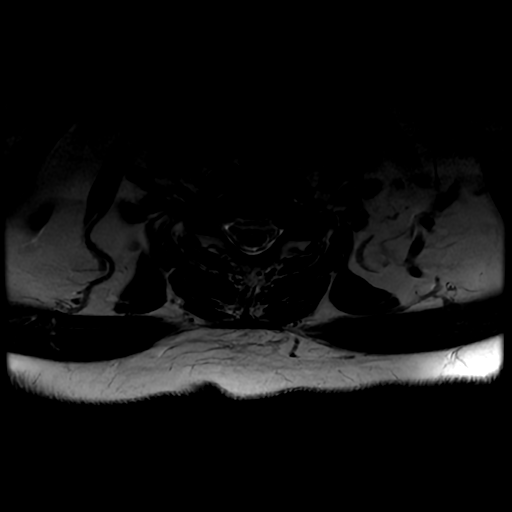
[im 14/35]
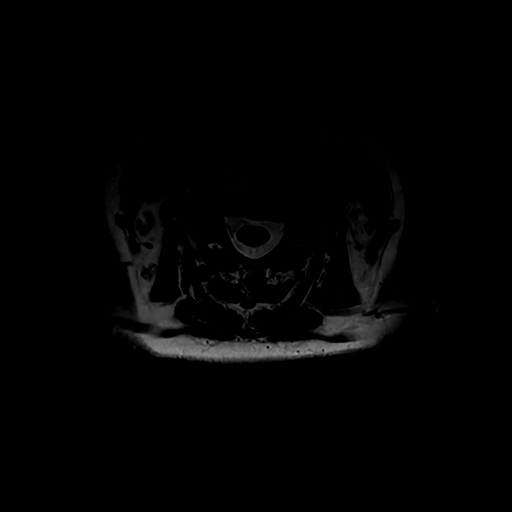
[im 21/35]
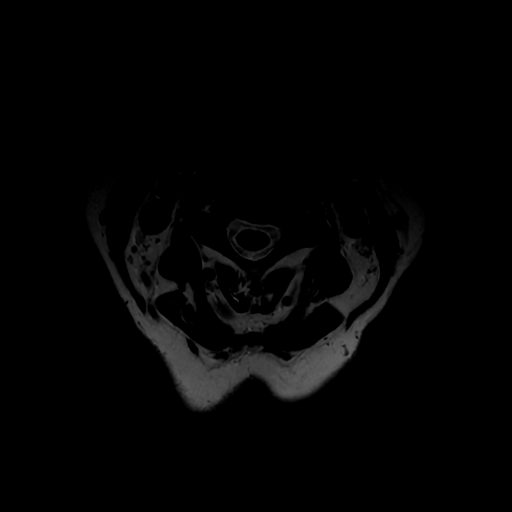
[im 28/35]
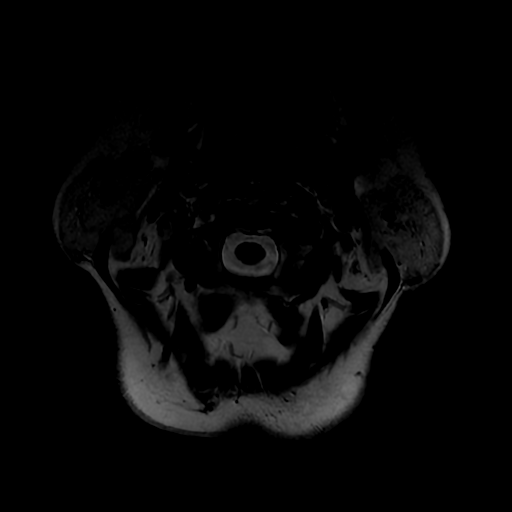
[im 35/35]
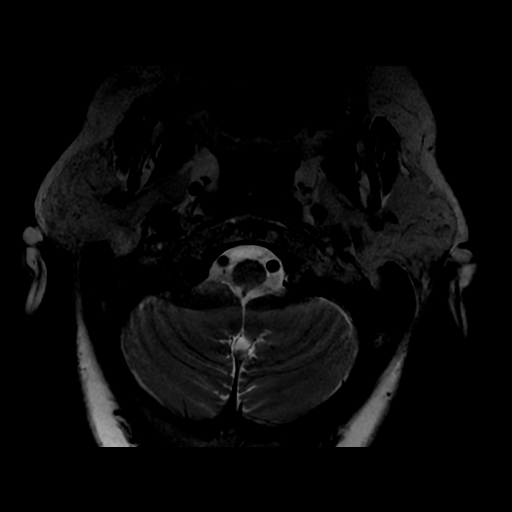

[Series 7: T1 · axial · non-contrast · 3.0mm · 0.35mm/px · z∈[-129,-26]mm · 6 of 35 slices shown]
[im 1/35]
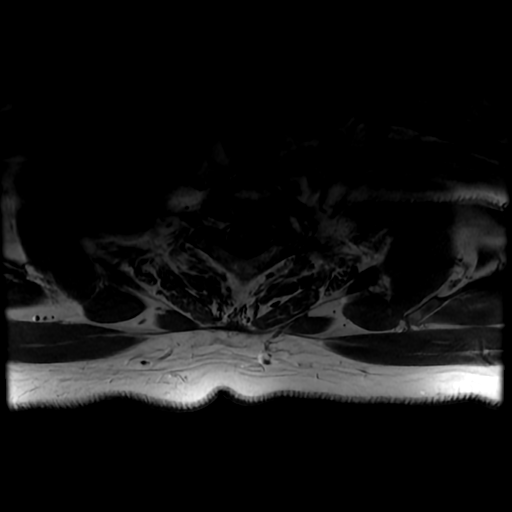
[im 7/35]
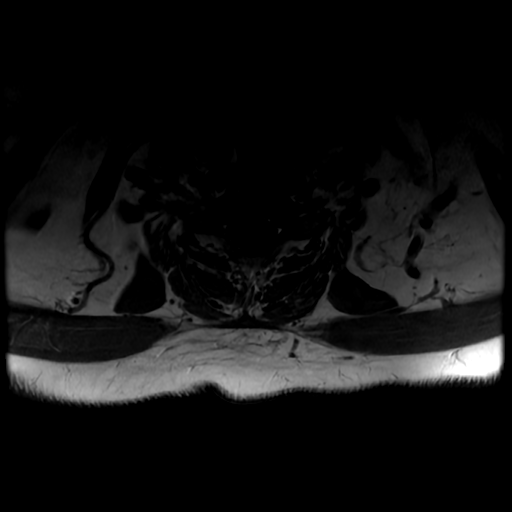
[im 14/35]
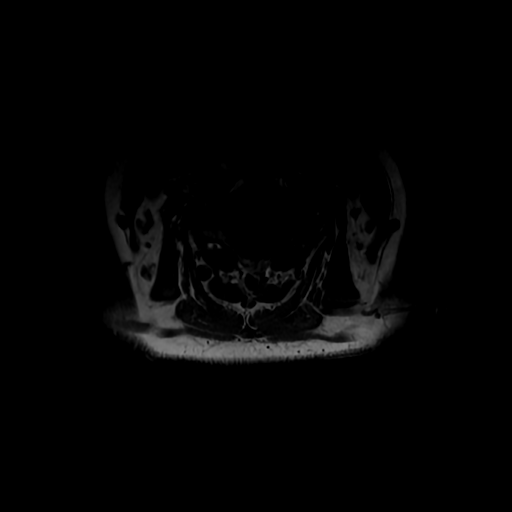
[im 21/35]
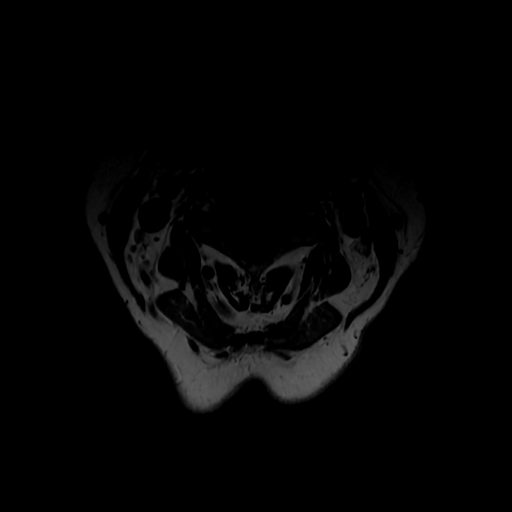
[im 28/35]
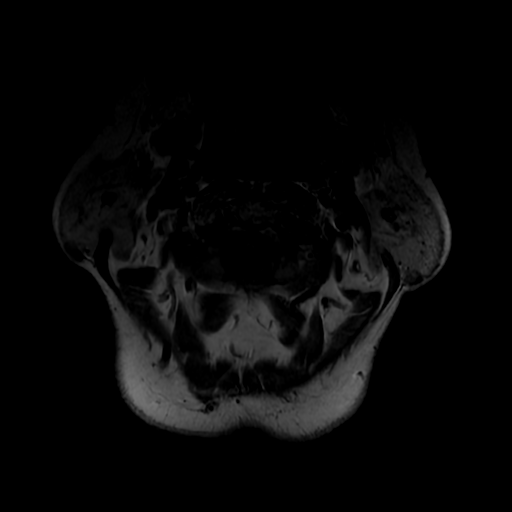
[im 35/35]
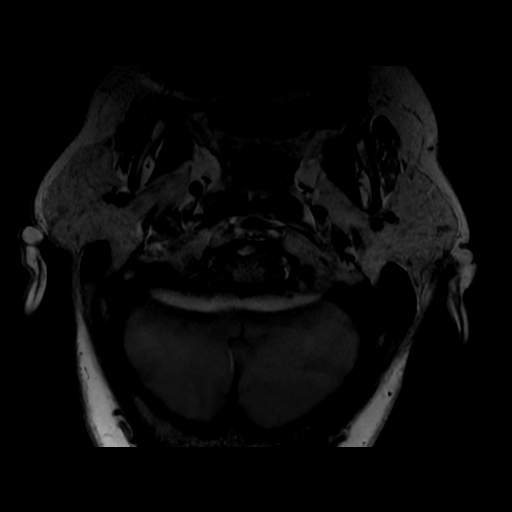

[Series 18: T1 fat-sat post-contrast · sagittal · 3.0mm · 0.35mm/px · 3 of 18 slices shown]
[im 1/18]
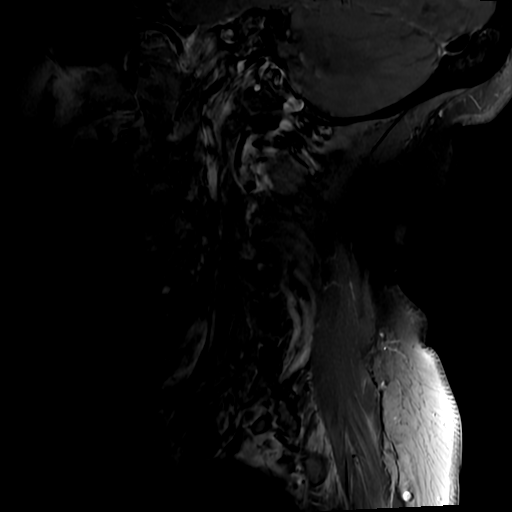
[im 9/18]
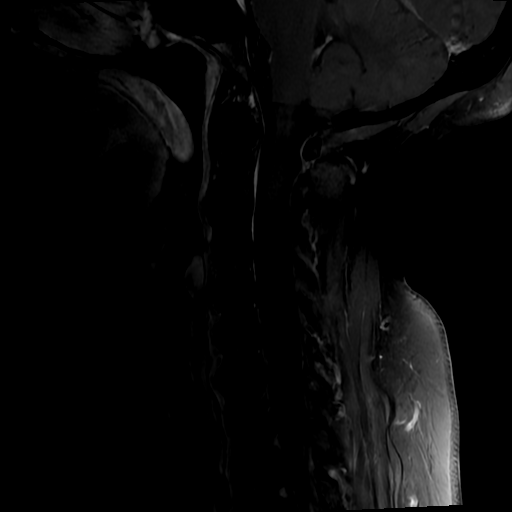
[im 18/18]
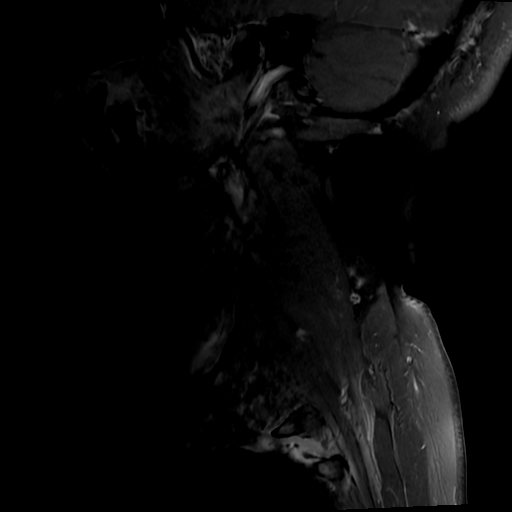

[18 of 48 positions shown; findings below may reference images not displayed]

FINDINGS: Alignment: Normal.

Vertebrae: Vertebral body heights are preserved. There is no marrow
signal abnormality. There is no abnormal marrow enhancement.

Cord/spinal canal: Normal in signal and morphology. There is no
abnormal enhancement in the cord. There is no abnormal fluid
collection in the spinal canal to suggest a source of a CSF leak.

Posterior Fossa, vertebral arteries, paraspinal tissues: The imaged
posterior fossa is unremarkable. The paraspinal soft tissues are
unremarkable. The vertebral artery flow voids are present.

Disc levels:

The disc heights are overall preserved. There is mild facet
arthropathy in the cervical spine with trace fluid on the left at
C2-C3 and C3-C4.

C2-C3: No significant spinal canal or neural foraminal stenosis.

C3-C4: No significant spinal canal or neural foraminal stenosis.

C4-C5: There is mild uncovertebral and facet arthropathy resulting
in mild left and no significant right neural foraminal stenosis and
no significant spinal canal stenosis.

C5-C6: There is mild uncovertebral and facet arthropathy resulting
in mild left and no significant right neural foraminal stenosis and
no significant spinal canal stenosis.

C6-C7: There is a mild posterior disc osteophyte complex and mild
uncovertebral and facet arthropathy resulting in mild left and no
significant right neural foraminal stenosis and no significant
spinal canal stenosis.

C7-T1: No significant spinal canal or neural foraminal stenosis.

Other: There is a subcentimeter left thyroid nodule.
IMPRESSION: 1. No acute findings in the cervical spine. No abnormal fluid
collection to suggest a source of CSF leak.
2. Mild degenerative changes as above without high-grade spinal
canal or neural foraminal stenosis.
3. No evidence of demyelination in the cervical spine.

## 2021-04-04 MED ORDER — GADOBUTROL 1 MMOL/ML IV SOLN
6.5000 mL | Freq: Once | INTRAVENOUS | Status: AC | PRN
  Administered 2021-04-04: 6.5 mL via INTRAVENOUS

## 2021-04-04 MED ORDER — ATORVASTATIN CALCIUM 80 MG PO TABS
80.0000 mg | ORAL_TABLET | Freq: Every day | ORAL | Status: DC
  Administered 2021-04-04 – 2021-04-08 (×5): 80 mg via ORAL
  Filled 2021-04-04 (×5): qty 1

## 2021-04-04 MED ORDER — LIDOCAINE HCL (PF) 1 % IJ SOLN
5.0000 mL | Freq: Once | INTRAMUSCULAR | Status: AC
  Administered 2021-04-04: 5 mL via INTRADERMAL

## 2021-04-04 MED ORDER — ACETAMINOPHEN 325 MG PO TABS
650.0000 mg | ORAL_TABLET | Freq: Four times a day (QID) | ORAL | Status: DC | PRN
  Administered 2021-04-06 – 2021-04-08 (×5): 650 mg via ORAL
  Filled 2021-04-04 (×6): qty 2

## 2021-04-04 NOTE — Consult Note (Addendum)
NEUROLOGY CONSULTATION NOTE   Date of service: April 04, 2021 Patient Name: Jacqueline Fox MRN:  [id number] DOB:  03-06-1956 Reason for consult: L eye vision loss Requesting physician: Dr. Godfrey Pick _ _ _   _ __   _ __ _ _  __ __   _ __   __ _  History of Present Illness   This is a 65 year old woman with a past medical history significant for discoid lupus, hyperlipidemia, and hypertension who presents to the emergency department with left eye vision loss since last Wednesday.  When she woke up that day she had dimming of her vision diffusely without visual field deficits.  Over the next 2 days the vision loss progressed in her left eye only until she lost vision completely 2 days later.  In those 2 days she also developed floaters in the left eye.  She has not recovered any vision.  She does have pain on eye movement with the left eye.  She does not have any visual deficits in her right eye.  She does not have any other focal neurologic deficits.  She denies weakness, numbness, difficulty speaking, difficulty ambulating, or headache.  She denies prodromal symptoms such as jaw claudication, myalgias or arthralgias.  Headache, or fatigue.  She has not diabetic.  She has no prior known history of ischemic cerebrovascular disease.  She was referred to the ED today after she saw ophthalmology and had a retinal study suspicious for optic neuritis versus GCA.  ESR in ED was 45.  MRI brain and orbits with and without contrast was personally reviewed.  She has abnormal T2 flair signal, possible diffusion restriction, and faint contrast enhancement in the left optic nerve consistent with optic neuritis with differential including optic nerve ischemia.  She has patchy nonspecific foci of T2 flair hyperintensity consistent with mild chronic small vessel ischemic disease without discrete parenchymal lesions suggestive of demyelination.  Lumbar puncture was attempted in the ED however insufficient fluid was  obtained to run studies.  Ophthalmology records from outpatient visit are not available for my review.   ROS   Per HPI: all other systems reviewed and are negative  Past History   I have reviewed the following:  Past Medical History:  Diagnosis Date   Discoid lupus    Follows with Derm   Family history of breast cancer    Family history of cholangiocarcinoma    Sister   Family history of colon cancer    Patient has had genetic testing and not found to have genetic predisposition.  Recommendations for cancer screenings as usual per Tamsen Meek her note 2019 please   Family history of lung cancer    Brother   Family history of prostate cancer    Genetic testing 11/27/2017   Negative genetic testing on the Multicancer panel.  The Multi-Gene Panel offered by [facility] includes sequencing and/or deletion duplication testing of the following 84 genes: AIP, ALK, APC, ATM, AXIN2,BAP1,  BARD1, BLM, BMPR1A, BRCA1, BRCA2, BRIP1, CASR, CDC73, CDH1, CDK4, CDKN1B, CDKN1C, CDKN2A (p14ARF), CDKN2A (p16INK4a), CEBPA, CHEK2, CTNNA1, DICER1, DIS3L2, EGFR (c.2369C>T, p.Thr790Met variant   Hyperlipidemia    Hypertension    Past Surgical History:  Procedure Laterality Date   COLONOSCOPY WITH PROPOFOL N/A 02/12/2018   Procedure: COLONOSCOPY WITH PROPOFOL;  Surgeon: Ronald Lobo, MD;  Location: [facility];  Service: Endoscopy;  Laterality: N/A;   Family History  Problem Relation Age of Onset   Diabetes Mother    Heart disease Mother  CHF   Hypertension Mother    Kidney disease Mother    Arthritis Mother        OA   Dementia Mother    Cancer Father 29       colon and prostate   Cancer Sister 61       ?cholangiocarcinoma   Cancer Brother 48       lung--smoker  also, prostate CA when 33-54 yo   Kidney disease Son        Stage IV CKD from poorly controlled Htn   Hypertension Son    Cancer Brother 68       prostate   Cancer Brother 76       Prostate--treated successfuly    Cancer Other 84       colon--sister's daughter (sister with "gallbladder duct" cancer)   Cancer Paternal Aunt 4       Breast Cancer   Breast cancer Paternal Aunt    Cancer Paternal Aunt        Bone cancer   Stroke Paternal Grandmother    Prostate cancer Cousin        mat first cousin   Social History   Socioeconomic History   Marital status: Soil scientist    Spouse name: Serita Kyle   Number of children: 4   Years of education: 14   Highest education level: Associate degree: academic program  Occupational History   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  Vaping Use   Vaping Use: Never used  Substance and Sexual Activity   Alcohol use: Yes    Comment: Bottle of wine over 3 day weekend.     Drug use: Yes    Types: Marijuana    Comment: edibles and smoking.   Sexual activity: Not Currently  Other Topics Concern   Not on file  Social History Narrative   Has been together with female partner for 28 years (2022).   He does not smoke.     Currently just lives with her partner, [place]   Social Determinants of Health   Financial Resource Strain: Low Risk    Difficulty of Paying Living Expenses: Not hard at all  Food Insecurity: No Food Insecurity   Worried About Charity fundraiser in the Last Year: Never true   Arboriculturist in the Last Year: Never true  Transportation Needs: No Transportation Needs   Lack of Transportation (Medical): No   Lack of Transportation (Non-Medical): No  Physical Activity: Not on file  Stress: Not on file  Social Connections: Not on file   No Known Allergies  Medications   Medications Prior to Admission  Medication Sig Dispense Refill Last Dose   amLODipine  (NORVASC) 10 MG tablet Take 1 tablet by mouth daily (Patient taking differently: Take 10 mg by mouth daily.) 30 tablet 11 04/02/2021   calcium citrate-vitamin D 500-500 MG-UNIT chewable tablet 1 tab by mouth twice daily   04/02/2021   Fluocinolone Acetonide Scalp (DERMA-SMOOTHE/FS SCALP) 0.01 % OIL Apply to scalp at bedtime as per instructions 118 mL 4 04/03/2021   Multiple Vitamin (MULTIVITAMIN) tablet Take 1 tablet  by mouth daily.   04/02/2021   triamcinolone (KENALOG) 0.025 % ointment Apply scant amount to affected areas once daily as needed. (Patient taking differently: Apply 1 application topically. Apply 1 application to body once daily as needed.) 80 g 2 04/02/2021      Current Facility-Administered Medications:    amLODipine (NORVASC) tablet 10 mg, 10 mg, Oral, Daily, Wynetta Fines T, MD, 10 mg at 04/03/21 2052   aspirin EC tablet 81 mg, 81 mg, Oral, Daily, Wynetta Fines T, MD, 81 mg at 04/03/21 2052   calcium-vitamin D (OSCAL WITH D) 500-5 MG-MCG per tablet 1 tablet, 1 tablet, Oral, Daily, Wynetta Fines T, MD, 1 tablet at 04/03/21 2052   enoxaparin (LOVENOX) injection 40 mg, 40 mg, Subcutaneous, Q24H, Wynetta Fines T, MD, 40 mg at 04/03/21 2107   iohexol (OMNIPAQUE) 350 MG/ML injection 60 mL, 60 mL, Intravenous, Once PRN, Derek Jack, MD   methylPREDNISolone sodium succinate (SOLU-MEDROL) 1,000 mg in sodium chloride 0.9 % 50 mL IVPB, 1,000 mg, Intravenous, Q24H, Last Rate: 66 mL/hr at 04/03/21 2214, 1,000 mg at 04/03/21 2214 **AND** pantoprazole (PROTONIX) injection 40 mg, 40 mg, Intravenous, Q24H, Derek Jack, MD, 40 mg at 04/03/21 2053   nicotine (NICODERM CQ - dosed in mg/24 hr) patch 7 mg, 7 mg, Transdermal, Daily, Derek Jack, MD, 7 mg at 04/03/21 2053   triamcinolone (KENALOG) 2.703 % cream 1 application, 1 application, Topical, TID, Wynetta Fines T, MD  Vitals   Vitals:   04/03/21 1721 04/03/21 1903 04/03/21 2010 04/04/21 0637  BP:  (!) 142/71 113/71 122/79  Pulse: 73 68  64 89  Resp: 19 16    Temp:  98.2 F (36.8 C) 97.7 F (36.5 C) 98 F (36.7 C)  TempSrc:  Oral Oral Oral  SpO2: 98% 100% 100% 99%  Weight:      Height:         Body mass index is 25.4 kg/m.  Physical Exam   Physical Exam Gen: A&O x4, NAD HEENT: Atraumatic, normocephalic;mucous membranes moist; oropharynx clear, tongue without atrophy or fasciculations. Neck: Supple, trachea midline. Resp: CTAB, no w/r/r CV: RRR, no m/g/r; nml S1 and S2. 2+ symmetric peripheral pulses. Abd: soft/NT/ND; nabs x 4 quad Extrem: Nml bulk; no cyanosis, clubbing, or edema.  Neuro: *MS: A&O x4. Follows multi-step commands.  *Speech: fluid, nondysarthric, able to name and repeat *CN:    I: Deferred   II,III: PERRLA, VFF by confrontation on R, no vision at all in L eye, optic discs unable to be visualized 2/2 pupillary constriction   III,IV,VI: EOMI w/o nystagmus, +pain on eye movement in L eye only, no ptosis   V: Sensation intact from V1 to V3 to LT   VII: Eyelid closure was full.  Smile symmetric.   VIII: Hearing intact to voice   IX,X: Voice normal, palate elevates symmetrically    XI: SCM/trap 5/5 bilat   XII: Tongue protrudes midline, no atrophy or fasciculations   *Motor:   Normal bulk.  No tremor, rigidity or bradykinesia. No pronator drift.    Strength: Dlt Bic Tri WrE WrF FgS Gr HF KnF KnE PlF DoF    Left _0 Right _1 *Sensory: Intact to light touch, pinprick, temperature vibration throughout. Symmetric. Propioception intact bilat.  No double-simultaneous extinction.  *Coordination:  Finger-to-nose, heel-to-shin, rapid alternating motions  were intact. *Reflexes:  2+ and symmetric throughout without clonus; toes down-going bilat *Gait: deferred    Labs   CBC:  Recent Labs  Lab 04/03/21 1235  WBC 5.6  NEUTROABS 3.1  HGB 14.2  HCT 41.7  MCV 94.6  PLT 505    Basic Metabolic Panel:  Lab Results  Component Value Date   NA  138 04/03/2021   K 3.2 (L) 04/03/2021   CO2 25 04/03/2021   GLUCOSE 113 (H) 04/03/2021   BUN <5 (L) 04/03/2021   CREATININE 0.40 (L) 04/03/2021   CALCIUM 9.7 04/03/2021   GFRNONAA >60 04/03/2021   GFRAA 117 02/21/2020   Lipid Panel:  Lab Results  Component Value Date   LDLCALC 153 (H) 04/03/2021   HgbA1c:  Lab Results  Component Value Date   HGBA1C 5.2 04/03/2021   Urine Drug Screen:     Component Value Date/Time   LABOPIA NONE DETECTED 04/03/2021 1322   COCAINSCRNUR NONE DETECTED 04/03/2021 1322   LABBENZ NONE DETECTED 04/03/2021 1322   AMPHETMU NONE DETECTED 04/03/2021 1322   THCU POSITIVE (A) 04/03/2021 1322   LABBARB NONE DETECTED 04/03/2021 1322    Alcohol Level No results found for: ETH   Impression   This is a 65 year old woman with a past medical history significant for discoid lupus, hyperlipidemia, and hypertension who presents to the emergency department with left eye vision loss and pain on L eye movement since last Wednesday.  Differential diagnosis includes optic neuritis, ischemic optic neuropathy, and giant cell arteritis.  It is not clear at this point from history and examination nor imaging what the etiology is.  Optic neuritis is more common in patients under 40 although pain on eye movement in the left does favor inflammatory etiology.  There is faint contrast enhancement of the left optic nerve which is more common in optic neuritis however it can occur in ischemic optic neuropathy and there is questionable restriction diffusion as well.  Ischemic optic neuropathy would be the more common in her age group however the only significant risk factor that she has from a vascular standpoint is uncontrolled hyperlipidemia (LDL 153).  She has a diagnosis of hypertension but this is well controlled and she is not diabetic.  GCA is also in the differential but she denies prodromal symptoms.  Her ESR was elevated at 45 but she does have a diagnosis of lupus.  There are  no clear lesions in the brain parenchyma consistent with demyelination.  I recommended lumbar puncture to check oligoclonal bands and IgG index as evidence of CNS inflammation would favor optic neuritis however insufficient fluid was obtained in the ED to run any tests.  She is scheduled for IR LP in the morning.  In the meantime I will start her empirically on high-dose steroids.  She should have an ophthalmology consult in the morning as appearance of the optic disc may refine the differential diagnosis and she may require temporal artery biopsy if they are concerned for GCA.  Recommendations   - IR LP in AM to include cell count w/ diff x2 tubes, protein, glucose, culture, IgG index, CSF + serum oligoclonal bands - MRI c and t spine wwo contrast 24 hrs after last gad administration to look for e/o demyelination in spinal cord - Empiric high dose solumedrol 1051m q 24 hrs x 5 days - Ophthalmology consult - Atorvastatin 863mdaily - Aspirin 8114maily  Will continue to follow. ____________________________________________________   Thank you for the opportunity to take  part in the care of this patient. If you have any further questions, please contact the neurology consultation attending.  Signed,  Su Monks, MD [facility] [phone]  If 7pm- 7am, please page neurology on call as listed in [place].

## 2021-04-04 NOTE — Progress Notes (Signed)
OT Cancellation Note  Patient Details Name: GUYNELL KLEIBER MRN: [id number] DOB: 06-03-1955   Cancelled Treatment:    Reason Eval/Treat Not Completed: Patient at procedure or test/ unavailable. Pt receiving lumbar puncture this morning. Will follow up with pt as schedule allows.  Alfonzo Beers, OTD, OTR/L Acute Rehab [phone]   Mayer Masker 04/04/2021, 8:36 AM

## 2021-04-04 NOTE — Evaluation (Addendum)
Occupational Therapy Evaluation Patient Details Name: Jacqueline Fox MRN: [id number] DOB: 04-Aug-1955 Today's Date: 04/04/2021   History of Present Illness Pt is 65 y.o. female presenting to ED on 11/22 with loss of vision in L eye. PMH includes lupus, hyperlipidemia and HTN   Clinical Impression   Pt is independent at baseline with ADLs and functional mobility, lives with friend who is available 24/7 for assistance at d/c. Pt independent with bed mobility, transfers, and ambulation during session, able to reach outside base of support and maintain balance without difficulty. Pt Mod I with ADLs, expressed concerns regarding depth perception and occasional difficulty pouring liquid. Observed pt pour soda can into cup independently without difficulty or spillage. Educated pt on home modifications and compensatory strategies to use for low vision, handout provided. Pt verbalized and demonstrated understanding. Pt near baseline, limited by decreased coordination and vision, will continue to follow acutely. Recommend d/c home with assistance.      Recommendations for follow up therapy are one component of a multi-disciplinary discharge planning process, led by the attending physician.  Recommendations may be updated based on patient status, additional functional criteria and insurance authorization.   Follow Up Recommendations  No OT follow up    Assistance Recommended at Discharge Set up Supervision/Assistance  Functional Status Assessment  Patient has had a recent decline in their functional status and demonstrates the ability to make significant improvements in function in a reasonable and predictable amount of time.  Equipment Recommendations  None recommended by OT;Other (comment) (Pt has all DME at home)    Recommendations for Other Services       Precautions / Restrictions Precautions Precautions: None Restrictions Weight Bearing Restrictions: No      Mobility Bed  Mobility Overal bed mobility: Independent                  Transfers Overall transfer level: Independent                        Balance Overall balance assessment: No apparent balance deficits (not formally assessed)                                         ADL either performed or assessed with clinical judgement   ADL Overall ADL's : Modified independent                                       General ADL Comments: Pt expressed occasional difficulty with pouring, observed pt independently pour soda into cup without spillage.     Vision Baseline Vision/History: 1 Wears glasses Ability to See in Adequate Light: 2 Moderately impaired Patient Visual Report:  (Cannot see out of L eye, pt denies seeing any spotting or blurred figures. States she can tell darkness/lighteness when covering L eye with hand) Vision Assessment?: Vision impaired- to be further tested in functional context     Perception     Praxis      Pertinent Vitals/Pain       Hand Dominance     Extremity/Trunk Assessment             Communication Communication Communication: No difficulties   Cognition Arousal/Alertness: Awake/alert Behavior During Therapy: WFL for tasks assessed/performed Overall Cognitive Status: Within Functional Limits for tasks assessed  General Comments  Provided/educated pt on compensatory strategies and home modifications to make for low vision. Pt verbalized understanding, states she is already doing most of these things. Pt notes mild difficulty with depth perception, denies over/undershooting when reaching for objects, denies difficulty walking.    Exercises     Shoulder Instructions      Home Living Family/patient expects to be discharged to:: Private residence Living Arrangements: Spouse/significant other Available Help at Discharge: Friend(s);Other (Comment) (friend  is retired, able to help 24/7 at d/c) Type of Home: House Home Access: Ramped entrance     Home Layout: One level     Bathroom Shower/Tub: Producer, television/film/video: Standard Bathroom Accessibility: Yes   Home Equipment: Agricultural consultant (2 wheels);Shower seat          Prior Functioning/Environment Prior Level of Function : Independent/Modified Independent                        OT Problem List: Decreased coordination;Impaired vision/perception      OT Treatment/Interventions:      OT Goals(Current goals can be found in the care plan section) Acute Rehab OT Goals Patient Stated Goal: adapt to change in vision OT Goal Formulation: With patient Time For Goal Achievement: 04/18/21 Potential to Achieve Goals: Good ADL Goals Pt Will Perform Upper Body Dressing: Independently;standing;sitting Pt Will Perform Lower Body Dressing: Independently;sit to/from stand Pt Will Transfer to Toilet: Independently;regular height toilet;ambulating Additional ADL Goal #1: Pt will perform visual motor task with minimal error and supervision prior to d/c.  OT Frequency:     Barriers to D/C:            Co-evaluation              AM-PAC OT "6 Clicks" Daily Activity     Outcome Measure Help from another person eating meals?: None Help from another person taking care of personal grooming?: None Help from another person toileting, which includes using toliet, bedpan, or urinal?: None Help from another person bathing (including washing, rinsing, drying)?: A Little Help from another person to put on and taking off regular upper body clothing?: None Help from another person to put on and taking off regular lower body clothing?: A Little 6 Click Score: 22   End of Session Nurse Communication: Mobility status;Other (comment) (confirmed bedrest order was lifted prior to evaluating pt)  Activity Tolerance: Patient tolerated treatment well Patient left: in bed;with call  bell/phone within reach (sitting EOB)  OT Visit Diagnosis: Unsteadiness on feet (R26.81);Other abnormalities of gait and mobility (R26.89);Other (comment) (low vision, one eye)                Time: 6226-3335 OT Time Calculation (min): 15 min Charges:  OT General Charges $OT Visit: 1 Visit OT Evaluation $OT Eval Low Complexity: 1 Low  Alfonzo Beers, OTD, OTR/L Acute Rehab [phone]) [phone]   Mayer Masker 04/04/2021, 3:37 PM

## 2021-04-04 NOTE — Procedures (Signed)
Technically successful fluoro guided LP with opening pressure of 10 mmHg @ L4-5 yielding 9 cc of light pink CSF sent to lab for analysis.  No immediate post procedural complication  Lynnette Caffey, PA-C

## 2021-04-04 NOTE — Progress Notes (Signed)
PROGRESS NOTE    JAILEE Fox  WUJ:[id number] DOB: [birth year] DOA: 04/03/2021 PCP: Julieanne Manson, MD  Brief Narrative:Jacqueline Fox is a 65 y.o. 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   Her symptoms started last Wednesday, woke up with floaters in the left eye, and soreness when moving left eye, no fever or chills and denies any numbness or weakness of any of the limbs.  Her right eye vision has been intact and no floaters.  Symptoms of left eye floaters continue on Thursday and on Friday she woke up with total loss of vision on the left eye.  She continues to experience soreness of the left eye but no pain.  Subsequently seen by retinal specialist, exam was suspicious for GCA and or optic neuritis sent to ED. 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  DVT prophylaxis: Lovenox Code Status: Full code Family Communication: Discussed patient in detail, no family at bedside Disposition Plan:  Status is: Inpatient  Remains inpatient appropriate because: Severity of illness, presumed optic neuritis requiring high-dose IV steroids  Consultants:  Neuro  Procedures:   Antimicrobials:    Subjective: -feels ok, L eye blindness  Objective: Vitals:   04/03/21 1903 04/03/21 2010 04/04/21 0637 04/04/21 0952  BP: (!) 142/71 113/71 122/79 116/84  Pulse: 68 64 89 100  Resp: 16   17  Temp: 98.2 F  (36.8 C) 97.7 F (36.5 C) 98 F (36.7 C) 98.7 F (37.1 C)  TempSrc: Oral Oral Oral Oral  SpO2: 100% 100% 99% 100%  Weight:      Height:       No intake or output data in the 24 hours ending 04/04/21 1550 Filed Weights   04/03/21 1228  Weight: 67.1 kg    Examination:  General exam: Appears calm and comfortable  Respiratory system: Clear to auscultation Cardiovascular system: S1 & S2 heard, RRR Abd: nondistended, soft and nontender.Normal bowel sounds heard. Central nervous system: Alert and oriented. No focal neurological deficits. Extremities: no edema Skin: No rashes Psychiatry: Judgement and insight appear normal. Mood & affect appropriate.     Data Reviewed:   CBC: Recent Labs  Lab 04/03/21 1235  WBC 5.6  NEUTROABS 3.1  HGB 14.2  HCT 41.7  MCV 94.6  PLT 285   Basic Metabolic Panel: Recent Labs  Lab 04/03/21 1235  NA 138  K 3.2*  CL 104  CO2 25  GLUCOSE 113*  BUN <5*  CREATININE 0.40*  CALCIUM 9.7   GFR: Estimated Creatinine Clearance: 68.7 mL/min (A) (by C-G formula based on SCr of 0.4 mg/dL (L)). Liver Function Tests: Recent Labs  Lab 04/03/21 1235  AST 23  ALT 20  ALKPHOS 51  BILITOT 0.5  PROT 8.5*  ALBUMIN 4.1   No results for input(s): LIPASE, AMYLASE in the last 168 hours. No results for input(s): AMMONIA in the last 168 hours. Coagulation Profile: Recent Labs  Lab 04/03/21 1322  INR 1.0   Cardiac Enzymes: No results  for input(s): CKTOTAL, CKMB, CKMBINDEX, TROPONINI in the last 168 hours. BNP (last 3 results) No results for input(s): PROBNP in the last 8760 hours. HbA1C: Recent Labs    04/03/21 1930  HGBA1C 5.2   CBG: No results for input(s): GLUCAP in the last 168 hours. Lipid Profile: Recent Labs    04/03/21 1930  CHOL 250*  HDL 70  LDLCALC 153*  TRIG 133  CHOLHDL 3.6  LDLDIRECT 158.6*   Thyroid Function Tests: No results for input(s): TSH, T4TOTAL, FREET4, T3FREE, THYROIDAB in the last 72 hours. Anemia  Panel: No results for input(s): VITAMINB12, FOLATE, FERRITIN, TIBC, IRON, RETICCTPCT in the last 72 hours. Urine analysis:    Component Value Date/Time   COLORURINE YELLOW 04/03/2021 1322   APPEARANCEUR CLEAR 04/03/2021 1322   LABSPEC 1.044 (H) 04/03/2021 1322   PHURINE 7.0 04/03/2021 1322   GLUCOSEU NEGATIVE 04/03/2021 1322   HGBUR SMALL (A) 04/03/2021 1322   BILIRUBINUR NEGATIVE 04/03/2021 1322   BILIRUBINUR neg 03/30/2019 1447   KETONESUR 20 (A) 04/03/2021 1322   PROTEINUR NEGATIVE 04/03/2021 1322   UROBILINOGEN 1.0 03/30/2019 1447   UROBILINOGEN 1.0 10/20/2012 1652   NITRITE POSITIVE (A) 04/03/2021 1322   LEUKOCYTESUR TRACE (A) 04/03/2021 1322   Sepsis Labs: @LABRCNTIP (procalcitonin:4,lacticidven:4)  ) Recent Results (from the past 240 hour(s))  Resp Panel by RT-PCR (Flu A&B, Covid) Nasopharyngeal Swab     Status: None   Collection Time: 04/03/21  4:01 PM   Specimen: Nasopharyngeal Swab; Nasopharyngeal(NP) swabs in vial transport medium  Result Value Ref Range Status   SARS Coronavirus 2 by RT PCR NEGATIVE NEGATIVE Final    Comment: (NOTE) SARS-CoV-2 target nucleic acids are NOT DETECTED.  The SARS-CoV-2 RNA is generally detectable in upper respiratory specimens during the acute phase of infection. The lowest concentration of SARS-CoV-2 viral copies this assay can detect is 138 copies/mL. A negative result does not preclude SARS-Cov-2 infection and should not be used as the sole basis for treatment or other patient management decisions. A negative result may occur with  improper specimen collection/handling, submission of specimen other than nasopharyngeal swab, presence of viral mutation(s) within the areas targeted by this assay, and inadequate number of viral copies(<138 copies/mL). A negative result must be combined with clinical observations, patient history, and epidemiological information. The expected result is Negative.  Fact Sheet for Patients:   04/05/21  Fact Sheet for Healthcare Providers:  BloggerCourse.com  This test is no t yet approved or cleared by the [employer] FDA and  has been authorized for detection and/or diagnosis of SARS-CoV-2 by FDA under an Emergency Use Authorization (EUA). This EUA will remain  in effect (meaning this test can be used) for the duration of the COVID-19 declaration under Section 564(b)(1) of the Act, 21 U.S.C.section 360bbb-3(b)(1), unless the authorization is terminated  or revoked sooner.       Influenza A by PCR NEGATIVE NEGATIVE Final   Influenza B by PCR NEGATIVE NEGATIVE Final    Comment: (NOTE) The Xpert Xpress SARS-CoV-2/FLU/RSV plus assay is intended as an aid in the diagnosis of influenza from Nasopharyngeal swab specimens and should not be used as a sole basis for treatment. Nasal washings and aspirates are unacceptable for Xpert Xpress SARS-CoV-2/FLU/RSV testing.  Fact Sheet for Patients: [place]  Fact Sheet for Healthcare Providers: BloggerCourse.com  This test is not yet approved or cleared by the [employer] FDA and has been authorized for detection and/or diagnosis of SARS-CoV-2 by FDA under an Emergency Use Authorization (EUA). This  EUA will remain in effect (meaning this test can be used) for the duration of the COVID-19 declaration under Section 564(b)(1) of the Act, 21 U.S.C. section 360bbb-3(b)(1), unless the authorization is terminated or revoked.  Performed at [facility], [address] [place], [place] [address]   CSF culture w Gram Stain     Status: None (Preliminary result)   Collection Time: 04/04/21  9:25 AM   Specimen: PATH Cytology CSF; Cerebrospinal Fluid  Result Value Ref Range Status   Specimen Description CSF  Final   Special Requests NONE  Final   Gram Stain   Final    WBC PRESENT,BOTH PMN AND MONONUCLEAR NO  ORGANISMS SEEN CYTOSPIN SMEAR Performed at [facility], [address], [place], [place] [address]    Culture PENDING  Incomplete   Report Status PENDING  Incomplete         Radiology Studies: CT ANGIO HEAD NECK W WO CM  Result Date: 04/03/2021 CLINICAL DATA:  Monocular vision loss EXAM: CT ANGIOGRAPHY HEAD AND NECK TECHNIQUE: Multidetector CT imaging of the head and neck was performed using the standard protocol during bolus administration of intravenous contrast. Multiplanar CT image reconstructions and MIPs were obtained to evaluate the vascular anatomy. Carotid stenosis measurements (when applicable) are obtained utilizing NASCET criteria, using the distal internal carotid diameter as the denominator. CONTRAST:  59mL OMNIPAQUE IOHEXOL 350 MG/ML SOLN COMPARISON:  Same-day brain MRI FINDINGS: CT HEAD FINDINGS Brain: There is no evidence of acute intracranial hemorrhage, extra-axial fluid collection, or acute infarct. Parenchymal volume is normal. The ventricles are normal in size. Scattered foci of hypodensity in the subcortical and periventricular white matter are noted corresponding to FLAIR signal abnormality seen on the same-day brain MRI. There is no solid mass lesion.  There is no midline shift. Vascular: See below Skull: Normal. Negative for fracture or focal lesion. Sinuses: The imaged paranasal sinuses are clear. Orbits: The globes and orbits are unremarkable, though better assessed on the same day orbital MRI. Review of the MIP images confirms the above findings CTA NECK FINDINGS Aortic arch: Standard branching. Imaged portion shows no evidence of aneurysm or dissection. No significant stenosis of the major arch vessel origins. Right carotid system: There is mild soft and calcified atherosclerotic plaque at the proximal right internal carotid artery without hemodynamically significant stenosis or occlusion. There is no dissection or aneurysm. The right common and external carotid  arteries are patent. Left carotid system: There is mild soft and calcified atherosclerotic plaque at the proximal left internal carotid artery without hemodynamically significant stenosis or occlusion. There is no dissection or aneurysm. The left common and external carotid arteries are patent. Vertebral arteries: The vertebral arteries are patent, without hemodynamically significant stenosis, occlusion, dissection, or aneurysm. Skeleton: There is no acute osseous abnormality or aggressive osseous lesion. There is no visible canal hematoma. Other neck: There is a subcentimeter left thyroid nodule. The soft tissues are otherwise unremarkable. Upper chest: The imaged lung apices are clear. Review of the MIP images confirms the above findings CTA HEAD FINDINGS Anterior circulation: The cavernous ICAs are patent. The ophthalmic arteries are identified. The bilateral MCAs and ACAs are patent. There is no aneurysm. Posterior circulation: The bilateral V4 segments are patent. The bilateral PCAs are patent. The posterior communicating arteries are not identified. There is no aneurysm. Venous sinuses: As permitted by contrast timing, patent. Anatomic variants: None. Review of the MIP images confirms the above findings IMPRESSION: 1. No acute intracranial pathology. 2.  Mild calcified atherosclerotic plaque in the bilateral carotid bulbs without hemodynamically significant stenosis or occlusion. Otherwise, patent vasculature of the head and neck. Electronically Signed   By: Lesia Hausen M.D.   On: 04/03/2021 16:04   MR BRAIN W WO CONTRAST  Result Date: 04/03/2021 CLINICAL DATA:  Loss of vision in left eye, suspect optic neuritis versus stroke EXAM: MRI HEAD AND ORBITS WITHOUT AND WITH CONTRAST TECHNIQUE: Multiplanar, multiecho pulse sequences of the brain and surrounding structures were obtained without and with intravenous contrast. Multiplanar, multiecho pulse sequences of the orbits and surrounding structures were  obtained including fat saturation techniques, before and after intravenous contrast administration. CONTRAST:  6.72mL GADAVIST GADOBUTROL 1 MMOL/ML IV SOLN COMPARISON:  CT head 04/28/2006 FINDINGS: MRI HEAD FINDINGS Brain: There is no evidence of acute intracranial hemorrhage, extra-axial fluid collection, or acute infarct. There are multiple foci of FLAIR signal abnormality throughout the subcortical and periventricular white matter, nonspecific. There is no associated enhancement or diffusion restriction. The ventricles are normal in size. There is no solid mass lesion. There is no midline shift. Vascular: Normal flow voids. Skull and upper cervical spine: Normal marrow signal. Other: None. MRI ORBITS FINDINGS Orbits: There is mild increased T2 signal in the left optic nerve with associated pain enhancement. There is diffusion restriction along the left optic nerve. There is no significant inflammatory change in the surrounding orbital fat. The left globe is unremarkable. The left extraocular muscles are normal. The right globe, orbit, and optic nerve are normal. Visualized sinuses: The paranasal sinuses are clear. Soft tissues: Unremarkable. IMPRESSION: 1. Abnormal T2 signal and feint enhancement and possible diffusion restriction in the left optic nerve, without significant inflammatory change in the surrounding fat. Findings are suggestive of optic neuritis with differential including optic nerve ischemia. 2. Patchy foci of FLAIR signal abnormality in the subcortical and periventricular white matter are nonspecific. These most commonly reflect sequela of chronic white matter microangiopathy, with differential including demyelination. Electronically Signed   By: Lesia Hausen M.D.   On: 04/03/2021 15:33   ECHOCARDIOGRAM COMPLETE BUBBLE STUDY  Result Date: 04/04/2021    ECHOCARDIOGRAM REPORT   Patient Name:   DERRISHA FOOS [facility] Date of Exam: 04/04/2021 Medical Rec #:  [id number]        Height:       64.0 in  Accession #:    [id number]       Weight:       148.0 lb Date of Birth:  [birth year]        BSA:          1.721 m Patient Age:    64 years         BP:           142/71 mmHg Patient Gender: F                HR:           84 bpm. Exam Location:  Inpatient Procedure: 2D Echo, Cardiac Doppler, Color Doppler and Saline Contrast Bubble            Study Indications:    TIA  History:        Patient has no prior history of Echocardiogram examinations.                 Risk Factors:Hypertension and Hyperlipidemia.  Sonographer:    Vanetta Shawl Referring Phys: [id number] PING T ZHANG IMPRESSIONS  1. Left ventricular ejection fraction, by estimation, is 60 to 65%. The  left ventricle has normal function. The left ventricle has no regional wall motion abnormalities. There is mild left ventricular hypertrophy. Left ventricular diastolic parameters were normal.  2. Right ventricular systolic function is normal. The right ventricular size is normal. Tricuspid regurgitation signal is inadequate for assessing PA pressure.  3. The mitral valve is normal in structure. No evidence of mitral valve regurgitation.  4. The aortic valve was not well visualized. Aortic valve regurgitation is not visualized. No aortic stenosis is present.  5. The inferior vena cava is normal in size with <50% respiratory variability, suggesting right atrial pressure of 8 mmHg.  6. Agitated saline contrast bubble study was negative, with no evidence of any interatrial shunt. Technically difficult study, but no shunting seen FINDINGS  Left Ventricle: Left ventricular ejection fraction, by estimation, is 60 to 65%. The left ventricle has normal function. The left ventricle has no regional wall motion abnormalities. The left ventricular internal cavity size was normal in size. There is  mild left ventricular hypertrophy. Left ventricular diastolic parameters were normal. Right Ventricle: The right ventricular size is normal. No increase in right ventricular wall  thickness. Right ventricular systolic function is normal. Tricuspid regurgitation signal is inadequate for assessing PA pressure. Left Atrium: Left atrial size was normal in size. Right Atrium: Right atrial size was not well visualized. Pericardium: Trivial pericardial effusion is present. Mitral Valve: The mitral valve is normal in structure. No evidence of mitral valve regurgitation. Tricuspid Valve: The tricuspid valve is normal in structure. Tricuspid valve regurgitation is not demonstrated. Aortic Valve: The aortic valve was not well visualized. Aortic valve regurgitation is not visualized. No aortic stenosis is present. Aortic valve mean gradient measures 4.0 mmHg. Aortic valve peak gradient measures 8.1 mmHg. Aortic valve area, by VTI measures 2.39 cm. Pulmonic Valve: The pulmonic valve was grossly normal. Pulmonic valve regurgitation is trivial. Aorta: The aortic root and ascending aorta are structurally normal, with no evidence of dilitation. Venous: The inferior vena cava is normal in size with less than 50% respiratory variability, suggesting right atrial pressure of 8 mmHg. IAS/Shunts: The interatrial septum was not well visualized. Agitated saline contrast was given intravenously to evaluate for intracardiac shunting. Agitated saline contrast bubble study was negative, with no evidence of any interatrial shunt.  LEFT VENTRICLE PLAX 2D LVIDd:         3.90 cm     Diastology LVIDs:         2.50 cm     LV e' medial:    8.05 cm/s LV PW:         1.10 cm     LV E/e' medial:  9.4 LV IVS:        1.10 cm     LV e' lateral:   11.50 cm/s LVOT diam:     1.90 cm     LV E/e' lateral: 6.5 LV SV:         64 LV SV Index:   37 LVOT Area:     2.84 cm  LV Volumes (MOD) LV vol d, MOD A2C: 59.3 ml LV vol d, MOD A4C: 95.3 ml LV vol s, MOD A2C: 27.7 ml LV vol s, MOD A4C: 40.5 ml LV SV MOD A2C:     31.6 ml LV SV MOD A4C:     95.3 ml LV SV MOD BP:      42.7 ml IVC IVC diam: 1.90 cm LEFT ATRIUM  Index LA diam:         3.20 cm 1.86 cm/m LA Vol (A2C):   45.3 ml 26.32 ml/m LA Vol (A4C):   33.7 ml 19.58 ml/m LA Biplane Vol: 39.3 ml 22.83 ml/m  AORTIC VALVE                    PULMONIC VALVE AV Area (Vmax):    2.12 cm     PV Vmax:       1.05 m/s AV Area (Vmean):   2.10 cm     PV Peak grad:  4.4 mmHg AV Area (VTI):     2.39 cm AV Vmax:           142.00 cm/s AV Vmean:          92.700 cm/s AV VTI:            0.267 m AV Peak Grad:      8.1 mmHg AV Mean Grad:      4.0 mmHg LVOT Vmax:         106.00 cm/s LVOT Vmean:        68.700 cm/s LVOT VTI:          0.225 m LVOT/AV VTI ratio: 0.84  AORTA Ao Root diam: 2.70 cm Ao Asc diam:  3.10 cm MITRAL VALVE MV Area (PHT): 4.06 cm     SHUNTS MV Decel Time: 187 msec     Systemic VTI:  0.22 m MV E velocity: 75.30 cm/s   Systemic Diam: 1.90 cm MV A velocity: 111.00 cm/s MV E/A ratio:  0.68 Epifanio Lesches MD Electronically signed by Epifanio Lesches MD Signature Date/Time: 04/04/2021/2:30:09 PM    Final    DG FL GUIDED LUMBAR PUNCTURE  Result Date: 04/04/2021 CLINICAL DATA:  Patient with loss of vision in left eye, normal pressure hydrocephalus. Referral for diagnostic lumbar puncture. EXAM: DIAGNOSTIC LUMBAR PUNCTURE UNDER FLUOROSCOPIC GUIDANCE COMPARISON:  None FLUOROSCOPY TIME:  Fluoroscopy Time:  54 seconds Radiation Exposure Index (if provided by the fluoroscopic device): 4.8 mGy Number of Acquired Spot Images: 1 PROCEDURE: Informed consent was obtained from the patient prior to the procedure, including potential complications of headache, allergy, and pain. With the patient prone, the lower back was prepped with Betadine. 1% Lidocaine was used for local anesthesia. Lumbar puncture was performed at the L4-5 level using a 18 gauge needle with return of light pink CSF with an opening pressure of 10 cm water. 9 ml of CSF were obtained for laboratory studies. The patient tolerated the procedure well and there were no apparent complications. IMPRESSION: Successful lumbar puncture at  the L4-5 level under fluoroscopic guidance. Read by Lynnette Caffey, PA-C Electronically Signed   By: Danae Orleans M.D.   On: 04/04/2021 10:24   MR ORBITS W WO CONTRAST  Result Date: 04/03/2021 CLINICAL DATA:  Loss of vision in left eye, suspect optic neuritis versus stroke EXAM: MRI HEAD AND ORBITS WITHOUT AND WITH CONTRAST TECHNIQUE: Multiplanar, multiecho pulse sequences of the brain and surrounding structures were obtained without and with intravenous contrast. Multiplanar, multiecho pulse sequences of the orbits and surrounding structures were obtained including fat saturation techniques, before and after intravenous contrast administration. CONTRAST:  6.25mL GADAVIST GADOBUTROL 1 MMOL/ML IV SOLN COMPARISON:  CT head 04/28/2006 FINDINGS: MRI HEAD FINDINGS Brain: There is no evidence of acute intracranial hemorrhage, extra-axial fluid collection, or acute infarct. There are multiple foci of FLAIR signal abnormality throughout the subcortical and periventricular white matter, nonspecific. There is no  associated enhancement or diffusion restriction. The ventricles are normal in size. There is no solid mass lesion. There is no midline shift. Vascular: Normal flow voids. Skull and upper cervical spine: Normal marrow signal. Other: None. MRI ORBITS FINDINGS Orbits: There is mild increased T2 signal in the left optic nerve with associated pain enhancement. There is diffusion restriction along the left optic nerve. There is no significant inflammatory change in the surrounding orbital fat. The left globe is unremarkable. The left extraocular muscles are normal. The right globe, orbit, and optic nerve are normal. Visualized sinuses: The paranasal sinuses are clear. Soft tissues: Unremarkable. IMPRESSION: 1. Abnormal T2 signal and feint enhancement and possible diffusion restriction in the left optic nerve, without significant inflammatory change in the surrounding fat. Findings are suggestive of optic neuritis  with differential including optic nerve ischemia. 2. Patchy foci of FLAIR signal abnormality in the subcortical and periventricular white matter are nonspecific. These most commonly reflect sequela of chronic white matter microangiopathy, with differential including demyelination. Electronically Signed   By: Lesia Hausen M.D.   On: 04/03/2021 15:33        Scheduled Meds:  amLODipine  10 mg Oral Daily   aspirin EC  81 mg Oral Daily   atorvastatin  80 mg Oral Daily   calcium-vitamin D  1 tablet Oral Daily   enoxaparin (LOVENOX) injection  40 mg Subcutaneous Q24H   nicotine  7 mg Transdermal Daily   pantoprazole (PROTONIX) IV  40 mg Intravenous Q24H   triamcinolone  1 application Topical TID   Continuous Infusions:  methylPREDNISolone (SOLU-MEDROL) injection 1,000 mg (04/03/21 2214)     LOS: 1 day    Time spent:  Zannie Cove, MD [facility]   04/04/2021, 3:50 PM

## 2021-04-04 NOTE — Progress Notes (Signed)
Patient has been NPO with exception to small sip of water this morning in anticipation of repeat Lumbar Puncture this morning.  Diet however was not changed in system.

## 2021-04-05 NOTE — Progress Notes (Signed)
Mobility Specialist Progress Note   04/05/21 1152  Mobility  Activity Ambulated in hall  Level of Assistance Independent  Assistive Device None  Distance Ambulated (ft) 550 ft  Mobility Ambulated independently in hallway  Mobility Response Tolerated well  Mobility performed by Mobility specialist  $Mobility charge 1 Mobility   Received pt in bed having no complaints and agreeable to mobility. Asymptomatic throughout ambulation, returned back to bed w/ call bell by side and all needs met.  Holland Falling Mobility Specialist Phone Number [phone]

## 2021-04-05 NOTE — Progress Notes (Signed)
PROGRESS NOTE    BHUMI GODBEY  ZOX:[id number] DOB: Oct 17, 1955 DOA: 04/03/2021 PCP: Julieanne Manson, MD  Brief Narrative:Kynslei E Lindenbaum is a 65 y.o. 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, temp with gradual loss of left eye vision X 1 week  -Gradually clear progressed from floaters and discomfort in left eye to complete blindness -Subsequently seen by retinal specialist, exam was suspicious for optic neuritis versus ischemic optic neuropathy versus GCA and sent to the ED --MRI T2 signal abnormality on left optic nerve with out significant inflammation changes in the surrounding flat, findings suggestive of optic neuritis with differential including optic nerve ischemia -Neurology consulting, LP completed, started on high-dose IV steroids  Assessment & Plan:   Left eye vision loss Optic neuritis versus ischemic optic neuropathy versus giant cell arteritis -Neurology consulted, LP completed in radiology yesterday, more consistent with a hemorrhagic tap, follow-up CSF IgG index and CSF oligoclonal bands -On day 3/5 of high-dose IV steroids -Ophthalmology note from 11/22 reviewed, did not have edema of the optic disc -Per neurology   HTN -Continue Amlodipine.   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  DVT prophylaxis: Lovenox Code Status: Full code Family Communication: Discussed patient in detail, no family at bedside Disposition Plan:  Status is: Inpatient  Remains inpatient appropriate because: Severity of illness, presumed optic neuritis requiring high-dose IV steroids  Consultants:  Neuro  Procedures: Lumbar puncture 11/23  Antimicrobials:    Subjective: -Feels okay, no events overnight, saw some flashes of light in her left eye yesterday, last night  Objective: Vitals:   04/04/21 1710 04/04/21 2059 04/05/21 0618 04/05/21 1025  BP: 125/80 124/78 114/74 113/78  Pulse: (!) 102 (!) 101 94 100  Resp: Temp:  98 F (36.7 C) (!) 97.4 F (36.3 C) 98.7 F (37.1 C) 98 F (36.7 C)  TempSrc: Oral Axillary Oral Oral  SpO2: 100% 99% 99% 99%  Weight:      Height:        Intake/Output Summary (Last 24 hours) at 04/05/2021 1059 Last data filed at 04/05/2021 0900 Gross per 24 hour  Intake 428.69 ml  Output --  Net 428.69 ml   Filed Weights   04/03/21 1228  Weight: 67.1 kg    Examination: Gen: Awake, Alert, Oriented X 3,  HEENT: no JVD Lungs: Good air movement bilaterally, CTAB CVS: S1S2/RRR Abd: soft, Non tender, non distended, BS present Extremities: No edema Skin: no new rashes on exposed skin   Data Reviewed:   CBC: Recent Labs  Lab 04/03/21 1235  WBC 5.6  NEUTROABS 3.1  HGB 14.2  HCT 41.7  MCV 94.6  PLT 285   Basic Metabolic Panel: Recent Labs  Lab 04/03/21 1235  NA 138  K 3.2*  CL 104  CO2 25  GLUCOSE 113*  BUN <5*  CREATININE 0.40*  CALCIUM 9.7   GFR: Estimated Creatinine Clearance: 68.7 mL/min (A) (by C-G formula based on SCr of 0.4 mg/dL (L)). Liver Function Tests: Recent Labs  Lab 04/03/21 1235  AST 23  ALT 20  ALKPHOS 51  BILITOT 0.5  PROT 8.5*  ALBUMIN 4.1   No results for input(s): LIPASE, AMYLASE in the last 168 hours. No results for input(s): AMMONIA in the last 168 hours. Coagulation Profile: Recent Labs  Lab 04/03/21 1322  INR 1.0   Cardiac Enzymes: No results for input(s): CKTOTAL, CKMB, CKMBINDEX, TROPONINI in the last 168 hours. BNP (last 3 results) No results  for input(s): PROBNP in the last 8760 hours. HbA1C: Recent Labs    04/03/21 1930  HGBA1C 5.2   CBG: No results for input(s): GLUCAP in the last 168 hours. Lipid Profile: Recent Labs    04/03/21 1930  CHOL 250*  HDL 70  LDLCALC 153*  TRIG 133  CHOLHDL 3.6  LDLDIRECT 158.6*   Thyroid Function Tests: No results for input(s): TSH, T4TOTAL, FREET4, T3FREE, THYROIDAB in the last 72 hours. Anemia Panel: No results for input(s): VITAMINB12, FOLATE, FERRITIN,  TIBC, IRON, RETICCTPCT in the last 72 hours. Urine analysis:    Component Value Date/Time   COLORURINE YELLOW 04/03/2021 1322   APPEARANCEUR CLEAR 04/03/2021 1322   LABSPEC 1.044 (H) 04/03/2021 1322   PHURINE 7.0 04/03/2021 1322   GLUCOSEU NEGATIVE 04/03/2021 1322   HGBUR SMALL (A) 04/03/2021 1322   BILIRUBINUR NEGATIVE 04/03/2021 1322   BILIRUBINUR neg 03/30/2019 1447   KETONESUR 20 (A) 04/03/2021 1322   PROTEINUR NEGATIVE 04/03/2021 1322   UROBILINOGEN 1.0 03/30/2019 1447   UROBILINOGEN 1.0 10/20/2012 1652   NITRITE POSITIVE (A) 04/03/2021 1322   LEUKOCYTESUR TRACE (A) 04/03/2021 1322   Sepsis Labs: @LABRCNTIP (procalcitonin:4,lacticidven:4)  ) Recent Results (from the past 240 hour(s))  Resp Panel by RT-PCR (Flu A&B, Covid) Nasopharyngeal Swab     Status: None   Collection Time: 04/03/21  4:01 PM   Specimen: Nasopharyngeal Swab; Nasopharyngeal(NP) swabs in vial transport medium  Result Value Ref Range Status   SARS Coronavirus 2 by RT PCR NEGATIVE NEGATIVE Final    Comment: (NOTE) SARS-CoV-2 target nucleic acids are NOT DETECTED.  The SARS-CoV-2 RNA is generally detectable in upper respiratory specimens during the acute phase of infection. The lowest concentration of SARS-CoV-2 viral copies this assay can detect is 138 copies/mL. A negative result does not preclude SARS-Cov-2 infection and should not be used as the sole basis for treatment or other patient management decisions. A negative result may occur with  improper specimen collection/handling, submission of specimen other than nasopharyngeal swab, presence of viral mutation(s) within the areas targeted by this assay, and inadequate number of viral copies(<138 copies/mL). A negative result must be combined with clinical observations, patient history, and epidemiological information. The expected result is Negative.  Fact Sheet for Patients:  04/05/21  Fact Sheet for Healthcare  Providers:  BloggerCourse.com  This test is no t yet approved or cleared by the [employer] FDA and  has been authorized for detection and/or diagnosis of SARS-CoV-2 by FDA under an Emergency Use Authorization (EUA). This EUA will remain  in effect (meaning this test can be used) for the duration of the COVID-19 declaration under Section 564(b)(1) of the Act, 21 U.S.C.section 360bbb-3(b)(1), unless the authorization is terminated  or revoked sooner.       Influenza A by PCR NEGATIVE NEGATIVE Final   Influenza B by PCR NEGATIVE NEGATIVE Final    Comment: (NOTE) The Xpert Xpress SARS-CoV-2/FLU/RSV plus assay is intended as an aid in the diagnosis of influenza from Nasopharyngeal swab specimens and should not be used as a sole basis for treatment. Nasal washings and aspirates are unacceptable for Xpert Xpress SARS-CoV-2/FLU/RSV testing.  Fact Sheet for Patients: [place]  Fact Sheet for Healthcare Providers: BloggerCourse.com  This test is not yet approved or cleared by the [employer] FDA and has been authorized for detection and/or diagnosis of SARS-CoV-2 by FDA under an Emergency Use Authorization (EUA). This EUA will remain in effect (meaning this test can be used) for the duration of the COVID-19  declaration under Section 564(b)(1) of the Act, 21 U.S.C. section 360bbb-3(b)(1), unless the authorization is terminated or revoked.  Performed at [facility], [address], [place], [place] [address]   CSF culture w Gram Stain     Status: None (Preliminary result)   Collection Time: 04/04/21  9:25 AM   Specimen: PATH Cytology CSF; Cerebrospinal Fluid  Result Value Ref Range Status   Specimen Description CSF  Final   Special Requests NONE  Final   Gram Stain   Final    WBC PRESENT,BOTH PMN AND MONONUCLEAR NO ORGANISMS SEEN CYTOSPIN SMEAR    Culture   Final    NO GROWTH < 24  HOURS Performed at [facility], [address] [place], [place] [address]    Report Status PENDING  Incomplete         Radiology Studies: CT ANGIO HEAD NECK W WO CM  Result Date: 04/03/2021 CLINICAL DATA:  Monocular vision loss EXAM: CT ANGIOGRAPHY HEAD AND NECK TECHNIQUE: Multidetector CT imaging of the head and neck was performed using the standard protocol during bolus administration of intravenous contrast. Multiplanar CT image reconstructions and MIPs were obtained to evaluate the vascular anatomy. Carotid stenosis measurements (when applicable) are obtained utilizing NASCET criteria, using the distal internal carotid diameter as the denominator. CONTRAST:  56mL OMNIPAQUE IOHEXOL 350 MG/ML SOLN COMPARISON:  Same-day brain MRI FINDINGS: CT HEAD FINDINGS Brain: There is no evidence of acute intracranial hemorrhage, extra-axial fluid collection, or acute infarct. Parenchymal volume is normal. The ventricles are normal in size. Scattered foci of hypodensity in the subcortical and periventricular white matter are noted corresponding to FLAIR signal abnormality seen on the same-day brain MRI. There is no solid mass lesion.  There is no midline shift. Vascular: See below Skull: Normal. Negative for fracture or focal lesion. Sinuses: The imaged paranasal sinuses are clear. Orbits: The globes and orbits are unremarkable, though better assessed on the same day orbital MRI. Review of the MIP images confirms the above findings CTA NECK FINDINGS Aortic arch: Standard branching. Imaged portion shows no evidence of aneurysm or dissection. No significant stenosis of the major arch vessel origins. Right carotid system: There is mild soft and calcified atherosclerotic plaque at the proximal right internal carotid artery without hemodynamically significant stenosis or occlusion. There is no dissection or aneurysm. The right common and external carotid arteries are patent. Left carotid system: There is mild  soft and calcified atherosclerotic plaque at the proximal left internal carotid artery without hemodynamically significant stenosis or occlusion. There is no dissection or aneurysm. The left common and external carotid arteries are patent. Vertebral arteries: The vertebral arteries are patent, without hemodynamically significant stenosis, occlusion, dissection, or aneurysm. Skeleton: There is no acute osseous abnormality or aggressive osseous lesion. There is no visible canal hematoma. Other neck: There is a subcentimeter left thyroid nodule. The soft tissues are otherwise unremarkable. Upper chest: The imaged lung apices are clear. Review of the MIP images confirms the above findings CTA HEAD FINDINGS Anterior circulation: The cavernous ICAs are patent. The ophthalmic arteries are identified. The bilateral MCAs and ACAs are patent. There is no aneurysm. Posterior circulation: The bilateral V4 segments are patent. The bilateral PCAs are patent. The posterior communicating arteries are not identified. There is no aneurysm. Venous sinuses: As permitted by contrast timing, patent. Anatomic variants: None. Review of the MIP images confirms the above findings IMPRESSION: 1. No acute intracranial pathology. 2. Mild calcified atherosclerotic plaque in the bilateral carotid  bulbs without hemodynamically significant stenosis or occlusion. Otherwise, patent vasculature of the head and neck. Electronically Signed   By: Lesia Hausen M.D.   On: 04/03/2021 16:04   MR BRAIN W WO CONTRAST  Result Date: 04/03/2021 CLINICAL DATA:  Loss of vision in left eye, suspect optic neuritis versus stroke EXAM: MRI HEAD AND ORBITS WITHOUT AND WITH CONTRAST TECHNIQUE: Multiplanar, multiecho pulse sequences of the brain and surrounding structures were obtained without and with intravenous contrast. Multiplanar, multiecho pulse sequences of the orbits and surrounding structures were obtained including fat saturation techniques, before and  after intravenous contrast administration. CONTRAST:  6.1mL GADAVIST GADOBUTROL 1 MMOL/ML IV SOLN COMPARISON:  CT head 04/28/2006 FINDINGS: MRI HEAD FINDINGS Brain: There is no evidence of acute intracranial hemorrhage, extra-axial fluid collection, or acute infarct. There are multiple foci of FLAIR signal abnormality throughout the subcortical and periventricular white matter, nonspecific. There is no associated enhancement or diffusion restriction. The ventricles are normal in size. There is no solid mass lesion. There is no midline shift. Vascular: Normal flow voids. Skull and upper cervical spine: Normal marrow signal. Other: None. MRI ORBITS FINDINGS Orbits: There is mild increased T2 signal in the left optic nerve with associated pain enhancement. There is diffusion restriction along the left optic nerve. There is no significant inflammatory change in the surrounding orbital fat. The left globe is unremarkable. The left extraocular muscles are normal. The right globe, orbit, and optic nerve are normal. Visualized sinuses: The paranasal sinuses are clear. Soft tissues: Unremarkable. IMPRESSION: 1. Abnormal T2 signal and feint enhancement and possible diffusion restriction in the left optic nerve, without significant inflammatory change in the surrounding fat. Findings are suggestive of optic neuritis with differential including optic nerve ischemia. 2. Patchy foci of FLAIR signal abnormality in the subcortical and periventricular white matter are nonspecific. These most commonly reflect sequela of chronic white matter microangiopathy, with differential including demyelination. Electronically Signed   By: Lesia Hausen M.D.   On: 04/03/2021 15:33   MR CERVICAL SPINE W WO CONTRAST  Result Date: 04/04/2021 CLINICAL DATA:  C-spine CSF leak suspected EXAM: MRI CERVICAL SPINE WITHOUT AND WITH CONTRAST TECHNIQUE: Multiplanar and multiecho pulse sequences of the cervical spine, to include the craniocervical  junction and cervicothoracic junction, were obtained without and with intravenous contrast. CONTRAST:  6.68mL GADAVIST GADOBUTROL 1 MMOL/ML IV SOLN COMPARISON:  CTA neck 04/03/2021 FINDINGS: Alignment: Normal. Vertebrae: Vertebral body heights are preserved. There is no marrow signal abnormality. There is no abnormal marrow enhancement. Cord/spinal canal: Normal in signal and morphology. There is no abnormal enhancement in the cord. There is no abnormal fluid collection in the spinal canal to suggest a source of a CSF leak. Posterior Fossa, vertebral arteries, paraspinal tissues: The imaged posterior fossa is unremarkable. The paraspinal soft tissues are unremarkable. The vertebral artery flow voids are present. Disc levels: The disc heights are overall preserved. There is mild facet arthropathy in the cervical spine with trace fluid on the left at C2-C3 and C3-C4. C2-C3: No significant spinal canal or neural foraminal stenosis. C3-C4: No significant spinal canal or neural foraminal stenosis. C4-C5: There is mild uncovertebral and facet arthropathy resulting in mild left and no significant right neural foraminal stenosis and no significant spinal canal stenosis. C5-C6: There is mild uncovertebral and facet arthropathy resulting in mild left and no significant right neural foraminal stenosis and no significant spinal canal stenosis. C6-C7: There is a mild posterior disc osteophyte complex and mild uncovertebral and facet arthropathy resulting in  mild left and no significant right neural foraminal stenosis and no significant spinal canal stenosis. C7-T1: No significant spinal canal or neural foraminal stenosis. Other: There is a subcentimeter left thyroid nodule. IMPRESSION: 1. No acute findings in the cervical spine. No abnormal fluid collection to suggest a source of CSF leak. 2. Mild degenerative changes as above without high-grade spinal canal or neural foraminal stenosis. 3. No evidence of demyelination in the  cervical spine. Electronically Signed   By: Lesia Hausen M.D.   On: 04/04/2021 16:57   MR THORACIC SPINE W WO CONTRAST  Result Date: 04/04/2021 CLINICAL DATA:  Acute myelopathy. Rule out CSF leak. Suspect intracranial hypotension. EXAM: MRI THORACIC WITHOUT AND WITH CONTRAST TECHNIQUE: Multiplanar and multiecho pulse sequences of the thoracic spine were obtained without and with intravenous contrast. CONTRAST:  6.48mL GADAVIST GADOBUTROL 1 MMOL/ML IV SOLN COMPARISON:  None. FINDINGS: Alignment:  Normal Vertebrae: Negative for fracture or mass Cord: Cord signal morphology normal. Negative for cord compression. Paraspinal and other soft tissues: Minimal left pleural effusion. No fluid collection identified. Disc levels: Mild disc degeneration in the thoracic spine with mild disc space narrowing and minimal spurring. No significant stenosis. IMPRESSION: Mild thoracic disc degeneration.  No stenosis or cord compression Negative for paraspinous fluid collection. Minimal left pleural effusion. Electronically Signed   By: Marlan Palau M.D.   On: 04/04/2021 16:54   ECHOCARDIOGRAM COMPLETE BUBBLE STUDY  Result Date: 04/04/2021    ECHOCARDIOGRAM REPORT   Patient Name:   ZNIYA COTTONE [facility] Date of Exam: 04/04/2021 Medical Rec #:  [id number]        Height:       64.0 in Accession #:    [id number]       Weight:       148.0 lb Date of Birth:  09/02/1955        BSA:          1.721 m Patient Age:    64 years         BP:           142/71 mmHg Patient Gender: F                HR:           84 bpm. Exam Location:  Inpatient Procedure: 2D Echo, Cardiac Doppler, Color Doppler and Saline Contrast Bubble            Study Indications:    TIA  History:        Patient has no prior history of Echocardiogram examinations.                 Risk Factors:Hypertension and Hyperlipidemia.  Sonographer:    Vanetta Shawl Referring Phys: [id number] PING T ZHANG IMPRESSIONS  1. Left ventricular ejection fraction, by estimation, is 60 to 65%. The  left ventricle has normal function. The left ventricle has no regional wall motion abnormalities. There is mild left ventricular hypertrophy. Left ventricular diastolic parameters were normal.  2. Right ventricular systolic function is normal. The right ventricular size is normal. Tricuspid regurgitation signal is inadequate for assessing PA pressure.  3. The mitral valve is normal in structure. No evidence of mitral valve regurgitation.  4. The aortic valve was not well visualized. Aortic valve regurgitation is not visualized. No aortic stenosis is present.  5. The inferior vena cava is normal in size with <50% respiratory variability, suggesting right atrial pressure of 8 mmHg.  6. Agitated saline contrast bubble study was negative,  with no evidence of any interatrial shunt. Technically difficult study, but no shunting seen FINDINGS  Left Ventricle: Left ventricular ejection fraction, by estimation, is 60 to 65%. The left ventricle has normal function. The left ventricle has no regional wall motion abnormalities. The left ventricular internal cavity size was normal in size. There is  mild left ventricular hypertrophy. Left ventricular diastolic parameters were normal. Right Ventricle: The right ventricular size is normal. No increase in right ventricular wall thickness. Right ventricular systolic function is normal. Tricuspid regurgitation signal is inadequate for assessing PA pressure. Left Atrium: Left atrial size was normal in size. Right Atrium: Right atrial size was not well visualized. Pericardium: Trivial pericardial effusion is present. Mitral Valve: The mitral valve is normal in structure. No evidence of mitral valve regurgitation. Tricuspid Valve: The tricuspid valve is normal in structure. Tricuspid valve regurgitation is not demonstrated. Aortic Valve: The aortic valve was not well visualized. Aortic valve regurgitation is not visualized. No aortic stenosis is present. Aortic valve mean gradient  measures 4.0 mmHg. Aortic valve peak gradient measures 8.1 mmHg. Aortic valve area, by VTI measures 2.39 cm. Pulmonic Valve: The pulmonic valve was grossly normal. Pulmonic valve regurgitation is trivial. Aorta: The aortic root and ascending aorta are structurally normal, with no evidence of dilitation. Venous: The inferior vena cava is normal in size with less than 50% respiratory variability, suggesting right atrial pressure of 8 mmHg. IAS/Shunts: The interatrial septum was not well visualized. Agitated saline contrast was given intravenously to evaluate for intracardiac shunting. Agitated saline contrast bubble study was negative, with no evidence of any interatrial shunt.  LEFT VENTRICLE PLAX 2D LVIDd:         3.90 cm     Diastology LVIDs:         2.50 cm     LV e' medial:    8.05 cm/s LV PW:         1.10 cm     LV E/e' medial:  9.4 LV IVS:        1.10 cm     LV e' lateral:   11.50 cm/s LVOT diam:     1.90 cm     LV E/e' lateral: 6.5 LV SV:         64 LV SV Index:   37 LVOT Area:     2.84 cm  LV Volumes (MOD) LV vol d, MOD A2C: 59.3 ml LV vol d, MOD A4C: 95.3 ml LV vol s, MOD A2C: 27.7 ml LV vol s, MOD A4C: 40.5 ml LV SV MOD A2C:     31.6 ml LV SV MOD A4C:     95.3 ml LV SV MOD BP:      42.7 ml IVC IVC diam: 1.90 cm LEFT ATRIUM             Index LA diam:        3.20 cm 1.86 cm/m LA Vol (A2C):   45.3 ml 26.32 ml/m LA Vol (A4C):   33.7 ml 19.58 ml/m LA Biplane Vol: 39.3 ml 22.83 ml/m  AORTIC VALVE                    PULMONIC VALVE AV Area (Vmax):    2.12 cm     PV Vmax:       1.05 m/s AV Area (Vmean):   2.10 cm     PV Peak grad:  4.4 mmHg AV Area (VTI):     2.39 cm AV Vmax:  142.00 cm/s AV Vmean:          92.700 cm/s AV VTI:            0.267 m AV Peak Grad:      8.1 mmHg AV Mean Grad:      4.0 mmHg LVOT Vmax:         106.00 cm/s LVOT Vmean:        68.700 cm/s LVOT VTI:          0.225 m LVOT/AV VTI ratio: 0.84  AORTA Ao Root diam: 2.70 cm Ao Asc diam:  3.10 cm MITRAL VALVE MV Area (PHT): 4.06 cm      SHUNTS MV Decel Time: 187 msec     Systemic VTI:  0.22 m MV E velocity: 75.30 cm/s   Systemic Diam: 1.90 cm MV A velocity: 111.00 cm/s MV E/A ratio:  0.68 Epifanio Lesches MD Electronically signed by Epifanio Lesches MD Signature Date/Time: 04/04/2021/2:30:09 PM    Final    DG FL GUIDED LUMBAR PUNCTURE  Result Date: 04/04/2021 CLINICAL DATA:  Patient with loss of vision in left eye, normal pressure hydrocephalus. Referral for diagnostic lumbar puncture. EXAM: DIAGNOSTIC LUMBAR PUNCTURE UNDER FLUOROSCOPIC GUIDANCE COMPARISON:  None FLUOROSCOPY TIME:  Fluoroscopy Time:  54 seconds Radiation Exposure Index (if provided by the fluoroscopic device): 4.8 mGy Number of Acquired Spot Images: 1 PROCEDURE: Informed consent was obtained from the patient prior to the procedure, including potential complications of headache, allergy, and pain. With the patient prone, the lower back was prepped with Betadine. 1% Lidocaine was used for local anesthesia. Lumbar puncture was performed at the L4-5 level using a 18 gauge needle with return of light pink CSF with an opening pressure of 10 cm water. 9 ml of CSF were obtained for laboratory studies. The patient tolerated the procedure well and there were no apparent complications. IMPRESSION: Successful lumbar puncture at the L4-5 level under fluoroscopic guidance. Read by Lynnette Caffey, PA-C Electronically Signed   By: Danae Orleans M.D.   On: 04/04/2021 10:24   MR ORBITS W WO CONTRAST  Result Date: 04/03/2021 CLINICAL DATA:  Loss of vision in left eye, suspect optic neuritis versus stroke EXAM: MRI HEAD AND ORBITS WITHOUT AND WITH CONTRAST TECHNIQUE: Multiplanar, multiecho pulse sequences of the brain and surrounding structures were obtained without and with intravenous contrast. Multiplanar, multiecho pulse sequences of the orbits and surrounding structures were obtained including fat saturation techniques, before and after intravenous contrast administration.  CONTRAST:  6.27mL GADAVIST GADOBUTROL 1 MMOL/ML IV SOLN COMPARISON:  CT head 04/28/2006 FINDINGS: MRI HEAD FINDINGS Brain: There is no evidence of acute intracranial hemorrhage, extra-axial fluid collection, or acute infarct. There are multiple foci of FLAIR signal abnormality throughout the subcortical and periventricular white matter, nonspecific. There is no associated enhancement or diffusion restriction. The ventricles are normal in size. There is no solid mass lesion. There is no midline shift. Vascular: Normal flow voids. Skull and upper cervical spine: Normal marrow signal. Other: None. MRI ORBITS FINDINGS Orbits: There is mild increased T2 signal in the left optic nerve with associated pain enhancement. There is diffusion restriction along the left optic nerve. There is no significant inflammatory change in the surrounding orbital fat. The left globe is unremarkable. The left extraocular muscles are normal. The right globe, orbit, and optic nerve are normal. Visualized sinuses: The paranasal sinuses are clear. Soft tissues: Unremarkable. IMPRESSION: 1. Abnormal T2 signal and feint enhancement and possible diffusion restriction in the left optic nerve, without  significant inflammatory change in the surrounding fat. Findings are suggestive of optic neuritis with differential including optic nerve ischemia. 2. Patchy foci of FLAIR signal abnormality in the subcortical and periventricular white matter are nonspecific. These most commonly reflect sequela of chronic white matter microangiopathy, with differential including demyelination. Electronically Signed   By: Lesia Hausen M.D.   On: 04/03/2021 15:33    Scheduled Meds:  amLODipine  10 mg Oral Daily   aspirin EC  81 mg Oral Daily   atorvastatin  80 mg Oral Daily   calcium-vitamin D  1 tablet Oral Daily   enoxaparin (LOVENOX) injection  40 mg Subcutaneous Q24H   nicotine  7 mg Transdermal Daily   pantoprazole (PROTONIX) IV  40 mg Intravenous Q24H    triamcinolone  1 application Topical TID   Continuous Infusions:  methylPREDNISolone (SOLU-MEDROL) injection 1,000 mg (04/04/21 2151)     LOS: 2 days    Time spent:  Zannie Cove, MD [facility]   04/05/2021, 10:59 AM

## 2021-04-05 NOTE — Progress Notes (Signed)
Neurology Progress Note  S: No subjective change in vision after 1056m IV solumedrol x1. No new neurologic complaints.   O:  LP 04/05/21 RBC 6650 / WBC 3 Protein 135 Glucose 87 OCB, IgG index in process  Stroke Labs     Component Value Date/Time   CHOL 250 (H) 04/03/2021 1930   CHOL 225 (H) 02/13/2021 0915   TRIG 133 04/03/2021 1930   HDL 70 04/03/2021 1930   HDL 68 02/13/2021 0915   CHOLHDL 3.6 04/03/2021 1930   VLDL 27 04/03/2021 1930   LDLCALC 153 (H) 04/03/2021 1930   LDLCALC 129 (H) 02/13/2021 0915   LDLDIRECT 158.6 (H) 04/03/2021 1930   LABVLDL 28 02/13/2021 0915    Lab Results  Component Value Date/Time   HGBA1C 5.2 04/03/2021 07:30 PM    MRI c spine wwo: mild DDD without significant neuroforaminal or canal stenosis, no e/o demyelination  MRI t spine wwo: mild DDD, no e/o demyelination  CNS imaging personally reviewed  Vitals:   04/04/21 2059 04/05/21 0618  BP: 124/78 114/74  Pulse: (!) 101 94  Resp: 18 18  Temp: (!) 97.4 F (36.3 C) 98.7 F (37.1 C)  SpO2: 99% 99%    Physical Exam Gen: A&O x4, NAD HEENT: Atraumatic, normocephalic;mucous membranes moist; oropharynx clear, tongue without atrophy or fasciculations. Neck: Supple, trachea midline. Resp: CTAB, no w/r/r CV: RRR, no m/g/r; nml S1 and S2. 2+ symmetric peripheral pulses. Abd: soft/NT/ND; nabs x 4 quad Extrem: Nml bulk; no cyanosis, clubbing, or edema.   Neuro: *MS: A&O x4. Follows multi-step commands.  *Speech: fluid, nondysarthric, able to name and repeat *CN:    I: Deferred   II,III: PERRLA, VFF by confrontation on R, no vision at all in L eye, optic discs unable to be visualized 2/2 pupillary constriction   III,IV,VI: EOMI w/o nystagmus, +pain on eye movement in L eye only, no ptosis   V: Sensation intact from V1 to V3 to LT   VII: Eyelid closure was full.  Smile symmetric.   VIII: Hearing intact to voice   IX,X: Voice normal, palate elevates symmetrically    XI: SCM/trap 5/5  bilat   XII: Tongue protrudes midline, no atrophy or fasciculations    *Motor:   Normal bulk.  No tremor, rigidity or bradykinesia. No pronator drift.     Strength: Dlt Bic Tri WrE WrF FgS Gr HF KnF KnE PlF DoF    Left '5 5 5 5 5 5 5 5 5 5 5 5    ' Right '5 5 5 5 5 5 5 5 5 5 5 5      ' *Sensory: Intact to light touch, pinprick, temperature vibration throughout. Symmetric. Propioception intact bilat.  No double-simultaneous extinction.  *Coordination:  Finger-to-nose, heel-to-shin, rapid alternating motions were intact. *Reflexes:  2+ and symmetric throughout without clonus; toes down-going bilat *Gait: deferred  A/P: This is a 65year old woman with a past medical history significant for discoid lupus, hyperlipidemia, and hypertension who presents to the emergency department with left eye vision loss and pain on L eye movement x1 week.  Differential diagnosis includes optic neuritis, ischemic optic neuropathy, and giant cell arteritis.  It is not clear at this point from history and examination nor imaging what the etiology is.  Optic neuritis is more common in patients under 40 although pain on eye movement in the left does favor inflammatory etiology.  There is faint contrast enhancement of the left optic nerve which is more common in optic neuritis however  it can occur in ischemic optic neuropathy and there is questionable restriction diffusion as well.  Ischemic optic neuropathy would be the more common in her age group however the only significant risk factor that she has from a vascular standpoint is uncontrolled hyperlipidemia (LDL 153).  She has a diagnosis of hypertension but this is well controlled and she is not diabetic.  GCA is also in the differential but she denies prodromal symptoms.  Her ESR was elevated at 47 but she does have a diagnosis of lupus.  There are no clear lesions in the brain parenchyma consistent with demyelination and no e/o demyelination in the cervical or thoracic spinal  cord.  She underwent LP 04/04/21 and inflammatory markers (OCB and IgG index) are pending. Continue high dose steroids for now empiric tx optic neuritis. She should have an ophthalmology consult on Friday as reassessment of appearance of the optic disc may refine the differential diagnosis and she may require temporal artery biopsy if they are concerned for GCA. Of note she does not report any prodromal systemic sx such as weight loss, jaw claudication, or myalgias c/w GCA.  - F/u IgG index CSF and serum + CSF oligoclonal bands - Empiric high dose solumedrol 1059m q 24 hrs x 5 days, end date 11/26 - Ophthalmology consult Friday if no symptomatic improvement - Atorvastatin 847mdaily - Aspirin 8176maily   Will continue to follow.  ColSu MonksD [facility] [phone] 7pm- 7am, please page neurology on call as listed in [facility]

## 2021-04-05 NOTE — Progress Notes (Addendum)
Neurology Progress Note  S: Patient now able to see some light and shadows in L eye, improved from no vision L eye on admission. Vision stable since yesterday. No new neurologic complaints.   O:  LP 04/05/21 RBC 6650 / WBC 3 Protein 135 Glucose 87 OCB, IgG index in process  Stroke Labs     Component Value Date/Time   CHOL 250 (H) 04/03/2021 1930   CHOL 225 (H) 02/13/2021 0915   TRIG 133 04/03/2021 1930   HDL 70 04/03/2021 1930   HDL 68 02/13/2021 0915   CHOLHDL 3.6 04/03/2021 1930   VLDL 27 04/03/2021 1930   LDLCALC 153 (H) 04/03/2021 1930   LDLCALC 129 (H) 02/13/2021 0915   LDLDIRECT 158.6 (H) 04/03/2021 1930   LABVLDL 28 02/13/2021 0915    Lab Results  Component Value Date/Time   HGBA1C 5.2 04/03/2021 07:30 PM    MRI c spine wwo: mild DDD without significant neuroforaminal or canal stenosis, no e/o demyelination  MRI t spine wwo: mild DDD, no e/o demyelination  CNS imaging personally reviewed  Vitals:   04/05/21 1025 04/05/21 1705  BP: 113/78 106/77  Pulse: 100 86  Resp: 18 18  Temp: 98 F (36.7 C) 97.9 F (36.6 C)  SpO2: 99% 99%    Physical Exam Gen: A&O x4, NAD HEENT: Atraumatic, normocephalic;mucous membranes moist; oropharynx clear, tongue without atrophy or fasciculations. Neck: Supple, trachea midline. Resp: CTAB, no w/r/r CV: RRR, no m/g/r; nml S1 and S2. 2+ symmetric peripheral pulses. Abd: soft/NT/ND; nabs x 4 quad Extrem: Nml bulk; no cyanosis, clubbing, or edema.   Neuro: *MS: A&O x4. Follows multi-step commands.  *Speech: fluid, nondysarthric, able to name and repeat *CN:    I: Deferred   II,III: PERRLA, VFF by confrontation on R, able to see light and shadows in L eye some today but unable to differentiate between 1 and 2 fingers centrally or in any quadrant, optic discs unable to be visualized 2/2 pupillary constriction   III,IV,VI: EOMI w/o nystagmus, +pain on eye movement in L eye only, no ptosis   V: Sensation intact from V1 to V3  to LT   VII: Eyelid closure was full.  Smile symmetric.   VIII: Hearing intact to voice   IX,X: Voice normal, palate elevates symmetrically    XI: SCM/trap 5/5 bilat   XII: Tongue protrudes midline, no atrophy or fasciculations    *Motor:   Normal bulk.  No tremor, rigidity or bradykinesia. No pronator drift.     Strength: Dlt Bic Tri WrE WrF FgS Gr HF KnF KnE PlF DoF    Left _0 Right _1 *Sensory: Intact to light touch, pinprick, temperature vibration throughout. Symmetric. Propioception intact bilat.  No double-simultaneous extinction.  *Coordination:  Finger-to-nose, heel-to-shin, rapid alternating motions were intact. *Reflexes:  2+ and symmetric throughout without clonus; toes down-going bilat *Gait: deferred  A/P: This is a 65 year old woman with a past medical history significant for discoid lupus, hyperlipidemia, and hypertension who presents to the emergency department with left eye vision loss and pain on L eye movement x1 week.  Differential diagnosis includes optic neuritis, ischemic optic neuropathy, and giant cell arteritis.  It is not clear at this point from history and examination nor imaging what the etiology is.  Optic neuritis is more common in patients under 40 although  pain on eye movement in the left does favor inflammatory etiology.  There is faint contrast enhancement of the left optic nerve which is more common in optic neuritis however it can occur in ischemic optic neuropathy and there is questionable restriction diffusion as well.  Ischemic optic neuropathy would be the more common in her age group however the only significant risk factor that she has from a vascular standpoint is uncontrolled hyperlipidemia (LDL 153).  She has a diagnosis of hypertension but this is well controlled and she is not diabetic.  GCA is also in the differential but she denies prodromal symptoms.  Her ESR was elevated at 47 but she does have a  diagnosis of lupus.  There are no clear lesions in the brain parenchyma consistent with demyelination and no e/o demyelination in the cervical or thoracic spinal cord.  She underwent LP 04/04/21 and inflammatory markers (OCB and IgG index) are pending. She has actually had slight improvement in L eye vision (now sees light and shadows) therefore will plan to continue IV solumedrol for 5 day course.  - F/u IgG index CSF and serum + CSF oligoclonal bands - High dose solumedrol 1000mg q 24 hrs x 5 days, end date 11/26 - May defer inpatient ophthalmology consult given symptomatic improvement - Atorvastatin 80mg daily - Aspirin 81mg daily   Will continue to follow.  Colleen Stack, MD [facility] [phone]  If 7pm- 7am, please page neurology on call as listed in [facility].      

## 2021-04-06 DIAGNOSIS — H5462 Unqualified visual loss, left eye, normal vision right eye: Secondary | ICD-10-CM

## 2021-04-06 LAB — PATHOLOGIST SMEAR REVIEW

## 2021-04-06 NOTE — Progress Notes (Signed)
PROGRESS NOTE    Jacqueline Fox  TKW:[id number] DOB: 01/09/1956 DOA: 04/03/2021 PCP: Mack Hook, MD  Brief Narrative:Jacqueline Fox is a 65 y.o. 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, temp with gradual loss of left eye vision X 1 week  -Gradually clear progressed from floaters and discomfort in left eye to complete blindness -Subsequently seen by retinal specialist, exam was suspicious for optic neuritis versus ischemic optic neuropathy versus GCA and sent to the ED --MRI T2 signal abnormality on left optic nerve with out significant inflammation changes in the surrounding flat, findings suggestive of optic neuritis with differential including optic nerve ischemia -Neurology consulting, LP completed, started on high-dose IV steroids  Assessment & Plan:   Left eye vision loss Optic neuritis versus ischemic optic neuropathy versus giant cell arteritis -ESR was 45 -Neurology consulted, LP completed in radiology, more consistent with a hemorrhagic tap, follow-up CSF IgG index and CSF oligoclonal bands-still pending -On day 4/5 of high-dose IV steroids -Ophthalmology note from 11/22 reviewed, did not have edema of the optic disc -Per neurology   HTN -Continue Amlodipine.   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  DVT prophylaxis: Lovenox Code Status: Full code Family Communication: Discussed patient in detail, no family at bedside Disposition Plan:  Status is: Inpatient  Remains inpatient appropriate because: Severity of illness, presumed optic neuritis requiring high-dose IV steroids  Consultants:  Neuro  Procedures: Lumbar puncture 11/23  Antimicrobials:    Subjective: -Feels okay, no events overnight, remains blind in the left eye except for occasional light flashes  Objective: Vitals:   04/05/21 1705 04/05/21 2108 04/06/21 0432 04/06/21 0826  BP: 106/77 112/73 114/76 120/72  Pulse: 86 81 65 64  Resp: '18 17 17 18   ' Temp: 97.9 F (36.6 C) 97.8 F (36.6 C) 97.9 F (36.6 C) (!) 97.3 F (36.3 C)  TempSrc: Oral Oral Oral Oral  SpO2: 99% 98%  100%  Weight:      Height:        Intake/Output Summary (Last 24 hours) at 04/06/2021 1210 Last data filed at 04/05/2021 1300 Gross per 24 hour  Intake 480 ml  Output --  Net 480 ml   Filed Weights   04/03/21 1228  Weight: 67.1 kg    Examination: Gen: Gen: Awake, Alert, Oriented X 3,  HEENT: no JVD, left eye vision loss Lungs: Good air movement bilaterally, CTAB CVS: S1S2/RRR Abd: soft, Non tender, non distended, BS present Extremities: No edema Skin: no new rashes on exposed skin  Data Reviewed:   CBC: Recent Labs  Lab 04/03/21 1235  WBC 5.6  NEUTROABS 3.1  HGB 14.2  HCT 41.7  MCV 94.6  PLT 924   Basic Metabolic Panel: Recent Labs  Lab 04/03/21 1235  NA 138  K 3.2*  CL 104  CO2 25  GLUCOSE 113*  BUN <5*  CREATININE 0.40*  CALCIUM 9.7   GFR: Estimated Creatinine Clearance: 68.7 mL/min (A) (by C-G formula based on SCr of 0.4 mg/dL (L)). Liver Function Tests: Recent Labs  Lab 04/03/21 1235  AST 23  ALT 20  ALKPHOS 51  BILITOT 0.5  PROT 8.5*  ALBUMIN 4.1   No results for input(s): LIPASE, AMYLASE in the last 168 hours. No results for input(s): AMMONIA in the last 168 hours. Coagulation Profile: Recent Labs  Lab 04/03/21 1322  INR 1.0   Cardiac Enzymes: No results for input(s): CKTOTAL, CKMB, CKMBINDEX, TROPONINI in the last 168 hours. BNP (last  3 results) No results for input(s): PROBNP in the last 8760 hours. HbA1C: Recent Labs    04/03/21 1930  HGBA1C 5.2   CBG: No results for input(s): GLUCAP in the last 168 hours. Lipid Profile: Recent Labs    04/03/21 1930  CHOL 250*  HDL 70  LDLCALC 153*  TRIG 133  CHOLHDL 3.6  LDLDIRECT 158.6*   Thyroid Function Tests: No results for input(s): TSH, T4TOTAL, FREET4, T3FREE, THYROIDAB in the last 72 hours. Anemia Panel: No results for input(s):  VITAMINB12, FOLATE, FERRITIN, TIBC, IRON, RETICCTPCT in the last 72 hours. Urine analysis:    Component Value Date/Time   COLORURINE YELLOW 04/03/2021 1322   APPEARANCEUR CLEAR 04/03/2021 1322   LABSPEC 1.044 (H) 04/03/2021 1322   PHURINE 7.0 04/03/2021 1322   GLUCOSEU NEGATIVE 04/03/2021 1322   HGBUR SMALL (A) 04/03/2021 1322   BILIRUBINUR NEGATIVE 04/03/2021 1322   BILIRUBINUR neg 03/30/2019 1447   KETONESUR 20 (A) 04/03/2021 1322   PROTEINUR NEGATIVE 04/03/2021 1322   UROBILINOGEN 1.0 03/30/2019 1447   UROBILINOGEN 1.0 10/20/2012 1652   NITRITE POSITIVE (A) 04/03/2021 1322   LEUKOCYTESUR TRACE (A) 04/03/2021 1322   Sepsis Labs: '@LABRCNTIP' (procalcitonin:4,lacticidven:4)  ) Recent Results (from the past 240 hour(s))  Resp Panel by RT-PCR (Flu A&B, Covid) Nasopharyngeal Swab     Status: None   Collection Time: 04/03/21  4:01 PM   Specimen: Nasopharyngeal Swab; Nasopharyngeal(NP) swabs in vial transport medium  Result Value Ref Range Status   SARS Coronavirus 2 by RT PCR NEGATIVE NEGATIVE Final    Comment: (NOTE) SARS-CoV-2 target nucleic acids are NOT DETECTED.  The SARS-CoV-2 RNA is generally detectable in upper respiratory specimens during the acute phase of infection. The lowest concentration of SARS-CoV-2 viral copies this assay can detect is 138 copies/mL. A negative result does not preclude SARS-Cov-2 infection and should not be used as the sole basis for treatment or other patient management decisions. A negative result may occur with  improper specimen collection/handling, submission of specimen other than nasopharyngeal swab, presence of viral mutation(s) within the areas targeted by this assay, and inadequate number of viral copies(<138 copies/mL). A negative result must be combined with clinical observations, patient history, and epidemiological information. The expected result is Negative.  Fact Sheet for Patients:   EntrepreneurPulse.com.au  Fact Sheet for Healthcare Providers:  IncredibleEmployment.be  This test is no t yet approved or cleared by the [place] FDA and  has been authorized for detection and/or diagnosis of SARS-CoV-2 by FDA under an Emergency Use Authorization (EUA). This EUA will remain  in effect (meaning this test can be used) for the duration of the COVID-19 declaration under Section 564(b)(1) of the Act, 21 U.S.C.section 360bbb-3(b)(1), unless the authorization is terminated  or revoked sooner.       Influenza A by PCR NEGATIVE NEGATIVE Final   Influenza B by PCR NEGATIVE NEGATIVE Final    Comment: (NOTE) The Xpert Xpress SARS-CoV-2/FLU/RSV plus assay is intended as an aid in the diagnosis of influenza from Nasopharyngeal swab specimens and should not be used as a sole basis for treatment. Nasal washings and aspirates are unacceptable for Xpert Xpress SARS-CoV-2/FLU/RSV testing.  Fact Sheet for Patients: EntrepreneurPulse.com.au  Fact Sheet for Healthcare Providers: IncredibleEmployment.be  This test is not yet approved or cleared by the [place] FDA and has been authorized for detection and/or diagnosis of SARS-CoV-2 by FDA under an Emergency Use Authorization (EUA). This EUA will remain in effect (meaning this test can be used) for the  duration of the COVID-19 declaration under Section 564(b)(1) of the Act, 21 U.S.C. section 360bbb-3(b)(1), unless the authorization is terminated or revoked.  Performed at [facility], [place] [address], [place], [place] [address]   CSF culture w Gram Stain     Status: None (Preliminary result)   Collection Time: 04/04/21  9:25 AM   Specimen: PATH Cytology CSF; Cerebrospinal Fluid  Result Value Ref Range Status   Specimen Description CSF  Final   Special Requests NONE  Final   Gram Stain   Final    WBC PRESENT,BOTH PMN AND MONONUCLEAR NO  ORGANISMS SEEN CYTOSPIN SMEAR    Culture   Final    NO GROWTH 2 DAYS Performed at [facility], [place] [address], [place], [place] [address]    Report Status PENDING  Incomplete         Radiology Studies: MR CERVICAL SPINE W WO CONTRAST  Result Date: 04/04/2021 CLINICAL DATA:  C-spine CSF leak suspected EXAM: MRI CERVICAL SPINE WITHOUT AND WITH CONTRAST TECHNIQUE: Multiplanar and multiecho pulse sequences of the cervical spine, to include the craniocervical junction and cervicothoracic junction, were obtained without and with intravenous contrast. CONTRAST:  6.12m GADAVIST GADOBUTROL 1 MMOL/ML IV SOLN COMPARISON:  CTA neck 04/03/2021 FINDINGS: Alignment: Normal. Vertebrae: Vertebral body heights are preserved. There is no marrow signal abnormality. There is no abnormal marrow enhancement. Cord/spinal canal: Normal in signal and morphology. There is no abnormal enhancement in the cord. There is no abnormal fluid collection in the spinal canal to suggest a source of a CSF leak. Posterior Fossa, vertebral arteries, paraspinal tissues: The imaged posterior fossa is unremarkable. The paraspinal soft tissues are unremarkable. The vertebral artery flow voids are present. Disc levels: The disc heights are overall preserved. There is mild facet arthropathy in the cervical spine with trace fluid on the left at C2-C3 and C3-C4. C2-C3: No significant spinal canal or neural foraminal stenosis. C3-C4: No significant spinal canal or neural foraminal stenosis. C4-C5: There is mild uncovertebral and facet arthropathy resulting in mild left and no significant right neural foraminal stenosis and no significant spinal canal stenosis. C5-C6: There is mild uncovertebral and facet arthropathy resulting in mild left and no significant right neural foraminal stenosis and no significant spinal canal stenosis. C6-C7: There is a mild posterior disc osteophyte complex and mild uncovertebral and facet arthropathy resulting  in mild left and no significant right neural foraminal stenosis and no significant spinal canal stenosis. C7-T1: No significant spinal canal or neural foraminal stenosis. Other: There is a subcentimeter left thyroid nodule. IMPRESSION: 1. No acute findings in the cervical spine. No abnormal fluid collection to suggest a source of CSF leak. 2. Mild degenerative changes as above without high-grade spinal canal or neural foraminal stenosis. 3. No evidence of demyelination in the cervical spine. Electronically Signed   By: PValetta MoleM.D.   On: 04/04/2021 16:57   MR THORACIC SPINE W WO CONTRAST  Result Date: 04/04/2021 CLINICAL DATA:  Acute myelopathy. Rule out CSF leak. Suspect intracranial hypotension. EXAM: MRI THORACIC WITHOUT AND WITH CONTRAST TECHNIQUE: Multiplanar and multiecho pulse sequences of the thoracic spine were obtained without and with intravenous contrast. CONTRAST:  6.564mGADAVIST GADOBUTROL 1 MMOL/ML IV SOLN COMPARISON:  None. FINDINGS: Alignment:  Normal Vertebrae: Negative for fracture or mass Cord: Cord signal morphology normal. Negative for cord compression. Paraspinal and other soft tissues: Minimal left pleural effusion. No fluid collection identified. Disc levels: Mild disc degeneration in the thoracic spine with  mild disc space narrowing and minimal spurring. No significant stenosis. IMPRESSION: Mild thoracic disc degeneration.  No stenosis or cord compression Negative for paraspinous fluid collection. Minimal left pleural effusion. Electronically Signed   By: Franchot Gallo M.D.   On: 04/04/2021 16:54    Scheduled Meds:  amLODipine  10 mg Oral Daily   aspirin EC  81 mg Oral Daily   atorvastatin  80 mg Oral Daily   calcium-vitamin D  1 tablet Oral Daily   enoxaparin (LOVENOX) injection  40 mg Subcutaneous Q24H   nicotine  7 mg Transdermal Daily   pantoprazole (PROTONIX) IV  40 mg Intravenous Q24H   triamcinolone  1 application Topical TID   Continuous Infusions:   methylPREDNISolone (SOLU-MEDROL) injection 1,000 mg (04/05/21 2126)     LOS: 3 days    Time spent: 31mn  PDomenic Polite MD [facility]   04/06/2021, 12:10 PM

## 2021-04-06 NOTE — Progress Notes (Signed)
Mobility Specialist Progress Note:   04/06/21 1035  Mobility  Activity Ambulated in hall  Level of Assistance Independent  Assistive Device None  Distance Ambulated (ft) 1670 ft  Mobility Ambulated independently in hallway  Mobility Response Tolerated well  Mobility performed by Mobility specialist  Bed Position Chair  $Mobility charge 1 Mobility   Met pt ambulation in hallway. Asx during amb, no change in vision/discomfort. Pt back in chair with all needs met.   Nelta Numbers Mobility Specialist  Phone [phone]

## 2021-04-07 LAB — CBC
HCT: 36.9 % (ref 36.0–46.0)
Hemoglobin: 12.6 g/dL (ref 12.0–15.0)
MCH: 32.1 pg (ref 26.0–34.0)
MCHC: 34.1 g/dL (ref 30.0–36.0)
MCV: 93.9 fL (ref 80.0–100.0)
Platelets: 250 10*3/uL (ref 150–400)
RBC: 3.93 MIL/uL (ref 3.87–5.11)
RDW: 13 % (ref 11.5–15.5)
WBC: 9.3 10*3/uL (ref 4.0–10.5)
nRBC: 0 % (ref 0.0–0.2)

## 2021-04-07 LAB — CSF CULTURE W GRAM STAIN: Culture: NO GROWTH

## 2021-04-07 LAB — BASIC METABOLIC PANEL
Anion gap: 14 (ref 5–15)
BUN: 10 mg/dL (ref 8–23)
CO2: 25 mmol/L (ref 22–32)
Calcium: 9.2 mg/dL (ref 8.9–10.3)
Chloride: 101 mmol/L (ref 98–111)
Creatinine, Ser: 0.61 mg/dL (ref 0.44–1.00)
GFR, Estimated: >60 mL/min (ref >60)
Glucose, Bld: 137 mg/dL — ABNORMAL HIGH (ref 70–99)
Potassium: 2.8 mmol/L — ABNORMAL LOW (ref 3.5–5.1)
Sodium: 140 mmol/L (ref 135–145)

## 2021-04-07 MED ORDER — POTASSIUM CHLORIDE CRYS ER 20 MEQ PO TBCR
40.0000 meq | EXTENDED_RELEASE_TABLET | Freq: Two times a day (BID) | ORAL | Status: AC
  Administered 2021-04-07 (×2): 40 meq via ORAL
  Filled 2021-04-07 (×2): qty 2

## 2021-04-07 MED ORDER — POTASSIUM CHLORIDE CRYS ER 20 MEQ PO TBCR: 40.0000 meq | EXTENDED_RELEASE_TABLET | Freq: Two times a day (BID) | ORAL | Status: DC

## 2021-04-07 NOTE — Progress Notes (Signed)
Occupational Therapy Treatment Patient Details Name: Jacqueline Fox MRN: [id number] DOB: August 12, 1955 Today's Date: 04/07/2021   History of present illness Pt is 65 y.o. female presenting to ED on 11/22 with loss of vision in L eye. PMH includes lupus, hyperlipidemia and HTN   OT comments  Pt independent with bed mobility and transfers, Mod I for ADLs during session. Pt able to complete visual motor task without error for 3/3 trials sitting EOB in dim lighting. Pt reports no changes with vision, mobility, or ADLs since evaluation. Pt completes IADL task at vending machine, able to correctly identify, select, and purchase item with min verbal cuing. Pt has adapted well to visual deficits, reviewed low vision strategies for home modification with pt. Pt has met all goals and has no acute OT needs at this time, will s/o. Recommend safe d/c home with family.   Recommendations for follow up therapy are one component of a multi-disciplinary discharge planning process, led by the attending physician.  Recommendations may be updated based on patient status, additional functional criteria and insurance authorization.    Follow Up Recommendations  No OT follow up    Assistance Recommended at Discharge PRN  Equipment Recommendations  None recommended by OT;Other (comment) (pt has all DME at home)    Recommendations for Other Services      Precautions / Restrictions Precautions Precautions: None Restrictions Weight Bearing Restrictions: No       Mobility Bed Mobility Overal bed mobility: Independent                  Transfers Overall transfer level: Independent                       Balance Overall balance assessment: No apparent balance deficits (not formally assessed)                                         ADL either performed or assessed with clinical judgement   ADL Overall ADL's : Modified independent                                        General ADL Comments: Pt completed LB dressing, toileting, standing grooming task, with Mod I with therapist in room    Extremity/Trunk Assessment              Vision   Vision Assessment?: Vision impaired- to be further tested in functional context   Perception     Praxis      Cognition Arousal/Alertness: Awake/alert Behavior During Therapy: WFL for tasks assessed/performed Overall Cognitive Status: Within Functional Limits for tasks assessed                                            Exercises     Shoulder Instructions       General Comments      Pertinent Vitals/ Pain          Home Living  Prior Functioning/Environment              Frequency           Progress Toward Goals  OT Goals(current goals can now be found in the care plan section)     Acute Rehab OT Goals Patient Stated Goal: none stated OT Goal Formulation: With patient Time For Goal Achievement: 04/18/21 Potential to Achieve Goals: Good ADL Goals Pt Will Perform Upper Body Dressing: Independently;standing;sitting Pt Will Perform Lower Body Dressing: Independently;sit to/from stand Pt Will Transfer to Toilet: Independently;regular height toilet;ambulating Additional ADL Goal #1: Pt will perform visual motor task with minimal error and supervision prior to d/c.  Plan Discharge plan remains appropriate    Co-evaluation                 AM-PAC OT "6 Clicks" Daily Activity     Outcome Measure   Help from another person eating meals?: None Help from another person taking care of personal grooming?: None Help from another person toileting, which includes using toliet, bedpan, or urinal?: None Help from another person bathing (including washing, rinsing, drying)?: A Little Help from another person to put on and taking off regular upper body clothing?: None Help from another person to put on  and taking off regular lower body clothing?: None 6 Click Score: 23    End of Session    OT Visit Diagnosis: Other (comment) (low vision, L eye)   Activity Tolerance Patient tolerated treatment well   Patient Left in bed;with call bell/phone within reach   Nurse Communication Mobility status        Time: 3893-7342 OT Time Calculation (min): 18 min  Charges: OT General Charges $OT Visit: 1 Visit OT Treatments $Self Care/Home Management : 8-22 mins  Lynnda Child, OTD, OTR/L Acute Rehab [phone]   Kaylyn Lim 04/07/2021, 1:51 PM

## 2021-04-07 NOTE — Progress Notes (Signed)
PROGRESS NOTE    Jacqueline Fox  TRR:[id number] DOB: 1955-06-29 DOA: 04/03/2021 PCP: Mack Hook, MD  Brief Narrative:Jacqueline Fox is a 65 y.o. 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, temp with gradual loss of left eye vision X 1 week  -Gradually clear progressed from floaters and discomfort in left eye to complete blindness -Subsequently seen by retinal specialist, exam was suspicious for optic neuritis versus ischemic optic neuropathy versus GCA and sent to the ED --MRI T2 signal abnormality on left optic nerve with out significant inflammation changes in the surrounding flat, findings suggestive of optic neuritis with differential including optic nerve ischemia -Neurology consulting, LP completed, started on high-dose IV steroids  Assessment & Plan:   Left eye vision loss Optic neuritis versus ischemic optic neuropathy versus giant cell arteritis -ESR was 45 -Neurology consulted, LP completed in radiology, more consistent with a hemorrhagic tap, follow-up CSF IgG index and CSF oligoclonal bands-still pending -Fifth dose of high-dose pulse steroids tonight-10 PM -Ophthalmology note from 11/22 reviewed, did not have edema of the optic disc -Per neurology, anticipate discharge tomorrow morning, will need close neurology follow-up, ?  Steroids   HTN -Continue Amlodipine.   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  DVT prophylaxis: Lovenox Code Status: Full code Family Communication: Discussed patient in detail, no family at bedside Disposition Plan:  Status is: Inpatient  Remains inpatient appropriate because: Severity of illness, presumed optic neuritis requiring high-dose IV steroids  Consultants:  Neuro  Procedures: Lumbar puncture 11/23  Antimicrobials:    Subjective: -No events overnight, feels okay continues to see flashes of light in her left eye  Objective: Vitals:   04/06/21 1500 04/06/21 2056 04/07/21 0450  04/07/21 0719  BP: 120/76 125/77 106/66 123/90  Pulse: 83 66 66 76  Resp: '18 16 16 16  ' Temp: 98.2 F (36.8 C) 97.8 F (36.6 C) 97.7 F (36.5 C) 98.1 F (36.7 C)  TempSrc: Oral Oral Oral Oral  SpO2: 100% 99% 98% 98%  Weight:      Height:        Intake/Output Summary (Last 24 hours) at 04/07/2021 1439 Last data filed at 04/07/2021 0400 Gross per 24 hour  Intake 480 ml  Output --  Net 480 ml   Filed Weights   04/03/21 1228  Weight: 67.1 kg    Examination: Gen: Awake, Alert, Oriented X 3,  HEENT: no JVD, left eye vision loss Lungs: Good air movement bilaterally, CTAB CVS: S1S2/RRR Abd: soft, Non tender, non distended, BS present Extremities: No edema Skin: no new rashes on exposed skin  Data Reviewed:   CBC: Recent Labs  Lab 04/03/21 1235 04/07/21 0052  WBC 5.6 9.3  NEUTROABS 3.1  --   HGB 14.2 12.6  HCT 41.7 36.9  MCV 94.6 93.9  PLT 285 333   Basic Metabolic Panel: Recent Labs  Lab 04/03/21 1235 04/07/21 0436  NA 138 140  K 3.2* 2.8*  CL 104 101  CO2 25 25  GLUCOSE 113* 137*  BUN <5* 10  CREATININE 0.40* 0.61  CALCIUM 9.7 9.2   GFR: Estimated Creatinine Clearance: 68.7 mL/min (by C-G formula based on SCr of 0.61 mg/dL). Liver Function Tests: Recent Labs  Lab 04/03/21 1235  AST 23  ALT 20  ALKPHOS 51  BILITOT 0.5  PROT 8.5*  ALBUMIN 4.1   No results for input(s): LIPASE, AMYLASE in the last 168 hours. No results for input(s): AMMONIA in the last 168 hours. Coagulation Profile:  Recent Labs  Lab 04/03/21 1322  INR 1.0   Cardiac Enzymes: No results for input(s): CKTOTAL, CKMB, CKMBINDEX, TROPONINI in the last 168 hours. BNP (last 3 results) No results for input(s): PROBNP in the last 8760 hours. HbA1C: No results for input(s): HGBA1C in the last 72 hours.  CBG: No results for input(s): GLUCAP in the last 168 hours. Lipid Profile: No results for input(s): CHOL, HDL, LDLCALC, TRIG, CHOLHDL, LDLDIRECT in the last 72 hours.  Thyroid  Function Tests: No results for input(s): TSH, T4TOTAL, FREET4, T3FREE, THYROIDAB in the last 72 hours. Anemia Panel: No results for input(s): VITAMINB12, FOLATE, FERRITIN, TIBC, IRON, RETICCTPCT in the last 72 hours. Urine analysis:    Component Value Date/Time   COLORURINE YELLOW 04/03/2021 1322   APPEARANCEUR CLEAR 04/03/2021 1322   LABSPEC 1.044 (H) 04/03/2021 1322   PHURINE 7.0 04/03/2021 1322   GLUCOSEU NEGATIVE 04/03/2021 1322   HGBUR SMALL (A) 04/03/2021 1322   BILIRUBINUR NEGATIVE 04/03/2021 1322   BILIRUBINUR neg 03/30/2019 1447   KETONESUR 20 (A) 04/03/2021 1322   PROTEINUR NEGATIVE 04/03/2021 1322   UROBILINOGEN 1.0 03/30/2019 1447   UROBILINOGEN 1.0 10/20/2012 1652   NITRITE POSITIVE (A) 04/03/2021 1322   LEUKOCYTESUR TRACE (A) 04/03/2021 1322   Sepsis Labs: '@LABRCNTIP' (procalcitonin:4,lacticidven:4)  ) Recent Results (from the past 240 hour(s))  Resp Panel by RT-PCR (Flu A&B, Covid) Nasopharyngeal Swab     Status: None   Collection Time: 04/03/21  4:01 PM   Specimen: Nasopharyngeal Swab; Nasopharyngeal(NP) swabs in vial transport medium  Result Value Ref Range Status   SARS Coronavirus 2 by RT PCR NEGATIVE NEGATIVE Final    Comment: (NOTE) SARS-CoV-2 target nucleic acids are NOT DETECTED.  The SARS-CoV-2 RNA is generally detectable in upper respiratory specimens during the acute phase of infection. The lowest concentration of SARS-CoV-2 viral copies this assay can detect is 138 copies/mL. A negative result does not preclude SARS-Cov-2 infection and should not be used as the sole basis for treatment or other patient management decisions. A negative result may occur with  improper specimen collection/handling, submission of specimen other than nasopharyngeal swab, presence of viral mutation(s) within the areas targeted by this assay, and inadequate number of viral copies(<138 copies/mL). A negative result must be combined with clinical observations, patient  history, and epidemiological information. The expected result is Negative.  Fact Sheet for Patients:  EntrepreneurPulse.com.au  Fact Sheet for Healthcare Providers:  IncredibleEmployment.be  This test is no t yet approved or cleared by the [place] FDA and  has been authorized for detection and/or diagnosis of SARS-CoV-2 by FDA under an Emergency Use Authorization (EUA). This EUA will remain  in effect (meaning this test can be used) for the duration of the COVID-19 declaration under Section 564(b)(1) of the Act, 21 U.S.C.section 360bbb-3(b)(1), unless the authorization is terminated  or revoked sooner.       Influenza A by PCR NEGATIVE NEGATIVE Final   Influenza B by PCR NEGATIVE NEGATIVE Final    Comment: (NOTE) The Xpert Xpress SARS-CoV-2/FLU/RSV plus assay is intended as an aid in the diagnosis of influenza from Nasopharyngeal swab specimens and should not be used as a sole basis for treatment. Nasal washings and aspirates are unacceptable for Xpert Xpress SARS-CoV-2/FLU/RSV testing.  Fact Sheet for Patients: EntrepreneurPulse.com.au  Fact Sheet for Healthcare Providers: IncredibleEmployment.be  This test is not yet approved or cleared by the [place] FDA and has been authorized for detection and/or diagnosis of SARS-CoV-2 by FDA under an Emergency Use  Authorization (EUA). This EUA will remain in effect (meaning this test can be used) for the duration of the COVID-19 declaration under Section 564(b)(1) of the Act, 21 U.S.C. section 360bbb-3(b)(1), unless the authorization is terminated or revoked.  Performed at [facility], [place] [address] [place], [place] [address]   CSF culture w Gram Stain     Status: None   Collection Time: 04/04/21  9:25 AM   Specimen: PATH Cytology CSF; Cerebrospinal Fluid  Result Value Ref Range Status   Specimen Description CSF  Final   Special Requests  NONE  Final   Gram Stain   Final    WBC PRESENT,BOTH PMN AND MONONUCLEAR NO ORGANISMS SEEN CYTOSPIN SMEAR    Culture   Final    NO GROWTH 3 DAYS Performed at [facility], [place] [address], [place], [place] [address]    Report Status 04/07/2021 FINAL  Final         Radiology Studies: No results found.  Scheduled Meds:  amLODipine  10 mg Oral Daily   aspirin EC  81 mg Oral Daily   atorvastatin  80 mg Oral Daily   calcium-vitamin D  1 tablet Oral Daily   enoxaparin (LOVENOX) injection  40 mg Subcutaneous Q24H   nicotine  7 mg Transdermal Daily   pantoprazole (PROTONIX) IV  40 mg Intravenous Q24H   potassium chloride  40 mEq Oral BID   triamcinolone  1 application Topical TID   Continuous Infusions:  methylPREDNISolone (SOLU-MEDROL) injection 1,000 mg (04/06/21 2203)     LOS: 4 days    Time spent: 76mn  PDomenic Polite MD [facility]   04/07/2021, 2:39 PM

## 2021-04-07 NOTE — TOC Initial Note (Signed)
Transition of Care [facility]) - Initial/Assessment Note    Patient Details  Name: Jacqueline Fox MRN: [id number] Date of Birth: [birth year]  Transition of Care [facility]) CM/SW Contact:    Verna Czech [place], [place] Phone Number: [phone] 04/07/2021, 3:58 PM  Clinical Narrative:                 Contact patient by phone to discuss financial concerns. Per patient, she is uninsured and was concerned about her need for follow up . Patient states that she had been previously active with the [facility] and will need to be re-certified. Patient states that she has the application and is working on Chief Executive Officer now. Patient also provided with the contact information for [facility] Patient Financial Assistance [phone].  Transition of Care to Continue to follow  [facility], LCSW Transition of Care [phone]   Expected Discharge Plan: Home/Self Care Barriers to Discharge: Continued Medical Work up   Patient Goals and CMS Choice Patient states their goals for this hospitalization and ongoing recovery are:: "I want to be able to see my specialist and get my medications'      Expected Discharge Plan and Services Expected Discharge Plan: Home/Self Care In-house Referral: Clinical Social Work     Living arrangements for the past 2 months: Single Family Home                                      Prior Living Arrangements/Services Living arrangements for the past 2 months: Single Family Home Lives with:: Relatives Patient language and need for interpreter reviewed:: Yes Do you feel safe going back to the place where you live?: Yes      Need for Family Participation in Patient Care: No (Comment) Care giver support system in place?: Yes (comment)   Criminal Activity/Legal Involvement Pertinent to Current Situation/Hospitalization: No - Comment as needed  Activities of Daily Living      Permission Sought/Granted                  Emotional  Assessment   Attitude/Demeanor/Rapport: Engaged   Orientation: : Oriented to  Time, Oriented to Place, Oriented to Self, Oriented to Situation Alcohol / Substance Use: Not Applicable Psych Involvement: No (comment)  Admission diagnosis:  Vision loss of left eye [H54.62] Optic neuritis [H46.9] Patient Active Problem List   Diagnosis Date Noted   Vision loss of left eye    Optic neuritis 04/03/2021   Discoid lupus 02/15/2021   Post-menopausal 08/11/2020   Mixed hyperlipidemia 08/11/2020   Family history of cholangiocarcinoma    Family history of lung cancer    Screening breast examination 04/29/2019   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   Genetic testing 11/27/2017   Family history of colon cancer    Family history of breast cancer    Family history of prostate cancer    Keloid 10/22/2017   Family history of colon cancer in father 10/22/2017   Seborrhea capitis in adult 10/22/2017   Dental decay 10/22/2017   Family history of cancer 09/19/2017   Hypertension 09/19/2017   Hypopigmentation 09/19/2017   PCP:  Julieanne Manson, MD Pharmacy:   [facility] [address] [place], [place] - [address] [place] [address] Phone: [phone] Fax: [phone]     Social Determinants of Health (SDOH) Interventions    Readmission Risk Interventions No flowsheet data found.

## 2021-04-07 NOTE — Progress Notes (Addendum)
Neurology Progress Note  S: She is sitting up in the chair watching TV. She is anxious to be discharged and states that may be tomorrow. She states her vision is the same as yesterday. She can see shadows and can see light, unable differentiate objects or fingers with left eye  O:  LP 04/05/21 RBC 6650 / WBC 3 Protein 135 Glucose 87 OCB, IgG index in process   MRI c spine wwo: mild DDD without significant neuroforaminal or canal stenosis, no e/o demyelination  MRI t spine wwo: mild DDD, no e/o demyelination  CNS imaging personally reviewed  Vitals:   04/07/21 0450 04/07/21 0719  BP: 106/66 123/90  Pulse: 66 76  Resp: 16 16  Temp: 97.7 F (36.5 C) 98.1 F (36.7 C)  SpO2: 98% 98%    Physical Exam Gen: A&O x4, NAD HEENT: Atraumatic, normocephalic;mucous membranes moist; oropharynx clear, tongue without atrophy or fasciculations. Neck: Supple, trachea midline. Resp: CTAB, no w/r/r CV: RRR, no m/g/r; nml S1 and S2. 2+ symmetric peripheral pulses. Abd: soft/NT/ND; nabs x 4 quad Extrem: Nml bulk; no cyanosis, clubbing, or edema.   Neuro: *MS: A&O x4. Follows multi-step commands.  *Speech: fluid, nondysarthric, able to name and repeat *CN:    I: Deferred   II,III: PERRLA, VFF by confrontation on R, able to see light and shadows in L eye some today but unable to differentiate between 1 and 2 fingers centrally or in any quadrant, optic discs unable to be visualized 2/2 pupillary constriction   III,IV,VI: EOMI w/o nystagmus, no ptosis   V: Sensation intact from V1 to V3 to LT   VII: Eyelid closure was full.  Smile symmetric.   VIII: Hearing intact to voice   IX,X: Voice normal, palate elevates symmetrically    XI: SCM/trap 5/5 bilat   XII: Tongue protrudes midline, no atrophy or fasciculations    *Motor:   Normal bulk.  No tremor, rigidity or bradykinesia. No pronator drift.    *Sensory: Intact to light touch, pinprick, temperature vibration throughout. Symmetric.  Propioception intact bilat.  No double-simultaneous extinction.  *Coordination:  Finger-to-nose, heel-to-shin, rapid alternating motions were intact. *Gait: deferred  A/P: This is a 65 year old woman with a past medical history significant for discoid lupus, hyperlipidemia, and hypertension who presents to the emergency department with left eye vision loss and pain on L eye movement x1 week.  Differential diagnosis includes optic neuritis, ischemic optic neuropathy, and giant cell arteritis.  It is not clear at this point from history and examination nor imaging what the etiology is.  Optic neuritis is more common in patients under 40 although pain on eye movement in the left does favor inflammatory etiology.  There is faint contrast enhancement of the left optic nerve which is more common in optic neuritis however it can occur in ischemic optic neuropathy and there is questionable restriction diffusion as well.  Ischemic optic neuropathy would be the more common in her age group however the only significant risk factor that she has from a vascular standpoint is uncontrolled hyperlipidemia (LDL 153).  She has a diagnosis of hypertension but this is well controlled and she is not diabetic.  GCA is also in the differential but she denies prodromal symptoms.  Her ESR was elevated at 45 but she does have a diagnosis of lupus.  There are no clear lesions in the brain parenchyma consistent with demyelination and no e/o demyelination in the cervical or thoracic spinal cord.  She underwent LP 04/04/21 and inflammatory  markers (OCB and IgG index) are pending. She has actually had slight improvement in L eye vision (now sees light and shadows) therefore will plan to continue IV solumedrol for 5 day course.  - F/u IgG index CSF and serum + CSF oligoclonal bands still pending - High dose solumedrol 63m q 24 hrs x 5 days, end date 11/26 - May defer inpatient ophthalmology consult given symptomatic improvement. States  she has appointment on Tuesday    Will continue to follow. DBeulah Gandy NP    Neurology Attending Attestation   I examined the patient and discussed plan with Ms. WRogers BlockerNP. Above note has been edited by me to reflect my findings and recommendations. I personally reviewed CNS imaging. The NP note reflects the plan I formulated. Some improvement in L eye vision but still can only see lights and shadows. Plan for d/c tmrw AFTER she is seen in AM by attending neurologist. Last dose solumedrol tonight. I will arrange close outpatient f/u with neurology. She has an appointment with her outpatient eye doctor on Tues.    CSu Monks MD [facility] [phone]  If 7pm- 7am, please page neurology on call as listed [place]

## 2021-04-08 LAB — BASIC METABOLIC PANEL
Anion gap: 9 (ref 5–15)
BUN: 9 mg/dL (ref 8–23)
CO2: 27 mmol/L (ref 22–32)
Calcium: 9.5 mg/dL (ref 8.9–10.3)
Chloride: 103 mmol/L (ref 98–111)
Creatinine, Ser: 0.6 mg/dL (ref 0.44–1.00)
GFR, Estimated: >60 mL/min (ref >60)
Glucose, Bld: 142 mg/dL — ABNORMAL HIGH (ref 70–99)
Potassium: 4 mmol/L (ref 3.5–5.1)
Sodium: 139 mmol/L (ref 135–145)

## 2021-04-08 MED ORDER — ATORVASTATIN CALCIUM 40 MG PO TABS: 40.0000 mg | ORAL_TABLET | Freq: Every day | ORAL | 0 refills | Status: DC

## 2021-04-08 MED ORDER — ASPIRIN 81 MG PO TBEC: 81.0000 mg | DELAYED_RELEASE_TABLET | Freq: Every day | ORAL | 0 refills | Status: DC

## 2021-04-09 LAB — IGG CSF INDEX
Albumin CSF-mCnc: 61 mg/dL — ABNORMAL HIGH (ref 8–37)
Albumin: 4.7 g/dL (ref 3.8–4.8)
CSF IgG Index: 1.1 — ABNORMAL HIGH (ref 0.0–0.7)
IgG (Immunoglobin G), Serum: 1845 mg/dL — ABNORMAL HIGH (ref 586–1602)
IgG, CSF: 27.2 mg/dL — ABNORMAL HIGH (ref 0.0–6.7)
IgG/Alb Ratio, CSF: 0.45 — ABNORMAL HIGH (ref 0.00–0.25)

## 2021-04-10 LAB — OLIGOCLONAL BANDS, CSF + SERM

## 2021-04-18 NOTE — Discharge Summary (Signed)
Physician Discharge Summary  Jacqueline Fox FYB:[id number] DOB: 1955/09/09 DOA: 04/03/2021  PCP: Mack Hook, MD  Admit date: 04/03/2021 Discharge date: 04/08/2021  Time spent: 35 minutes  Recommendations for Outpatient Follow-up:  Ophthalmology Dr. Winfield Rast on 11/29 Outpatient neurology, referral sent-follow-up CSF IgG index and oligoclonal bands   Discharge Diagnoses:  Principal Problem:   Optic neuritis Active Problems:   Vision loss of left eye History of hypertension History of discoid lupus  Discharge Condition: Stable  Diet recommendation: Heart healthy  Filed Weights   04/03/21 1228  Weight: 67.1 kg    History of present illness:  Jacqueline Fox is a 65 y.o. 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, temp with gradual loss of left eye vision X 1 week  -Gradually clear progressed from floaters and discomfort in left eye to complete blindness -Subsequently seen by retinal specialist, exam was suspicious for optic neuritis versus ischemic optic neuropathy versus GCA and sent to the ED --MRI T2 signal abnormality on left optic nerve with out significant inflammation changes in the surrounding flat, findings suggestive of optic neuritis with differential including optic nerve ischemia  Hospital Course:   Left eye vision loss Optic neuritis versus ischemic optic neuropathy versus giant cell arteritis -ESR was 45 -Neurology consulted, LP completed in radiology, more consistent with a hemorrhagic tap, follow-up CSF IgG index and CSF oligoclonal bands-still pending -Treated with high-dose pulse IV steroids -Ophthalmology note from 11/22 reviewed, did not have edema of the optic disc -At the time of discharge she was starting to see some shadows and flashes of light through her left eye. -Neurology sent referral for close follow-up, she will also follow-up with ophthalmology in 2 days i.e. Tuesday   HTN -Continue  Amlodipine.   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  Consultants:  Neurology   Procedures: Lumbar puncture 11/23  Discharge Exam: Vitals:   04/08/21 0512 04/08/21 0732  BP: (!) 109/56 114/77  Pulse: 60 62  Resp: 17 17  Temp: 97.6 F (36.4 C) 97.9 F (36.6 C)  SpO2: 100% 100%   Gen: Awake, Alert, Oriented X 3,  HEENT: no JVD, left eye vision loss Lungs: Good air movement bilaterally, CTAB CVS: S1S2/RRR Abd: soft, Non tender, non distended, BS present Extremities: No edema Skin: no new rashes on exposed skin  Discharge Instructions   Discharge Instructions     Ambulatory referral to Neurology   Complete by: As directed    An appointment is requested in approximately: 4 wks   Diet - low sodium heart healthy   Complete by: As directed    Increase activity slowly   Complete by: As directed    No dressing needed   Complete by: As directed       Allergies as of 04/08/2021   No Known Allergies      Medication List     TAKE these medications    amLODipine 10 MG tablet Commonly known as: NORVASC Take 1 tablet by mouth daily   aspirin 81 MG EC tablet Take 1 tablet (81 mg total) by mouth daily. Swallow whole.   atorvastatin 40 MG tablet Commonly known as: LIPITOR Take 1 tablet (40 mg total) by mouth daily.   calcium citrate-vitamin D 500-500 MG-UNIT chewable tablet 1 tab by mouth twice daily   Fluocinolone Acetonide Scalp 0.01 % Oil Commonly known as: Derma-Smoothe/FS Scalp Apply to scalp at bedtime as per instructions   multivitamin tablet Take 1 tablet by mouth daily.  triamcinolone 0.025 % ointment Commonly known as: KENALOG Apply scant amount to affected areas once daily as needed. What changed:  how much to take how to take this additional instructions               Discharge Care Instructions  (From admission, onward)           Start     Ordered   04/08/21 0000  No dressing needed        04/08/21 1015            No Known Allergies  Follow-up Information     Neurology Follow up.   Why: Office will call you next week        Princess Bruins, MD Follow up on 04/10/2021.   Specialty: Ophthalmology Contact information: [place] [address] [phone]                  The results of significant diagnostics from this hospitalization (including imaging, microbiology, ancillary and laboratory) are listed below for reference.    Significant Diagnostic Studies: CT ANGIO HEAD NECK W WO CM  Result Date: 04/03/2021 CLINICAL DATA:  Monocular vision loss EXAM: CT ANGIOGRAPHY HEAD AND NECK TECHNIQUE: Multidetector CT imaging of the head and neck was performed using the standard protocol during bolus administration of intravenous contrast. Multiplanar CT image reconstructions and MIPs were obtained to evaluate the vascular anatomy. Carotid stenosis measurements (when applicable) are obtained utilizing NASCET criteria, using the distal internal carotid diameter as the denominator. CONTRAST:  18m OMNIPAQUE IOHEXOL 350 MG/ML SOLN COMPARISON:  Same-day brain MRI FINDINGS: CT HEAD FINDINGS Brain: There is no evidence of acute intracranial hemorrhage, extra-axial fluid collection, or acute infarct. Parenchymal volume is normal. The ventricles are normal in size. Scattered foci of hypodensity in the subcortical and periventricular white matter are noted corresponding to FLAIR signal abnormality seen on the same-day brain MRI. There is no solid mass lesion.  There is no midline shift. Vascular: See below Skull: Normal. Negative for fracture or focal lesion. Sinuses: The imaged paranasal sinuses are clear. Orbits: The globes and orbits are unremarkable, though better assessed on the same day orbital MRI. Review of the MIP images confirms the above findings CTA NECK FINDINGS Aortic arch: Standard branching. Imaged portion shows no evidence of aneurysm or dissection. No significant  stenosis of the major arch vessel origins. Right carotid system: There is mild soft and calcified atherosclerotic plaque at the proximal right internal carotid artery without hemodynamically significant stenosis or occlusion. There is no dissection or aneurysm. The right common and external carotid arteries are patent. Left carotid system: There is mild soft and calcified atherosclerotic plaque at the proximal left internal carotid artery without hemodynamically significant stenosis or occlusion. There is no dissection or aneurysm. The left common and external carotid arteries are patent. Vertebral arteries: The vertebral arteries are patent, without hemodynamically significant stenosis, occlusion, dissection, or aneurysm. Skeleton: There is no acute osseous abnormality or aggressive osseous lesion. There is no visible canal hematoma. Other neck: There is a subcentimeter left thyroid nodule. The soft tissues are otherwise unremarkable. Upper chest: The imaged lung apices are clear. Review of the MIP images confirms the above findings CTA HEAD FINDINGS Anterior circulation: The cavernous ICAs are patent. The ophthalmic arteries are identified. The bilateral MCAs and ACAs are patent. There is no aneurysm. Posterior circulation: The bilateral V4 segments are patent. The bilateral PCAs are patent. The posterior communicating arteries are not  identified. There is no aneurysm. Venous sinuses: As permitted by contrast timing, patent. Anatomic variants: None. Review of the MIP images confirms the above findings IMPRESSION: 1. No acute intracranial pathology. 2. Mild calcified atherosclerotic plaque in the bilateral carotid bulbs without hemodynamically significant stenosis or occlusion. Otherwise, patent vasculature of the head and neck. Electronically Signed   By: Valetta Mole M.D.   On: 04/03/2021 16:04   MR BRAIN W WO CONTRAST  Result Date: 04/03/2021 CLINICAL DATA:  Loss of vision in left eye, suspect optic  neuritis versus stroke EXAM: MRI HEAD AND ORBITS WITHOUT AND WITH CONTRAST TECHNIQUE: Multiplanar, multiecho pulse sequences of the brain and surrounding structures were obtained without and with intravenous contrast. Multiplanar, multiecho pulse sequences of the orbits and surrounding structures were obtained including fat saturation techniques, before and after intravenous contrast administration. CONTRAST:  6.31m GADAVIST GADOBUTROL 1 MMOL/ML IV SOLN COMPARISON:  CT head 04/28/2006 FINDINGS: MRI HEAD FINDINGS Brain: There is no evidence of acute intracranial hemorrhage, extra-axial fluid collection, or acute infarct. There are multiple foci of FLAIR signal abnormality throughout the subcortical and periventricular white matter, nonspecific. There is no associated enhancement or diffusion restriction. The ventricles are normal in size. There is no solid mass lesion. There is no midline shift. Vascular: Normal flow voids. Skull and upper cervical spine: Normal marrow signal. Other: None. MRI ORBITS FINDINGS Orbits: There is mild increased T2 signal in the left optic nerve with associated pain enhancement. There is diffusion restriction along the left optic nerve. There is no significant inflammatory change in the surrounding orbital fat. The left globe is unremarkable. The left extraocular muscles are normal. The right globe, orbit, and optic nerve are normal. Visualized sinuses: The paranasal sinuses are clear. Soft tissues: Unremarkable. IMPRESSION: 1. Abnormal T2 signal and feint enhancement and possible diffusion restriction in the left optic nerve, without significant inflammatory change in the surrounding fat. Findings are suggestive of optic neuritis with differential including optic nerve ischemia. 2. Patchy foci of FLAIR signal abnormality in the subcortical and periventricular white matter are nonspecific. These most commonly reflect sequela of chronic white matter microangiopathy, with differential  including demyelination. Electronically Signed   By: PValetta MoleM.D.   On: 04/03/2021 15:33   MR CERVICAL SPINE W WO CONTRAST  Result Date: 04/04/2021 CLINICAL DATA:  C-spine CSF leak suspected EXAM: MRI CERVICAL SPINE WITHOUT AND WITH CONTRAST TECHNIQUE: Multiplanar and multiecho pulse sequences of the cervical spine, to include the craniocervical junction and cervicothoracic junction, were obtained without and with intravenous contrast. CONTRAST:  6.573mGADAVIST GADOBUTROL 1 MMOL/ML IV SOLN COMPARISON:  CTA neck 04/03/2021 FINDINGS: Alignment: Normal. Vertebrae: Vertebral body heights are preserved. There is no marrow signal abnormality. There is no abnormal marrow enhancement. Cord/spinal canal: Normal in signal and morphology. There is no abnormal enhancement in the cord. There is no abnormal fluid collection in the spinal canal to suggest a source of a CSF leak. Posterior Fossa, vertebral arteries, paraspinal tissues: The imaged posterior fossa is unremarkable. The paraspinal soft tissues are unremarkable. The vertebral artery flow voids are present. Disc levels: The disc heights are overall preserved. There is mild facet arthropathy in the cervical spine with trace fluid on the left at C2-C3 and C3-C4. C2-C3: No significant spinal canal or neural foraminal stenosis. C3-C4: No significant spinal canal or neural foraminal stenosis. C4-C5: There is mild uncovertebral and facet arthropathy resulting in mild left and no significant right neural foraminal stenosis and no significant spinal canal stenosis. C5-C6: There  is mild uncovertebral and facet arthropathy resulting in mild left and no significant right neural foraminal stenosis and no significant spinal canal stenosis. C6-C7: There is a mild posterior disc osteophyte complex and mild uncovertebral and facet arthropathy resulting in mild left and no significant right neural foraminal stenosis and no significant spinal canal stenosis. C7-T1: No  significant spinal canal or neural foraminal stenosis. Other: There is a subcentimeter left thyroid nodule. IMPRESSION: 1. No acute findings in the cervical spine. No abnormal fluid collection to suggest a source of CSF leak. 2. Mild degenerative changes as above without high-grade spinal canal or neural foraminal stenosis. 3. No evidence of demyelination in the cervical spine. Electronically Signed   By: Valetta Mole M.D.   On: 04/04/2021 16:57   MR THORACIC SPINE W WO CONTRAST  Result Date: 04/04/2021 CLINICAL DATA:  Acute myelopathy. Rule out CSF leak. Suspect intracranial hypotension. EXAM: MRI THORACIC WITHOUT AND WITH CONTRAST TECHNIQUE: Multiplanar and multiecho pulse sequences of the thoracic spine were obtained without and with intravenous contrast. CONTRAST:  6.68m GADAVIST GADOBUTROL 1 MMOL/ML IV SOLN COMPARISON:  None. FINDINGS: Alignment:  Normal Vertebrae: Negative for fracture or mass Cord: Cord signal morphology normal. Negative for cord compression. Paraspinal and other soft tissues: Minimal left pleural effusion. No fluid collection identified. Disc levels: Mild disc degeneration in the thoracic spine with mild disc space narrowing and minimal spurring. No significant stenosis. IMPRESSION: Mild thoracic disc degeneration.  No stenosis or cord compression Negative for paraspinous fluid collection. Minimal left pleural effusion. Electronically Signed   By: CFranchot GalloM.D.   On: 04/04/2021 16:54   MM 3D SCREEN BREAST BILATERAL  Result Date: 03/30/2021 CLINICAL DATA:  Screening. EXAM: DIGITAL SCREENING BILATERAL MAMMOGRAM WITH TOMOSYNTHESIS AND CAD TECHNIQUE: Bilateral screening digital craniocaudal and mediolateral oblique mammograms were obtained. Bilateral screening digital breast tomosynthesis was performed. The images were evaluated with computer-aided detection. COMPARISON:  Previous exam(s). ACR Breast Density Category b: There are scattered areas of fibroglandular density.  FINDINGS: There are no findings suspicious for malignancy. IMPRESSION: No mammographic evidence of malignancy. A result letter of this screening mammogram will be mailed directly to the patient. RECOMMENDATION: Screening mammogram in one year. (Code:SM-B-01Y) BI-RADS CATEGORY  1: Negative. Electronically Signed   By: TZerita BoersM.D.   On: 03/30/2021 16:21   ECHOCARDIOGRAM COMPLETE BUBBLE STUDY  Result Date: 04/04/2021    ECHOCARDIOGRAM REPORT   [facility] Medical Rec #:  [id number]       Height:       64.0 in Accession #:    [id number]      Weight:       148.0 lb Date of Birth:  [birth year]       BSA:          1.721 m Patient Age:    89years         BP:           142/71 mmHg Patient Gender: F                HR:           84 bpm. Exam Location:  Inpatient Procedure: 2D Echo, Cardiac Doppler, Color Doppler and Saline Contrast Bubble            Study Indications:    TIA  History:        Patient has no prior history of Echocardiogram examinations.  Risk Factors:Hypertension and Hyperlipidemia.  Sonographer:    Glo Herring Referring Phys: [id number] [place]  1. Left ventricular ejection fraction, by estimation, is 60 to 65%. The left ventricle has normal function. The left ventricle has no regional wall motion abnormalities. There is mild left ventricular hypertrophy. Left ventricular diastolic parameters were normal.  2. Right ventricular systolic function is normal. The right ventricular size is normal. Tricuspid regurgitation signal is inadequate for assessing PA pressure.  3. The mitral valve is normal in structure. No evidence of mitral valve regurgitation.  4. The aortic valve was not well visualized. Aortic valve regurgitation is not visualized. No aortic stenosis is present.  5. The inferior vena cava is normal in size with <50% respiratory variability, suggesting right atrial pressure of 8 mmHg.  6. Agitated saline contrast  bubble study was negative, with no evidence of any interatrial shunt. Technically difficult study, but no shunting seen FINDINGS  Left Ventricle: Left ventricular ejection fraction, by estimation, is 60 to 65%. The left ventricle has normal function. The left ventricle has no regional wall motion abnormalities. The left ventricular internal cavity size was normal in size. There is  mild left ventricular hypertrophy. Left ventricular diastolic parameters were normal. Right Ventricle: The right ventricular size is normal. No increase in right ventricular wall thickness. Right ventricular systolic function is normal. Tricuspid regurgitation signal is inadequate for assessing PA pressure. Left Atrium: Left atrial size was normal in size. Right Atrium: Right atrial size was not well visualized. Pericardium: Trivial pericardial effusion is present. Mitral Valve: The mitral valve is normal in structure. No evidence of mitral valve regurgitation. Tricuspid Valve: The tricuspid valve is normal in structure. Tricuspid valve regurgitation is not demonstrated. Aortic Valve: The aortic valve was not well visualized. Aortic valve regurgitation is not visualized. No aortic stenosis is present. Aortic valve mean gradient measures 4.0 mmHg. Aortic valve peak gradient measures 8.1 mmHg. Aortic valve area, by VTI measures 2.39 cm. Pulmonic Valve: The pulmonic valve was grossly normal. Pulmonic valve regurgitation is trivial. Aorta: The aortic root and ascending aorta are structurally normal, with no evidence of dilitation. Venous: The inferior vena cava is normal in size with less than 50% respiratory variability, suggesting right atrial pressure of 8 mmHg. IAS/Shunts: The interatrial septum was not well visualized. Agitated saline contrast was given intravenously to evaluate for intracardiac shunting. Agitated saline contrast bubble study was negative, with no evidence of any interatrial shunt.  LEFT VENTRICLE PLAX 2D LVIDd:          3.90 cm     Diastology LVIDs:         2.50 cm     LV e' medial:    8.05 cm/s LV PW:         1.10 cm     LV E/e' medial:  9.4 LV IVS:        1.10 cm     LV e' lateral:   11.50 cm/s LVOT diam:     1.90 cm     LV E/e' lateral: 6.5 LV SV:         64 LV SV Index:   37 LVOT Area:     2.84 cm  LV Volumes (MOD) LV vol d, MOD A2C: 59.3 ml LV vol d, MOD A4C: 95.3 ml LV vol s, MOD A2C: 27.7 ml LV vol s, MOD A4C: 40.5 ml LV SV MOD A2C:     31.6 ml LV SV MOD A4C:  95.3 ml LV SV MOD BP:      42.7 ml IVC IVC diam: 1.90 cm LEFT ATRIUM             Index LA diam:        3.20 cm 1.86 cm/m LA Vol (A2C):   45.3 ml 26.32 ml/m LA Vol (A4C):   33.7 ml 19.58 ml/m LA Biplane Vol: 39.3 ml 22.83 ml/m  AORTIC VALVE                    PULMONIC VALVE AV Area (Vmax):    2.12 cm     PV Vmax:       1.05 m/s AV Area (Vmean):   2.10 cm     PV Peak grad:  4.4 mmHg AV Area (VTI):     2.39 cm AV Vmax:           142.00 cm/s AV Vmean:          92.700 cm/s AV VTI:            0.267 m AV Peak Grad:      8.1 mmHg AV Mean Grad:      4.0 mmHg LVOT Vmax:         106.00 cm/s LVOT Vmean:        68.700 cm/s LVOT VTI:          0.225 m LVOT/AV VTI ratio: 0.84  AORTA Ao Root diam: 2.70 cm Ao Asc diam:  3.10 cm MITRAL VALVE MV Area (PHT): 4.06 cm     SHUNTS MV Decel Time: 187 msec     Systemic VTI:  0.22 m MV E velocity: 75.30 cm/s   Systemic Diam: 1.90 cm MV A velocity: 111.00 cm/s MV E/A ratio:  0.68 Oswaldo Milian MD Electronically signed by Oswaldo Milian MD Signature Date/Time: 04/04/2021/2:30:09 PM    Final    DG FL GUIDED LUMBAR PUNCTURE  Result Date: 04/04/2021 CLINICAL DATA:  Patient with loss of vision in left eye, normal pressure hydrocephalus. Referral for diagnostic lumbar puncture. EXAM: DIAGNOSTIC LUMBAR PUNCTURE UNDER FLUOROSCOPIC GUIDANCE COMPARISON:  None FLUOROSCOPY TIME:  Fluoroscopy Time:  54 seconds Radiation Exposure Index (if provided by the fluoroscopic device): 4.8 mGy Number of Acquired Spot Images: 1 PROCEDURE:  Informed consent was obtained from the patient prior to the procedure, including potential complications of headache, allergy, and pain. With the patient prone, the lower back was prepped with Betadine. 1% Lidocaine was used for local anesthesia. Lumbar puncture was performed at the L4-5 level using a 18 gauge needle with return of light pink CSF with an opening pressure of 10 cm water. 9 ml of CSF were obtained for laboratory studies. The patient tolerated the procedure well and there were no apparent complications. IMPRESSION: Successful lumbar puncture at the L4-5 level under fluoroscopic guidance. Read by Candiss Norse, PA-C Electronically Signed   By: Marlaine Hind M.D.   On: 04/04/2021 10:24   MR ORBITS W WO CONTRAST  Result Date: 04/03/2021 CLINICAL DATA:  Loss of vision in left eye, suspect optic neuritis versus stroke EXAM: MRI HEAD AND ORBITS WITHOUT AND WITH CONTRAST TECHNIQUE: Multiplanar, multiecho pulse sequences of the brain and surrounding structures were obtained without and with intravenous contrast. Multiplanar, multiecho pulse sequences of the orbits and surrounding structures were obtained including fat saturation techniques, before and after intravenous contrast administration. CONTRAST:  6.40m GADAVIST GADOBUTROL 1 MMOL/ML IV SOLN COMPARISON:  CT head 04/28/2006 FINDINGS: MRI HEAD FINDINGS Brain: There is  no evidence of acute intracranial hemorrhage, extra-axial fluid collection, or acute infarct. There are multiple foci of FLAIR signal abnormality throughout the subcortical and periventricular white matter, nonspecific. There is no associated enhancement or diffusion restriction. The ventricles are normal in size. There is no solid mass lesion. There is no midline shift. Vascular: Normal flow voids. Skull and upper cervical spine: Normal marrow signal. Other: None. MRI ORBITS FINDINGS Orbits: There is mild increased T2 signal in the left optic nerve with associated pain enhancement.  There is diffusion restriction along the left optic nerve. There is no significant inflammatory change in the surrounding orbital fat. The left globe is unremarkable. The left extraocular muscles are normal. The right globe, orbit, and optic nerve are normal. Visualized sinuses: The paranasal sinuses are clear. Soft tissues: Unremarkable. IMPRESSION: 1. Abnormal T2 signal and feint enhancement and possible diffusion restriction in the left optic nerve, without significant inflammatory change in the surrounding fat. Findings are suggestive of optic neuritis with differential including optic nerve ischemia. 2. Patchy foci of FLAIR signal abnormality in the subcortical and periventricular white matter are nonspecific. These most commonly reflect sequela of chronic white matter microangiopathy, with differential including demyelination. Electronically Signed   By: Valetta Mole M.D.   On: 04/03/2021 15:33    Microbiology: No results found for this or any previous visit (from the past 240 hour(s)).   Labs: Basic Metabolic Panel: No results for input(s): NA, K, CL, CO2, GLUCOSE, BUN, CREATININE, CALCIUM, MG, PHOS in the last 168 hours. Liver Function Tests: No results for input(s): AST, ALT, ALKPHOS, BILITOT, PROT, ALBUMIN in the last 168 hours. No results for input(s): LIPASE, AMYLASE in the last 168 hours. No results for input(s): AMMONIA in the last 168 hours. CBC: No results for input(s): WBC, NEUTROABS, HGB, HCT, MCV, PLT in the last 168 hours. Cardiac Enzymes: No results for input(s): CKTOTAL, CKMB, CKMBINDEX, TROPONINI in the last 168 hours. BNP: BNP (last 3 results) No results for input(s): BNP in the last 8760 hours.  ProBNP (last 3 results) No results for input(s): PROBNP in the last 8760 hours.  CBG: No results for input(s): GLUCAP in the last 168 hours.     Signed:  Domenic Polite MD.  [facility] 04/18/2021, 3:35 PM

## 2021-05-13 DIAGNOSIS — M316 Other giant cell arteritis: Secondary | ICD-10-CM

## 2021-05-13 HISTORY — PX: TEMPORAL ARTERY BIOPSY / LIGATION: SUR132

## 2021-05-13 HISTORY — DX: Other giant cell arteritis: M31.6

## 2021-05-15 ENCOUNTER — Encounter: Payer: Self-pay | Admitting: Neurology

## 2021-05-15 ENCOUNTER — Other Ambulatory Visit: Payer: Self-pay

## 2021-05-15 ENCOUNTER — Ambulatory Visit (INDEPENDENT_AMBULATORY_CARE_PROVIDER_SITE_OTHER): Payer: Self-pay | Admitting: Neurology

## 2021-05-15 VITALS — BP 128/86 | HR 82 | Ht 63.0 in | Wt 148.0 lb

## 2021-05-15 DIAGNOSIS — M316 Other giant cell arteritis: Secondary | ICD-10-CM | POA: Insufficient documentation

## 2021-05-15 DIAGNOSIS — Z7952 Long term (current) use of systemic steroids: Secondary | ICD-10-CM

## 2021-05-15 DIAGNOSIS — H5462 Unqualified visual loss, left eye, normal vision right eye: Secondary | ICD-10-CM

## 2021-05-15 NOTE — Progress Notes (Signed)
[facility]  PATIENT: Jacqueline Fox DOB: 04-Apr-1956  REFERRING DOCTOR OR PCP: Su Monks, MD SOURCE: Patient, notes from primary care, notes from recent emergency room visit, laboratory and imaging reports, MRI images personally reviewed.  _________________________________   HISTORICAL  CHIEF COMPLAINT:  Chief Complaint  Patient presents with   New Patient (Initial Visit)    Pt alone, rm 2  Presents today as a consult for recently vision loss. November went to ER with loss of vision on the left eye. She had a temopral biopsy completed to assess for temopral arteritis on left side. She brought results  She is following with eye specialist as well.    HISTORY OF PRESENT ILLNESS:  I had the pleasure of seeing patient, Franshesca Chipman, at [facility] for neurologic consultation regarding her left visual loss and possible temporal arteritis and abnormal MRI.  She is a 66 year old woman who had the onset of painless visual loss 04/03/2021.   She woe up that day with complete loss of vision on the left but had noted mild visual blurriness the previous day.    At the time, she had no vision OS.   She went to the ED and had MRI of the brain and orbits.   THe orbits showed abnormal changes in the left optic nerve c/w optic neuritis or possibly optic nerve ischemia.   Bloodwork showed an elevated ESR though CRP was normal.   She had a LP showing no evidence of OCB.   Temporal artery biopsy showed healed inflammatory changes worrisome for temporal arteritis.   She was treated with IV Solumedrol and then started on 60 mg prednisone daily.    She is tolerating the steroid well and vision has mildly improved (to finger counting but not reading large print).      She denies headache or jaw claudication.    She had no episodes of numbness, weakness or clumsiness in the past.      She had a temporal artery biopsy 04/25/2021.  The pathologic results were  consistent with healed temporal arteritis:  "There is focal scarring of the temporal artery, centered around the internal elastic lamina, including associated patchy loss of the elastic fibrils.  There is focal chronic inflammation around small arterioles in the adventitia around the temporal artery.  There is no evidence of active inflammation in the wall of the temporal artery."  Data: Imaging:  MRI of the brain and orbits 04/03/2021 showed abnormal signal and mild diffusion restriction of the left optic nerve.  There were some scattered T2/FLAIR hyperintense foci in the hemispheres.  These are nonspecific and could be due to either chronic microvascular ischemic change or demyelination.  MRI of the cervical and thoracic spine 04/04/2022 showed normal spinal cord.  There were mild degenerative changes, C6-C7 being the worst level, but no spinal stenosis or nerve root compression.  Labwork Lab work 02/13/2021 showed a slightly reduced vitamin D (28.3)  Lab work 04/03/2021 showed elevated ESR (44 mm/h).  CRP was normal.  HIV was negative.  Hemoglobin A1c was negative.   The urine tox screen was positive for THC.  She had a lumbar puncture 04/04/2021.  No oligoclonal bands were noted.  However, the IgG index was elevated.  She had elevated IgG in both the serum and the CSF.  Total protein was elevated at 135.  It appears as though she had a "bloody tap" with greater than 6000 white blood cells.  There were only 3 white  blood cells.  Pathology She had a temporal artery biopsy 04/25/2021.  The pathologic results were consistent with healed temporal arteritis:  "There is focal scarring of the temporal artery, centered around the internal elastic lamina, including associated patchy loss of the elastic fibrils.  There is focal chronic inflammation around small arterioles in the adventitia around the temporal artery.  There is no evidence of active inflammation in the wall of the temporal artery."  REVIEW  OF SYSTEMS: Constitutional: No fevers, chills, sweats, or change in appetite Eyes: No visual changes, double vision, eye pain Ear, nose and throat: No hearing loss, ear pain, nasal congestion, sore throat Cardiovascular: No chest pain, palpitations Respiratory:  No shortness of breath at rest or with exertion.   No wheezes GastrointestinaI: No nausea, vomiting, diarrhea, abdominal pain, fecal incontinence Genitourinary:  No dysuria, urinary retention or frequency.  No nocturia. Musculoskeletal:  No neck pain, back pain Integumentary: No rash, pruritus, skin lesions Neurological: as above Psychiatric: No depression at this time.  No anxiety Endocrine: No palpitations, diaphoresis, change in appetite, change in weigh or increased thirst Hematologic/Lymphatic:  No anemia, purpura, petechiae. Allergic/Immunologic: No itchy/runny eyes, nasal congestion, recent allergic reactions, rashes  ALLERGIES: No Known Allergies  HOME MEDICATIONS:  Current Outpatient Medications:    amLODipine (NORVASC) 10 MG tablet, Take 1 tablet by mouth daily (Patient taking differently: Take 10 mg by mouth daily.), Disp: 30 tablet, Rfl: 11   aspirin EC 81 MG EC tablet, Take 1 tablet (81 mg total) by mouth daily. Swallow whole., Disp: 30 tablet, Rfl: 0   atorvastatin (LIPITOR) 40 MG tablet, Take 1 tablet (40 mg total) by mouth daily., Disp: 30 tablet, Rfl: 0   calcium citrate-vitamin D 500-500 MG-UNIT chewable tablet, 1 tab by mouth twice daily, Disp: , Rfl:    Fluocinolone Acetonide Scalp (DERMA-SMOOTHE/FS SCALP) 0.01 % OIL, Apply to scalp at bedtime as per instructions, Disp: 118 mL, Rfl: 4   Multiple Vitamin (MULTIVITAMIN) tablet, Take 1 tablet by mouth daily., Disp: , Rfl:    triamcinolone (KENALOG) 0.025 % ointment, Apply scant amount to affected areas once daily as needed. (Patient taking differently: Apply 1 application topically. Apply 1 application to body once daily as needed.), Disp: 80 g, Rfl: 2    predniSONE (DELTASONE) 10 MG tablet, Take 60 mg by mouth daily., Disp: , Rfl:   PAST MEDICAL HISTORY: Past Medical History:  Diagnosis Date   Discoid lupus    Follows with Derm   Family history of breast cancer    Family history of cholangiocarcinoma    Sister   Family history of colon cancer    Patient has had genetic testing and not found to have genetic predisposition.  Recommendations for cancer screenings as usual per Tamsen Meek her note 2019 please   Family history of lung cancer    Brother   Family history of prostate cancer    Genetic testing 11/27/2017   Negative genetic testing on the Multicancer panel.  The Multi-Gene Panel offered by [facility] includes sequencing and/or deletion duplication testing of the following 84 genes: AIP, ALK, APC, ATM, AXIN2,BAP1,  BARD1, BLM, BMPR1A, BRCA1, BRCA2, BRIP1, CASR, CDC73, CDH1, CDK4, CDKN1B, CDKN1C, CDKN2A (p14ARF), CDKN2A (p16INK4a), CEBPA, CHEK2, CTNNA1, DICER1, DIS3L2, EGFR (c.2369C>T, p.Thr790Met variant   Hyperlipidemia    Hypertension     PAST SURGICAL HISTORY: Past Surgical History:  Procedure Laterality Date   COLONOSCOPY WITH PROPOFOL N/A 02/12/2018   Procedure: COLONOSCOPY WITH PROPOFOL;  Surgeon: Ronald Lobo, MD;  Location:  [facility];  Service: Endoscopy;  Laterality: N/A;    FAMILY HISTORY: Family History  Problem Relation Age of Onset   Diabetes Mother    Heart disease Mother        CHF   Hypertension Mother    Kidney disease Mother    Arthritis Mother        OA   Dementia Mother    Cancer Father 32       colon and prostate   Cancer Sister 58       ?cholangiocarcinoma   Cancer Brother 70       lung--smoker  also, prostate CA when 47-52 yo   Kidney disease Son        Stage IV CKD from poorly controlled Htn   Hypertension Son    Cancer Brother 57       prostate   Cancer Brother 91       Prostate--treated successfuly   Cancer Other 47       colon--sister's daughter (sister with "gallbladder  duct" cancer)   Cancer Paternal Aunt 91       Breast Cancer   Breast cancer Paternal Aunt    Cancer Paternal Aunt        Bone cancer   Stroke Paternal Grandmother    Prostate cancer Cousin        mat first cousin    SOCIAL HISTORY:  Social History   Socioeconomic History   Marital status: Soil scientist    Spouse name: Serita Kyle   Number of children: 4   Years of education: 14   Highest education level: Associate degree: academic program  Occupational History   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  Vaping Use   Vaping Use: Never used  Substance and Sexual Activity   Alcohol use: Yes    Comment: Bottle of wine over 3 day weekend.     Drug use: Yes    Types: Marijuana    Comment: edibles and smoking.   Sexual activity: Not Currently  Other Topics Concern   Not on file  Social History Narrative   Has been together with female partner for 28 years (2022).   He does not smoke.     Currently just lives with her partner, [place]   Social Determinants of Health   Financial Resource Strain: Low Risk    Difficulty of Paying Living Expenses: Not hard at all  Food Insecurity: No Food Insecurity   Worried About Charity fundraiser in the Last Year: Never true   Arboriculturist in the Last Year: Never true  Transportation Needs: No Transportation Needs   Lack of Transportation (Medical): No   Lack of Transportation (Non-Medical): No  Physical Activity: Not on file  Stress: Not on file  Social Connections: Not on file  Intimate Partner Violence: Not At Risk   Fear of Current or Ex-Partner: No   Emotionally Abused: No   Physically Abused: No   Sexually Abused: No     PHYSICAL EXAM  Vitals:    05/15/21 1308  BP: 128/86  Pulse: 82  Weight: 148 lb (67.1  kg)  Height: '5\' 3"'  (1.6 m)    Body mass index is 26.22 kg/m.   General: The patient is well-developed and well-nourished and in no acute distress  HEENT:  Head is [place]/AT.  Sclera are anicteric.  Funduscopic exam shows normal optic discs without edema.  ? Reduced arterial vasculature OS.    Neck: No carotid bruits are noted.  The neck is nontender.  Cardiovascular: The heart has a regular rate and rhythm with a normal S1 and S2. There were no murmurs, gallops or rubs.    Skin: Extremities are without rash or  edema.  Musculoskeletal:  Back is nontender  Neurologic Exam  Mental status: The patient is alert and oriented x 3 at the time of the examination. The patient has apparent normal recent and remote memory, with an apparently normal attention span and concentration ability.   Speech is normal.  Cranial nerves: Extraocular movements are full. Pupils show left APD.  Visual fields are full OD.  FInger counting nly OS and good VA OD.  Facial symmetry is present. There is good facial sensation to soft touch bilaterally.Facial strength is normal.  Trapezius and sternocleidomastoid strength is normal. No dysarthria is noted.  The tongue is midline, and the patient has symmetric elevation of the soft palate. No obvious hearing deficits are noted.  Motor:  Muscle bulk is normal.   Tone is normal. Strength is  5 / 5 in all 4 extremities.   Sensory: Sensory testing is intact to pinprick, soft touch and vibration sensation in all 4 extremities.  Coordination: Cerebellar testing reveals good finger-nose-finger and heel-to-shin bilaterally.  Gait and station: Station is normal.   Gait is normal. Tandem gait is normal. Romberg is negative.   Reflexes: Deep tendon reflexes are symmetric and normal bilaterally.   Plantar responses are flexor.    DIAGNOSTIC DATA (LABS, IMAGING, TESTING) - I reviewed patient records, labs, notes,  testing and imaging myself where available.  Lab Results  Component Value Date   WBC 9.3 04/07/2021   HGB 12.6 04/07/2021   HCT 36.9 04/07/2021   MCV 93.9 04/07/2021   PLT 250 04/07/2021      Component Value Date/Time   NA 139 04/08/2021 0225   NA 142 02/13/2021 0915   K 4.0 04/08/2021 0225   CL 103 04/08/2021 0225   CO2 27 04/08/2021 0225   GLUCOSE 142 (H) 04/08/2021 0225   BUN 9 04/08/2021 0225   BUN 7 (L) 02/13/2021 0915   CREATININE 0.60 04/08/2021 0225   CALCIUM 9.5 04/08/2021 0225   PROT 8.5 (H) 04/03/2021 1235   PROT 8.1 02/13/2021 0915   ALBUMIN 4.7 04/04/2021 0925   AST 23 04/03/2021 1235   ALT 20 04/03/2021 1235   ALKPHOS 51 04/03/2021 1235   BILITOT 0.5 04/03/2021 1235   BILITOT 0.4 02/13/2021 0915   GFRNONAA >60 04/08/2021 0225   GFRAA 117 02/21/2020 0900   Lab Results  Component Value Date   CHOL 250 (H) 04/03/2021   HDL 70 04/03/2021   LDLCALC 153 (H) 04/03/2021   LDLDIRECT 158.6 (H) 04/03/2021   TRIG 133 04/03/2021   CHOLHDL 3.6 04/03/2021   Lab Results  Component Value Date   HGBA1C 5.2 04/03/2021        ASSESSMENT AND PLAN  Temporal arteritis (HCC) - Plan: Sedimentation rate, C-reactive protein  Vision loss of left eye - Plan: Sedimentation rate, C-reactive protein  Current chronic use of systemic steroids   In summary, Ms. Kaster is a 66 year old  woman who had the onset of left visual loss 04/03/2021.  MRI of the brain and orbits had shown changes in the left optic nerve consistent with inflammation.  The brain showed nonspecific foci.  Some had characteristics more consistent with chronic microvascular ischemic change and some more consistent with demyelination.  The CSF did not show any evidence of MS.  The temporal artery biopsy was consistent with temporal arteritis and she was started on high-dose steroids.  We discussed prognosis.  Temporal arteritis will usually respond to steroids and the dose can usually be reduced and sometimes  eliminated.  However, there could be a relapse.  She did have mild improvement of visual acuity on the left but is still finger counting only and it is not likely that she will recover to be able to read out of that eye.  I will recheck the ESR today.  If the ESR is still elevated she will continue the 60 mg prednisone for a while.  Otherwise, the prednisone will be tapered as follows: Reduce the dose to 50 mg x 2 weeks followed by 40 mg x 2 weeks followed by 30 mg x 2 weeks followed by 20 mg nightly.  Around that time we will recheck a sed rate.  If it remains low we may titrate the dose further.  She will return to see [place] in about 3 months or sooner if there are new or worsening neurologic symptoms.  Thank you for asking to see Ms. Achey.  Please let me know if I can be of further assistance with her or other patients in the future.   Vasilis Luhman A. Felecia Shelling, MD, [facility] 06/16/2996, 0:69 PM Certified in Neurology, Clinical Neurophysiology, Sleep Medicine and Neuroimaging  [facility] [address] [place], [place] [address] [phone]

## 2021-05-16 LAB — SEDIMENTATION RATE: Sed Rate: 45 mm/hr — ABNORMAL HIGH (ref 0–40)

## 2021-05-16 LAB — C-REACTIVE PROTEIN: CRP: 1 mg/L (ref 0–10)

## 2021-06-18 ENCOUNTER — Other Ambulatory Visit: Payer: Self-pay

## 2021-06-18 MED ORDER — ATORVASTATIN CALCIUM 40 MG PO TABS: 40.0000 mg | ORAL_TABLET | Freq: Every day | ORAL | 7 refills | Status: DC

## 2021-07-31 ENCOUNTER — Other Ambulatory Visit: Payer: Self-pay

## 2021-07-31 ENCOUNTER — Other Ambulatory Visit (INDEPENDENT_AMBULATORY_CARE_PROVIDER_SITE_OTHER): Payer: 59

## 2021-07-31 DIAGNOSIS — E559 Vitamin D deficiency, unspecified: Secondary | ICD-10-CM | POA: Diagnosis not present

## 2021-08-07 ENCOUNTER — Other Ambulatory Visit (INDEPENDENT_AMBULATORY_CARE_PROVIDER_SITE_OTHER): Payer: 59

## 2021-08-07 DIAGNOSIS — Z0000 Encounter for general adult medical examination without abnormal findings: Secondary | ICD-10-CM | POA: Diagnosis not present

## 2021-08-08 LAB — VITAMIN D 25 HYDROXY (VIT D DEFICIENCY, FRACTURES): Vit D, 25-Hydroxy: 25.8 ng/mL — ABNORMAL LOW (ref 30.0–100.0)

## 2021-08-16 ENCOUNTER — Ambulatory Visit: Payer: 59 | Admitting: Internal Medicine

## 2021-09-14 ENCOUNTER — Other Ambulatory Visit: Payer: Self-pay | Admitting: Internal Medicine

## 2021-10-09 ENCOUNTER — Ambulatory Visit (INDEPENDENT_AMBULATORY_CARE_PROVIDER_SITE_OTHER): Payer: 59 | Admitting: Internal Medicine

## 2021-10-09 ENCOUNTER — Encounter: Payer: Self-pay | Admitting: Internal Medicine

## 2021-10-09 VITALS — BP 132/88 | HR 92 | Resp 20 | Ht 63.0 in | Wt 160.5 lb

## 2021-10-09 DIAGNOSIS — Z780 Asymptomatic menopausal state: Secondary | ICD-10-CM

## 2021-10-09 DIAGNOSIS — I10 Essential (primary) hypertension: Secondary | ICD-10-CM | POA: Diagnosis not present

## 2021-10-09 DIAGNOSIS — R7989 Other specified abnormal findings of blood chemistry: Secondary | ICD-10-CM | POA: Insufficient documentation

## 2021-10-09 DIAGNOSIS — E782 Mixed hyperlipidemia: Secondary | ICD-10-CM | POA: Diagnosis not present

## 2021-10-09 DIAGNOSIS — L930 Discoid lupus erythematosus: Secondary | ICD-10-CM

## 2021-10-09 NOTE — Progress Notes (Signed)
Subjective:    Patient ID: Jacqueline Fox, female   DOB: [birth year], 66 y.o.   MRN: [id number]   HPI   Temporal Arteritis with optic neuritis:  Followed at [facility].  Developed vision loss in left eye back in November after last OV here.  Underwent biopsy of left temporal artery.  Now down to prednisone 5 mg until follows up at least to July.  Also, taking Actemra.  Her vision loss remains with lack of color and really just seeing shadows.   2.  Hypertension:  Taking Amlodipine.  Did gain at least 12 lbs with prednisone.  She is doing yard work.  She admits to eating too much.  3.  Low Vitamin D in March.  She went and obtained a different calcium/vitamin D supplement.  Needs recheck in July.  4.  Increased elevation of total and LDL cholesterol, likely with prednisone and Actemra.  She acknowledges need to eat better and is getting back to physical activity now that it is warmer.  Was placed on Atorvastatin in November with the giant cell arteritis.    5.  Discoid Lupus:  stable.  Current Meds  Medication Sig   alendronate (FOSAMAX) 70 MG tablet Take 70 mg by mouth once a week. Take with a full glass of water on an empty stomach.   amLODipine (NORVASC) 10 MG tablet Take 1 tablet by mouth daily (Patient taking differently: Take 10 mg by mouth daily.)   aspirin EC 81 MG EC tablet Take 1 tablet (81 mg total) by mouth daily. Swallow whole.   atorvastatin (LIPITOR) 40 MG tablet Take 1 tablet (40 mg total) by mouth daily.   calcium citrate-vitamin D 500-500 MG-UNIT chewable tablet 1 tab by mouth twice daily   Fluocinolone Acetonide Scalp 0.01 % OIL APPLY TO SCALP AT BEDTIME AS PER INSTRUCTIONS   Multiple Vitamin (MULTIVITAMIN) tablet Take 1 tablet by mouth daily.   Multiple Vitamins-Minerals (PRESERVISION AREDS 2) CAPS Take by mouth 2 (two) times daily.   omeprazole (PRILOSEC) 20 MG capsule Take 20 mg by mouth daily.   predniSONE (DELTASONE) 5 MG tablet Take 5 mg by mouth  daily with breakfast.   Tocilizumab 162 MG/0.9ML SOAJ Inject into the skin once a week.   triamcinolone (KENALOG) 0.025 % ointment Apply scant amount to affected areas once daily as needed. (Patient taking differently: Apply 1 application. topically. Apply 1 application to body once daily as needed.)   No Known Allergies   Review of Systems    Objective:   BP 132/88 (BP Location: Right Arm, Patient Position: Sitting, Cuff Size: Normal)   Pulse 92   Resp 20   Ht 5\' 3"  (1.6 m)   Wt 160 lb 8 oz (72.8 kg)   LMP 02/27/2011   BMI 28.43 kg/m   Physical Exam NAD HEENT:  PERRL, EOMI.  Could not get focused well on eyegrounds. Neck:  Supple No adenopathy Chest:  CTA CV:  RRR without murmur or rub.  Radial and DP pulses normal and equal LE:  No edema.   Assessment & Plan    Giant cell Arteritis:  Actemra and weaning prednisone.  Sed Rate in  April in normal range.    2.  Hypertension:  okay today, but would like to see a bit lower.  Hopefully, she will be able to improve diet a bit and get more physically active with yard work for weight loss and improved BP control.    3.  Low vitamin  D:  recheck level in July.  Is taking Alendronate with long term prednisone.  Does have insurance now and has never had a bone density:  DEXA.    4.  Increasing cholesterol/LDL with start of Actemra/prednisone.  Taking Atorvastatin.  Repeat FLP/hepatic profile in 1 week as nonfasting today.

## 2021-10-16 ENCOUNTER — Other Ambulatory Visit (INDEPENDENT_AMBULATORY_CARE_PROVIDER_SITE_OTHER): Payer: 59

## 2021-10-16 DIAGNOSIS — E782 Mixed hyperlipidemia: Secondary | ICD-10-CM

## 2021-10-17 LAB — LIPID PANEL W/O CHOL/HDL RATIO
Cholesterol, Total: 198 mg/dL (ref 100–199)
HDL: 74 mg/dL (ref >39)
LDL Chol Calc (NIH): 106 mg/dL — ABNORMAL HIGH (ref 0–99)
Triglycerides: 101 mg/dL (ref 0–149)
VLDL Cholesterol Cal: 18 mg/dL (ref 5–40)

## 2021-10-17 LAB — HEPATIC FUNCTION PANEL
ALT: 39 IU/L — ABNORMAL HIGH (ref 0–32)
AST: 34 IU/L (ref 0–40)
Albumin: 4.7 g/dL (ref 3.8–4.8)
Alkaline Phosphatase: 57 IU/L (ref 44–121)
Bilirubin Total: 0.3 mg/dL (ref 0.0–1.2)
Bilirubin, Direct: 0.12 mg/dL (ref 0.00–0.40)
Total Protein: 7.4 g/dL (ref 6.0–8.5)

## 2021-10-28 ENCOUNTER — Other Ambulatory Visit: Payer: Self-pay | Admitting: Internal Medicine

## 2021-10-31 ENCOUNTER — Other Ambulatory Visit: Payer: Self-pay

## 2021-11-14 ENCOUNTER — Other Ambulatory Visit: Payer: 59

## 2021-11-15 ENCOUNTER — Encounter: Payer: Self-pay | Admitting: Neurology

## 2021-11-15 ENCOUNTER — Ambulatory Visit: Payer: 59 | Admitting: Neurology

## 2021-11-15 ENCOUNTER — Other Ambulatory Visit: Payer: 59

## 2021-11-15 VITALS — BP 137/82 | HR 83 | Ht 63.0 in | Wt 162.0 lb

## 2021-11-15 DIAGNOSIS — M316 Other giant cell arteritis: Secondary | ICD-10-CM | POA: Diagnosis not present

## 2021-11-15 DIAGNOSIS — Z7952 Long term (current) use of systemic steroids: Secondary | ICD-10-CM

## 2021-11-15 DIAGNOSIS — H5462 Unqualified visual loss, left eye, normal vision right eye: Secondary | ICD-10-CM

## 2021-11-15 NOTE — Progress Notes (Signed)
[facility]  PATIENT: Jacqueline Fox DOB: February 05, 1960  REFERRING DOCTOR OR PCP: Su Monks, MD SOURCE: Patient, notes from primary care, notes from recent emergency room visit, laboratory and imaging reports, MRI images personally reviewed.  _________________________________   HISTORICAL  CHIEF COMPLAINT:  Chief Complaint  Patient presents with   Follow-up    Rm 1, alone. Pt reports doing well, no change in vision since last OV.     HISTORY OF PRESENT ILLNESS:  Jacqueline Fox, is a 66 y.o. 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  Update 11/15/2021: ESR  (0-40 mm/hr normal) 08/29/2021 = 10 05/15/2021= 45 04/03/21 = 45  She feels vision is unchanged.    No HA or other pain.  She has noticed the left cheek sometimes jumps.   She is taking 5 mg of the steroids daily.   She is tolerating it well for the most part but gained 20 pounds - more stable the past week.      She has not seen ophthalmology recently.   She sees Dr. Tamera Punt ([facility]) at [place] who is managing the prednisone.   He reduced her dose slowly to 5 mg daily   She sees him again in 2 weeks.      History of visual loss/temporal arteritis She is a 66 year old woman who had the onset of painless visual loss 04/03/2021.   She woe up that day with complete loss of vision on the left but had noted mild visual blurriness the previous day.    At the time, she had no vision OS.   She went to the ED and had MRI of the brain and orbits.   THe orbits showed abnormal changes in the left optic nerve c/w optic neuritis or possibly optic nerve ischemia.   Bloodwork showed an elevated ESR though CRP was normal.   She had a LP showing no evidence of OCB.   Temporal artery biopsy showed healed inflammatory changes worrisome for temporal arteritis.   She was treated with IV Solumedrol and then started on 60 mg prednisone daily.    She is tolerating the steroid well and vision has mildly  improved (to finger counting but not reading large print).      She denies headache or jaw claudication.    She had no episodes of numbness, weakness or clumsiness in the past.      She had a temporal artery biopsy 04/25/2021.  The pathologic results were consistent with healed temporal arteritis:  "There is focal scarring of the temporal artery, centered around the internal elastic lamina, including associated patchy loss of the elastic fibrils.  There is focal chronic inflammation around small arterioles in the adventitia around the temporal artery.  There is no evidence of active inflammation in the wall of the temporal artery."  Vascular risks:   HTN, smoking (1/4 ppd and at most was 1/2 ppd)  Data: Imaging:  MRI of the brain and orbits 04/03/2021 showed abnormal signal and mild diffusion restriction of the left optic nerve.  There were some scattered T2/FLAIR hyperintense foci in the hemispheres.  These are nonspecific and could be due to either chronic microvascular ischemic change or demyelination.  MRI of the cervical and thoracic spine 04/04/2022 showed normal spinal cord.  There were mild degenerative changes, C6-C7 being the worst level, but no spinal stenosis or nerve root compression.  Labwork Lab work 02/13/2021 showed a slightly reduced vitamin D (28.3)  Lab work 04/03/2021  showed elevated ESR (44 mm/h).  CRP was normal.  HIV was negative.  Hemoglobin A1c was negative.   The urine tox screen was positive for THC.  She had a lumbar puncture 04/04/2021.  No oligoclonal bands were noted.  However, the IgG index was elevated.  She had elevated IgG in both the serum and the CSF.  Total protein was elevated at 135.  It appears as though she had a "bloody tap" with greater than 6000 white blood cells.  There were only 3 white blood cells.  Pathology She had a temporal artery biopsy 04/25/2021.  The pathologic results were consistent with healed temporal arteritis:  "There is focal  scarring of the temporal artery, centered around the internal elastic lamina, including associated patchy loss of the elastic fibrils.  There is focal chronic inflammation around small arterioles in the adventitia around the temporal artery.  There is no evidence of active inflammation in the wall of the temporal artery."  REVIEW OF SYSTEMS: Constitutional: No fevers, chills, sweats, or change in appetite Eyes: No visual changes, double vision, eye pain Ear, nose and throat: No hearing loss, ear pain, nasal congestion, sore throat Cardiovascular: No chest pain, palpitations Respiratory:  No shortness of breath at rest or with exertion.   No wheezes GastrointestinaI: No nausea, vomiting, diarrhea, abdominal pain, fecal incontinence Genitourinary:  No dysuria, urinary retention or frequency.  No nocturia. Musculoskeletal:  No neck pain, back pain Integumentary: No rash, pruritus, skin lesions Neurological: as above Psychiatric: No depression at this time.  No anxiety Endocrine: No palpitations, diaphoresis, change in appetite, change in weigh or increased thirst Hematologic/Lymphatic:  No anemia, purpura, petechiae. Allergic/Immunologic: No itchy/runny eyes, nasal congestion, recent allergic reactions, rashes  ALLERGIES: No Known Allergies  HOME MEDICATIONS:  Current Outpatient Medications:    alendronate (FOSAMAX) 70 MG tablet, Take 70 mg by mouth once a week. Take with a full glass of water on an empty stomach., Disp: , Rfl:    amLODipine (NORVASC) 10 MG tablet, Take 1 tablet by mouth once daily, Disp: 30 tablet, Rfl: 11   aspirin EC 81 MG EC tablet, Take 1 tablet (81 mg total) by mouth daily. Swallow whole., Disp: 30 tablet, Rfl: 0   atorvastatin (LIPITOR) 40 MG tablet, Take 1 tablet (40 mg total) by mouth daily., Disp: 30 tablet, Rfl: 7   calcium citrate-vitamin D 500-500 MG-UNIT chewable tablet, 1 tab by mouth twice daily, Disp: , Rfl:    Fluocinolone Acetonide Scalp 0.01 % OIL, APPLY  TO SCALP AT BEDTIME AS PER INSTRUCTIONS, Disp: 119 mL, Rfl: 4   Multiple Vitamin (MULTIVITAMIN) tablet, Take 1 tablet by mouth daily., Disp: , Rfl:    Multiple Vitamins-Minerals (PRESERVISION AREDS 2) CAPS, Take by mouth 2 (two) times daily., Disp: , Rfl:    omeprazole (PRILOSEC) 20 MG capsule, Take 20 mg by mouth daily., Disp: , Rfl:    predniSONE (DELTASONE) 10 MG tablet, Take 60 mg by mouth daily., Disp: , Rfl:    predniSONE (DELTASONE) 5 MG tablet, Take 5 mg by mouth daily with breakfast., Disp: , Rfl:    Tocilizumab 162 MG/0.9ML SOAJ, Inject into the skin once a week., Disp: , Rfl:    triamcinolone (KENALOG) 0.025 % ointment, Apply scant amount to affected areas once daily as needed. (Patient taking differently: Apply 1 application  topically. Apply 1 application to body once daily as needed.), Disp: 80 g, Rfl: 2  PAST MEDICAL HISTORY: Past Medical History:  Diagnosis Date   Discoid lupus  Follows with Derm   Family history of breast cancer    Family history of cholangiocarcinoma    Sister   Family history of colon cancer    Patient has had genetic testing and not found to have genetic predisposition.  Recommendations for cancer screenings as usual per Tamsen Meek her note 2019 please   Family history of lung cancer    Brother   Family history of prostate cancer    Genetic testing 11/27/2017   Negative genetic testing on the Multicancer panel.  The Multi-Gene Panel offered by [facility] includes sequencing and/or deletion duplication testing of the following 84 genes: AIP, ALK, APC, ATM, AXIN2,BAP1,  BARD1, BLM, BMPR1A, BRCA1, BRCA2, BRIP1, CASR, CDC73, CDH1, CDK4, CDKN1B, CDKN1C, CDKN2A (p14ARF), CDKN2A (p16INK4a), CEBPA, CHEK2, CTNNA1, DICER1, DIS3L2, EGFR (c.2369C>T, p.Thr790Met variant   Hyperlipidemia    Hypertension     PAST SURGICAL HISTORY: Past Surgical History:  Procedure Laterality Date   COLONOSCOPY WITH PROPOFOL N/A 02/12/2018   Procedure: COLONOSCOPY WITH  PROPOFOL;  Surgeon: Ronald Lobo, MD;  Location: [facility];  Service: Endoscopy;  Laterality: N/A;    FAMILY HISTORY: Family History  Problem Relation Age of Onset   Diabetes Mother    Heart disease Mother        CHF   Hypertension Mother    Kidney disease Mother    Arthritis Mother        OA   Dementia Mother    Cancer Father 71       colon and prostate   Cancer Sister 32       ?cholangiocarcinoma   Cancer Brother 57       lung--smoker  also, prostate CA when [age] yo   Kidney disease Son        Stage IV CKD from poorly controlled Htn   Hypertension Son    Cancer Brother 55       prostate   Cancer Brother 65       Prostate--treated successfuly   Cancer Other 46       colon--sister's daughter (sister with "gallbladder duct" cancer)   Cancer Paternal Aunt 71       Breast Cancer   Breast cancer Paternal Aunt    Cancer Paternal Aunt        Bone cancer   Stroke Paternal Grandmother    Prostate cancer Cousin        mat first cousin    SOCIAL HISTORY:  Social History   Socioeconomic History   Marital status: Soil scientist    Spouse name: Serita Kyle   Number of children: 4   Years of education: 14   Highest education level: Associate degree: academic program  Occupational History   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  Vaping Use   Vaping Use: Never used  Substance and Sexual Activity   Alcohol use: Yes    Comment: Bottle of wine over 3 day weekend.     Drug use: Yes    Types: Marijuana    Comment: edibles and smoking.   Sexual activity: Not Currently  Other Topics Concern   Not on file  Social History Narrative   Has been together with female partner for 28 years  (2022).   He does not smoke.     Currently just lives with her partner, Alfonse Spruce   Social Determinants of Health   Financial Resource Strain: Low Risk  (02/15/2021)   Overall Financial Resource Strain (CARDIA)    Difficulty of Paying Living Expenses: Not hard at all  Food Insecurity: No Food Insecurity (03/29/2021)   Hunger Vital Sign    Worried About Running Out of Food in the Last Year: Never true    Ran Out of Food in the Last Year: Never true  Transportation Needs: No Transportation Needs (03/29/2021)   PRAPARE - Hydrologist (Medical): No    Lack of Transportation (Non-Medical): No  Physical Activity: Inactive (02/11/2018)   Exercise Vital Sign    Days of Exercise per Week: 0 days    Minutes of Exercise per Session: 0 min  Stress: No Stress Concern Present (08/20/2017)   [place]    Feeling of Stress : Only a little  Social Connections: Socially Integrated (02/11/2018)   Social Connection and Isolation Panel [NHANES]    Frequency of Communication with Friends and Family: More than three times a week    Frequency of Social Gatherings with Friends and Family: More than three times a week    Attends Religious Services: More than 4 times per year    Active Member of [employer] or Organizations: Yes    Attends Archivist Meetings: More than 4 times per year    Marital Status: Living with partner  Intimate Partner Violence: Not At Risk (02/15/2021)   Humiliation, Afraid, Rape, and Kick questionnaire    Fear of Current or Ex-Partner: No    Emotionally Abused: No    Physically Abused: No    Sexually Abused: No     PHYSICAL EXAM  Vitals:   11/15/21 0822  BP: 137/82  Pulse: 83  Weight: 162 lb (73.5 kg)  Height: '5\' 3"'  (1.6 m)    Body mass index is 28.7 kg/m.   General: The patient is well-developed and well-nourished and in no acute distress  HEENT:  Head is [place]/AT.  Sclera  are anicteric.  Funduscopic exam shows normal right optic disc and slight left optic nerve pallor.      Skin: Extremities are without rash or  edema.  Musculoskeletal:  Back is nontender  Neurologic Exam  Mental status: The patient is alert and oriented x 3 at the time of the examination. The patient has apparent normal recent and remote memory, with an apparently normal attention span and concentration ability.   Speech is normal.  Cranial nerves: Extraocular movements are full. Pupils show 2+ left APD.  Visual fields are full OD.  FInger counting only  OS and good VA OD.  Facial symmetry is present. There is good facial sensation to soft touch bilaterally.Facial strength is normal.  Trapezius and sternocleidomastoid strength is normal. No dysarthria is noted.  The tongue is midline, and the patient has symmetric elevation of the soft palate. No obvious hearing deficits are noted.  Motor:  Muscle bulk is normal.   Tone is normal. Strength is  5 / 5 in all 4 extremities.   Sensory: Sensory testing is intact to pinprick, soft touch and vibration sensation in all 4 extremities.  Coordination: Cerebellar testing reveals good finger-nose-finger and heel-to-shin bilaterally.  Gait and station: Station is  normal.   Gait is normal. Tandem gait is normal. Romberg is negative.   Reflexes: Deep tendon reflexes are symmetric and normal bilaterally.      DIAGNOSTIC DATA (LABS, IMAGING, TESTING) - I reviewed patient records, labs, notes, testing and imaging myself where available.  Lab Results  Component Value Date   WBC 9.3 04/07/2021   HGB 12.6 04/07/2021   HCT 36.9 04/07/2021   MCV 93.9 04/07/2021   PLT 250 04/07/2021      Component Value Date/Time   NA 139 04/08/2021 0225   NA 142 02/13/2021 0915   K 4.0 04/08/2021 0225   CL 103 04/08/2021 0225   CO2 27 04/08/2021 0225   GLUCOSE 142 (H) 04/08/2021 0225   BUN 9 04/08/2021 0225   BUN 7 (L) 02/13/2021 0915   CREATININE 0.60 04/08/2021  0225   CALCIUM 9.5 04/08/2021 0225   PROT 7.4 10/16/2021 0858   ALBUMIN 4.7 10/16/2021 0858   AST 34 10/16/2021 0858   ALT 39 (H) 10/16/2021 0858   ALKPHOS 57 10/16/2021 0858   BILITOT 0.3 10/16/2021 0858   GFRNONAA >60 04/08/2021 0225   GFRAA 117 02/21/2020 0900   Lab Results  Component Value Date   CHOL 198 10/16/2021   HDL 74 10/16/2021   LDLCALC 106 (H) 10/16/2021   LDLDIRECT 158.6 (H) 04/03/2021   TRIG 101 10/16/2021   CHOLHDL 3.6 04/03/2021   Lab Results  Component Value Date   HGBA1C 5.2 04/03/2021        ASSESSMENT AND PLAN  Vision loss of left eye  Temporal arteritis (HCC)  Current chronic use of systemic steroids    No new neurologic events.  Vision is stable and changes are likely permanent.  Although there was some concern about MS.  This is unlikely. If ESR/CRP remain low should be able to taper off prednisone.   She will be seeing Dr. Tamera Punt in 2-3 weeks.     3.   Rtc if new or worsening neurologic symptoms.       Alyda Megna A. Felecia Shelling, MD, [facility] 5/0/5397, 6:73 PM Certified in Neurology, Clinical Neurophysiology, Sleep Medicine and Neuroimaging  [facility] [address], [place], [place] [address] [phone]

## 2021-11-27 ENCOUNTER — Other Ambulatory Visit (INDEPENDENT_AMBULATORY_CARE_PROVIDER_SITE_OTHER): Payer: 59

## 2021-11-27 DIAGNOSIS — R7989 Other specified abnormal findings of blood chemistry: Secondary | ICD-10-CM

## 2021-11-28 LAB — VITAMIN D 25 HYDROXY (VIT D DEFICIENCY, FRACTURES): Vit D, 25-Hydroxy: 33 ng/mL (ref 30.0–100.0)

## 2021-12-09 ENCOUNTER — Encounter: Payer: Self-pay | Admitting: Internal Medicine

## 2022-02-21 ENCOUNTER — Ambulatory Visit (INDEPENDENT_AMBULATORY_CARE_PROVIDER_SITE_OTHER): Payer: Medicaid Other | Admitting: Internal Medicine

## 2022-02-21 ENCOUNTER — Encounter: Payer: Self-pay | Admitting: Internal Medicine

## 2022-02-21 VITALS — BP 128/80 | HR 83 | Resp 12 | Ht 63.0 in | Wt 160.0 lb

## 2022-02-21 DIAGNOSIS — Z780 Asymptomatic menopausal state: Secondary | ICD-10-CM

## 2022-02-21 DIAGNOSIS — Z7952 Long term (current) use of systemic steroids: Secondary | ICD-10-CM | POA: Diagnosis not present

## 2022-02-21 DIAGNOSIS — Z0000 Encounter for general adult medical examination without abnormal findings: Secondary | ICD-10-CM

## 2022-02-21 DIAGNOSIS — R7989 Other specified abnormal findings of blood chemistry: Secondary | ICD-10-CM

## 2022-02-21 DIAGNOSIS — Z1231 Encounter for screening mammogram for malignant neoplasm of breast: Secondary | ICD-10-CM

## 2022-02-21 DIAGNOSIS — Z1159 Encounter for screening for other viral diseases: Secondary | ICD-10-CM

## 2022-02-21 DIAGNOSIS — M316 Other giant cell arteritis: Secondary | ICD-10-CM

## 2022-02-21 DIAGNOSIS — M791 Myalgia, unspecified site: Secondary | ICD-10-CM

## 2022-02-21 NOTE — Progress Notes (Signed)
Subjective:    Patient ID: Jacqueline Fox, female   DOB: 1956/01/11, 66 y.o.   MRN: [id number]   HPI  CPE without pap  1.  Pap:  Normal 02/15/2021.    2.  Mammogram:  Normal 03/29/21  3.  Osteoprevention:  Taking Alendronate with long time corticosteroid use, postmenopausal, smoker.  Taking Calcium and Vitamin D supplementation as well.  Yard work twice weekly.    4.  Guaiac Cards/FIT:  Negative 02/2021  5.  Colonoscopy:  Colonoscopy with [facility]/Dr. Cristina Gong in 2019 and no polyps.  + diverticulosis.  Repeat in 2029.   6.  Immunizations:  Has not had influenza vaccine.    7.  Glucose/Cholesterol:  A1C well within normal range past year.  Cholesterol improved on Atorvastatin since February of this year and cholesterol panel improved. Lipid Panel     Component Value Date/Time   CHOL 198 10/16/2021 0858   TRIG 101 10/16/2021 0858   HDL 74 10/16/2021 0858   CHOLHDL 3.6 04/03/2021 1930   VLDL 27 04/03/2021 1930   LDLCALC 106 (H) 10/16/2021 0858   LDLDIRECT 158.6 (H) 04/03/2021 1930   LABVLDL 18 10/16/2021 0858      Current Meds  Medication Sig   alendronate (FOSAMAX) 70 MG tablet Take 70 mg by mouth once a week. Take with a full glass of water on an empty stomach.   amLODipine (NORVASC) 10 MG tablet Take 1 tablet by mouth once daily   aspirin EC 81 MG EC tablet Take 1 tablet (81 mg total) by mouth daily. Swallow whole.   atorvastatin (LIPITOR) 40 MG tablet Take 1 tablet (40 mg total) by mouth daily.   calcium citrate-vitamin D 500-500 MG-UNIT chewable tablet 1 tab by mouth twice daily   Fluocinolone Acetonide Scalp 0.01 % OIL APPLY TO SCALP AT BEDTIME AS PER INSTRUCTIONS   Multiple Vitamin (MULTIVITAMIN) tablet Take 1 tablet by mouth daily.   Multiple Vitamins-Minerals (PRESERVISION AREDS 2) CAPS Take by mouth 2 (two) times daily.   omeprazole (PRILOSEC) 20 MG capsule Take 20 mg by mouth daily.   predniSONE (DELTASONE) 5 MG tablet Take 5 mg by mouth daily with breakfast.    Tocilizumab 162 MG/0.9ML SOAJ Inject into the skin once a week.   triamcinolone (KENALOG) 0.025 % ointment Apply scant amount to affected areas once daily as needed. (Patient taking differently: Apply 1 application  topically. Apply 1 application to body once daily as needed.)   No Known Allergies  Past Medical History:  Diagnosis Date   Discoid lupus    Follows with Derm   Family history of breast cancer    Family history of cholangiocarcinoma    Sister   Family history of colon cancer    Patient has had genetic testing and not found to have genetic predisposition.  Recommendations for cancer screenings as usual per Tamsen Meek her note 2019 please   Family history of lung cancer    Brother   Family history of prostate cancer    Genetic testing 11/27/2017   Negative genetic testing on the Multicancer panel.  The Multi-Gene Panel offered by [facility] includes sequencing and/or deletion duplication testing of the following 84 genes: AIP, ALK, APC, ATM, AXIN2,BAP1,  BARD1, BLM, BMPR1A, BRCA1, BRCA2, BRIP1, CASR, CDC73, CDH1, CDK4, CDKN1B, CDKN1C, CDKN2A (p14ARF), CDKN2A (p16INK4a), CEBPA, CHEK2, CTNNA1, DICER1, DIS3L2, EGFR (c.2369C>T, p.Thr790Met variant   Hyperlipidemia    Primary hypertension    Past Surgical History:  Procedure Laterality Date   COLONOSCOPY  WITH PROPOFOL N/A 02/12/2018   Procedure: COLONOSCOPY WITH PROPOFOL;  Surgeon: Ronald Lobo, MD;  Location: [facility];  Service: Endoscopy;  Laterality: N/A;   TEMPORAL ARTERY BIOPSY / LIGATION Left 05/2021   Family History  Problem Relation Age of Onset   Diabetes Mother    Heart disease Mother        CHF   Hypertension Mother    Kidney disease Mother    Arthritis Mother        OA   Dementia Mother    Cancer Father 52       colon and prostate   Cancer Sister 51       ?cholangiocarcinoma   Cancer Brother 72       lung--smoker  also, prostate CA when 55-54 yo   Cancer Brother 60       prostate   Cancer  Brother 41       Prostate--treated successfuly   Diabetes Son    Kidney disease Son        Stage IV CKD from poorly controlled Htn   Hypertension Son    Kidney disease Paternal Aunt        peritoneal dialysis--not clear what diagnosis   Cancer Paternal Aunt 20       Breast Cancer   Breast cancer Paternal Aunt    Cancer Paternal Aunt        Bone cancer   Heart disease Paternal Aunt    Stroke Paternal Grandmother    Prostate cancer Cousin        mat first cousin   Cancer Other 59       colon--sister's daughter (sister with "gallbladder duct" cancer)   Social History   Socioeconomic History   Marital status: Soil scientist    Spouse name: Serita Kyle   Number of children: 4   Years of education: 14   Highest education level: Associate degree: academic program  Occupational History   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  No interest in support.  Vaping Use   Vaping Use: Never used  Substance and Sexual Activity   Alcohol use: Yes    Comment: Was drinking 2 glasses wine nightly until Sept 2023and then lost interest   Drug use: Yes    Types: Marijuana    Comment: limited since September 2023--just not interested in using   Sexual activity: Not Currently  Other Topics Concern   Not on file  Social History Narrative   Has been together with female partner for 29 years (2023).   He does not smoke.     Currently just lives with her partner, Alfonse Spruce   Social Determinants of Health   Financial Resource Strain: Low Risk  (02/21/2022)   Overall Financial Resource Strain (CARDIA)    Difficulty of Paying Living Expenses: Not hard at all  Food Insecurity: No Food Insecurity (02/21/2022)   Hunger  Vital Sign    Worried About Running Out of Food in the Last Year: Never true    Ran Out of Food in the Last Year: Never true  Transportation Needs: No Transportation Needs (02/21/2022)   PRAPARE -  Hydrologist (Medical): No    Lack of Transportation (Non-Medical): No  Physical Activity: Inactive (02/11/2018)   Exercise Vital Sign    Days of Exercise per Week: 0 days    Minutes of Exercise per Session: 0 min  Stress: No Stress Concern Present (08/20/2017)   [place]    Feeling of Stress : Only a little  Social Connections: Socially Integrated (02/11/2018)   Social Connection and Isolation Panel [NHANES]    Frequency of Communication with Friends and Family: More than three times a week    Frequency of Social Gatherings with Friends and Family: More than three times a week    Attends Religious Services: More than 4 times per year    Active Member of [employer] or Organizations: Yes    Attends Archivist Meetings: More than 4 times per year    Marital Status: Living with partner  Intimate Partner Violence: Not At Risk (02/21/2022)   Humiliation, Afraid, Rape, and Kick questionnaire    Fear of Current or Ex-Partner: No    Emotionally Abused: No    Physically Abused: No    Sexually Abused: No      Review of Systems  HENT:  Negative for dental problem.   Eyes:  Positive for visual disturbance (Black and white, more so shadows out of left eye.  Vision has not improved since sudden loss of vision last year.  She has not followed up with her eye doctor on [place].).  Respiratory:  Negative for cough and shortness of breath (Occasional need for sigh breath. Generally at rest.).   Cardiovascular:  Negative for chest pain, palpitations and leg swelling.  Endocrine: Positive for heat intolerance.  Musculoskeletal:        Started with pain in anterior thighs, seems to radiate from anterior  groin with aching into knees and posterior calf areas bilaterally. Hard to walk. Very uncomfortable to sleep. No upper body symptoms. No back pain.    Skin:        Stable skin involvement from lupus  Neurological:  Negative for headaches (Never with giant cell arteritis symptoms.).       Never has had and no current jaw claudication symptoms as well. Does have muscle twitching on left side of face rarely now, but was more pronounced when lost vision in left eye last year with onset of giant cell arteritis signs/symptoms.      Objective:   BP 128/80 (BP Location: Right Arm, Patient Position: Sitting, Cuff Size: Normal)   Pulse 83   Resp 12   Ht 5' 3" (1.6 m)   Wt 160 lb (72.6 kg)   LMP 02/27/2011   BMI 28.34 kg/m   Physical Exam HENT:     Head: Normocephalic and atraumatic.     Right Ear: Tympanic membrane, ear canal and external ear normal.     Left Ear: Tympanic membrane, ear canal and external ear normal.     Nose: Nose normal.     Mouth/Throat:     Mouth: Mucous membranes are moist.     Pharynx: Oropharynx is clear.  Eyes:     Extraocular Movements: Extraocular movements intact.     Conjunctiva/sclera: Conjunctivae normal.     Pupils: Pupils are equal, round, and reactive to light.     Comments: Left disc brighter white.  Neck:     Thyroid: No thyroid mass or thyromegaly.  Cardiovascular:  Rate and Rhythm: Normal rate and regular rhythm.     Heart sounds: S1 normal and S2 normal. Murmur (Grade II/VI SEM radiating to right 2nd interspace.) heard.     No friction rub. No S3 or S4 sounds.     Comments: No carotid bruits.  Unable to hear radiation of heart murmur.  Carotid, radial, femoral, DP and PT pulses normal and equal.    Pulmonary:     Effort: Pulmonary effort is normal.     Breath sounds: Normal breath sounds and air entry.  Abdominal:     General: Bowel sounds are normal.     Palpations: Abdomen is soft. There is no hepatomegaly, splenomegaly or mass.      Tenderness: There is no abdominal tenderness.     Hernia: No hernia is present.  Genitourinary:    Comments: Normal external female genitalia No uterine or adnexal mass or tenderness. Musculoskeletal:        General: Normal range of motion.     Cervical back: Normal range of motion and neck supple.     Right lower leg: No edema.     Left lower leg: No edema.     Comments: Knees somewhat hypertrophic bilaterally.  No palpable effusion.  Tender over quads and tensor fascia lata on left as well as left greater trochanter  Generalized tenderness of muscles of legs  Lymphadenopathy:     Head:     Right side of head: No submental or submandibular adenopathy.     Left side of head: No submental or submandibular adenopathy.     Cervical: No cervical adenopathy.     Upper Body:     Right upper body: No supraclavicular or axillary adenopathy.     Left upper body: No supraclavicular or axillary adenopathy.     Lower Body: No right inguinal adenopathy. No left inguinal adenopathy.  Skin:    General: Skin is warm.     Findings: No rash.     Comments: Scattered areas of hypopgimentation on ears, hairline  Neurological:     General: No focal deficit present.     Mental Status: She is alert and oriented to person, place, and time.     Cranial Nerves: Cranial nerves 2-12 are intact.     Sensory: Sensation is intact.     Motor: Motor function is intact.     Coordination: Coordination is intact.     Gait: Gait is intact.     Deep Tendon Reflexes: Reflexes are normal and symmetric.  Psychiatric:        Speech: Speech normal.        Behavior: Behavior normal. Behavior is cooperative.      Assessment & Plan    CPE without pap Mammogram Return FIT in 2 weeks. Hep C screening. Vaccinations on immune suppressants (Tocilizumab)and chronic prednisone:  Rheumatologist reportedly asked her to hold her injection for influenza vaccine, so next Wed will hold Tocilizumab, come in on the following  Monday for her influenza and will get COVID same day at pharmacy of choice.   Pneumococcal 20 and Shingrix perhaps 2 months later in similar fashion.    2.  Heart murmur:  Echo last year normal.  3.  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 Katy Fitch.  Has not regained vision with treatment.  Follow with Rheumatology.    4.  Long term corticosteroid use/low vitamin D/post menopausal:  DEXA bone density.  5.  Muscle pain of legs:  Hold Atorvastatin.  CK.  TSH, ESR

## 2022-02-21 NOTE — Patient Instructions (Signed)
Hold Atorvastatin and I will call regarding CPK--call in a week if you do not hear from me.

## 2022-02-22 LAB — COMPREHENSIVE METABOLIC PANEL
ALT: 34 IU/L — ABNORMAL HIGH (ref 0–32)
AST: 29 IU/L (ref 0–40)
Albumin/Globulin Ratio: 1.9 (ref 1.2–2.2)
Albumin: 5 g/dL — ABNORMAL HIGH (ref 3.9–4.9)
Alkaline Phosphatase: 56 IU/L (ref 44–121)
BUN/Creatinine Ratio: 12 (ref 12–28)
BUN: 7 mg/dL — ABNORMAL LOW (ref 8–27)
Bilirubin Total: 0.2 mg/dL (ref 0.0–1.2)
CO2: 19 mmol/L — ABNORMAL LOW (ref 20–29)
Calcium: 10.7 mg/dL — ABNORMAL HIGH (ref 8.7–10.3)
Chloride: 102 mmol/L (ref 96–106)
Creatinine, Ser: 0.59 mg/dL (ref 0.57–1.00)
Globulin, Total: 2.7 g/dL (ref 1.5–4.5)
Glucose: 109 mg/dL — ABNORMAL HIGH (ref 70–99)
Potassium: 3.4 mmol/L — ABNORMAL LOW (ref 3.5–5.2)
Sodium: 143 mmol/L (ref 134–144)
Total Protein: 7.7 g/dL (ref 6.0–8.5)
eGFR: 101 mL/min/{1.73_m2} (ref >59)

## 2022-02-22 LAB — HEPATITIS C ANTIBODY: Hep C Virus Ab: NONREACTIVE

## 2022-02-22 LAB — TSH: TSH: 0.643 u[IU]/mL (ref 0.450–4.500)

## 2022-02-22 LAB — CK: Total CK: 53 U/L (ref 32–182)

## 2022-02-22 LAB — SEDIMENTATION RATE: Sed Rate: 15 mm/hr (ref 0–40)

## 2022-03-01 ENCOUNTER — Other Ambulatory Visit: Payer: Self-pay | Admitting: Internal Medicine

## 2022-03-05 ENCOUNTER — Ambulatory Visit (INDEPENDENT_AMBULATORY_CARE_PROVIDER_SITE_OTHER): Payer: Medicaid Other | Admitting: Internal Medicine

## 2022-03-05 DIAGNOSIS — Z23 Encounter for immunization: Secondary | ICD-10-CM | POA: Diagnosis not present

## 2022-03-07 ENCOUNTER — Other Ambulatory Visit (INDEPENDENT_AMBULATORY_CARE_PROVIDER_SITE_OTHER): Payer: Medicaid Other

## 2022-03-07 DIAGNOSIS — Z1211 Encounter for screening for malignant neoplasm of colon: Secondary | ICD-10-CM

## 2022-03-07 LAB — POC FIT TEST STOOL: Fecal Occult Blood: NEGATIVE

## 2022-04-10 ENCOUNTER — Encounter (HOSPITAL_COMMUNITY): Payer: Self-pay | Admitting: Emergency Medicine

## 2022-04-10 ENCOUNTER — Ambulatory Visit (HOSPITAL_COMMUNITY)
Admission: EM | Admit: 2022-04-10 | Discharge: 2022-04-10 | Disposition: A | Discharge status: Home / Self Care | Payer: Commercial Managed Care - HMO | Attending: Emergency Medicine | Admitting: Emergency Medicine

## 2022-04-10 DIAGNOSIS — H66002 Acute suppurative otitis media without spontaneous rupture of ear drum, left ear: Secondary | ICD-10-CM

## 2022-04-10 MED ORDER — CEFDINIR 300 MG PO CAPS: 300.0000 mg | ORAL_CAPSULE | Freq: Two times a day (BID) | ORAL | 0 refills | Status: DC

## 2022-04-10 MED ORDER — CEFDINIR 300 MG PO CAPS: 300.0000 mg | ORAL_CAPSULE | Freq: Two times a day (BID) | ORAL | 0 refills | Status: AC

## 2022-04-10 MED ORDER — IBUPROFEN 800 MG PO TABS
ORAL_TABLET | ORAL | Status: AC
  Filled 2022-04-10: qty 1

## 2022-04-10 MED ORDER — IBUPROFEN 800 MG PO TABS
800.0000 mg | ORAL_TABLET | Freq: Once | ORAL | Status: AC
  Administered 2022-04-10: 800 mg via ORAL

## 2022-04-10 NOTE — ED Provider Notes (Signed)
[place]    CSN: [id number] Arrival date & time: 04/10/22  4098    HISTORY   Chief Complaint  Patient presents with   Tinnitus   HPI Jacqueline Fox is a pleasant, 66 y.o. female who presents to urgent care today. Patient complains of a loud ringing in her left ear that began today.  Patient states is very uncomfortable.  On arrival today, patient has a temperature of 99.1 with a heart rate of 110, vital signs are otherwise normal.  Patient denies history of frequent ear infections but states that for the past week and a half she has had nasal congestion and sinus pressure.  Patient denies fever, body aches, chills, dizziness, hearing loss.  The history is provided by the patient.   Past Medical History:  Diagnosis Date   Discoid lupus    Follows with Derm   Family history of breast cancer    Family history of cholangiocarcinoma    Sister   Family history of colon cancer    Patient has had genetic testing and not found to have genetic predisposition.  Recommendations for cancer screenings as usual per Tamsen Meek her note 2019 please   Family history of lung cancer    Brother   Family history of prostate cancer    Genetic testing 11/27/2017   Negative genetic testing on the Multicancer panel.  The Multi-Gene Panel offered by [facility] includes sequencing and/or deletion duplication testing of the following 84 genes: AIP, ALK, APC, ATM, AXIN2,BAP1,  BARD1, BLM, BMPR1A, BRCA1, BRCA2, BRIP1, CASR, CDC73, CDH1, CDK4, CDKN1B, CDKN1C, CDKN2A (p14ARF), CDKN2A (p16INK4a), CEBPA, CHEK2, CTNNA1, DICER1, DIS3L2, EGFR (c.2369C>T, p.Thr790Met variant   Hyperlipidemia    Primary hypertension    Patient Active Problem List   Diagnosis Date Noted   Low vitamin D level 10/09/2021   Giant cell arteritis ([place]) 05/15/2021   Current chronic use of systemic steroids 05/15/2021   Vision loss of left eye    Optic neuritis 04/03/2021   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    Post-menopausal 08/11/2020   Mixed hyperlipidemia 08/11/2020   Family history of cholangiocarcinoma    Family history of lung cancer    Screening breast examination 04/29/2019   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   Genetic testing 11/27/2017   Family history of colon cancer    Family history of breast cancer    Family history of prostate cancer    Keloid 10/22/2017   Family history of colon cancer in father 10/22/2017   Seborrhea capitis in adult 10/22/2017   Dental decay 10/22/2017   Family history of cancer 09/19/2017   Primary hypertension 09/19/2017   Hypopigmentation 09/19/2017   Past Surgical History:  Procedure Laterality Date   COLONOSCOPY WITH PROPOFOL N/A 02/12/2018   Procedure: COLONOSCOPY WITH PROPOFOL;  Surgeon: Ronald Lobo, MD;  Location: [facility];  Service: Endoscopy;  Laterality: N/A;   TEMPORAL ARTERY BIOPSY / LIGATION Left 05/2021   OB History     Gravida  5   Para      Term      Preterm      AB      Living         SAB      IAB      Ectopic      Multiple      Live Births  4          Home Medications    Prior to Admission medications   Medication Sig Start Date End  Date Taking? Authorizing Provider  alendronate (FOSAMAX) 70 MG tablet Take 70 mg by mouth once a week. Take with a full glass of water on an empty stomach.    [provider]  amLODipine (NORVASC) 10 MG tablet Take 1 tablet by mouth once daily 10/31/21   Mack Hook, MD  aspirin EC 81 MG EC tablet Take 1 tablet (81 mg total) by mouth daily. Swallow whole. 04/09/21   Domenic Polite, MD  calcium citrate-vitamin D 500-500 MG-UNIT chewable tablet 1 tab by mouth twice daily 08/11/20   Mack Hook, MD  Fluocinolone Acetonide Scalp 0.01 % OIL APPLY TO SCALP AT BEDTIME AS PER INSTRUCTIONS 09/17/21   Mack Hook, MD  Multiple Vitamin (MULTIVITAMIN) tablet Take 1 tablet by mouth daily.    [provider]  Multiple Vitamins-Minerals (PRESERVISION  AREDS 2) CAPS Take by mouth 2 (two) times daily.    [provider]  omeprazole (PRILOSEC) 20 MG capsule Take 20 mg by mouth daily.    [provider]  predniSONE (DELTASONE) 5 MG tablet Take 5 mg by mouth daily with breakfast.    [provider]  Tocilizumab 162 MG/0.9ML SOAJ Inject into the skin once a week.    [provider]  triamcinolone (KENALOG) 0.025 % ointment Apply scant amount to affected areas once daily as needed. Patient taking differently: Apply 1 application  topically. Apply 1 application to body once daily as needed. 02/10/19   Mack Hook, MD    Family History Family History  Problem Relation Age of Onset   Diabetes Mother    Heart disease Mother        CHF   Hypertension Mother    Kidney disease Mother    Arthritis Mother        OA   Dementia Mother    Cancer Father 5       colon and prostate   Cancer Sister 27       ?cholangiocarcinoma   Cancer Brother 43       lung--smoker  also, prostate CA when 21-54 yo   Cancer Brother 35       prostate   Cancer Brother 38       Prostate--treated successfuly   Diabetes Son    Kidney disease Son        Stage IV CKD from poorly controlled Htn   Hypertension Son    Kidney disease Paternal Aunt        peritoneal dialysis--not clear what diagnosis   Cancer Paternal Aunt 22       Breast Cancer   Breast cancer Paternal Aunt    Cancer Paternal Aunt        Bone cancer   Heart disease Paternal Aunt    Stroke Paternal Grandmother    Prostate cancer Cousin        mat first cousin   Cancer Other 53       colon--sister's daughter (sister with "gallbladder duct" cancer)   Social History Social History   Tobacco Use   Smoking status: Every Day    Packs/day: 0.25    Years: 43.00    Total pack years: 10.75    Types: Cigarettes   Smokeless tobacco: Never   Tobacco comments:    Not clear if she is ready to quit, despite family history of cancers.  No interest in support.   Vaping Use   Vaping Use: Never used  Substance Use Topics   Alcohol use: Yes    Comment:  Was drinking 2 glasses wine nightly until Sept 2023and then lost interest   Drug use: Yes    Types: Marijuana    Comment: limited since September 2023--just not interested in using   Allergies   Patient has no known allergies.  Review of Systems Review of Systems Pertinent findings revealed after performing a 14 point review of systems has been noted in the history of present illness.  Physical Exam Triage Vital Signs ED Triage Vitals  Enc Vitals Group     BP 03/09/21 0827 (!) 147/82     Pulse Rate 03/09/21 0827 72     Resp 03/09/21 0827 18     Temp 03/09/21 0827 98.3 F (36.8 C)     Temp Source 03/09/21 0827 Oral     SpO2 03/09/21 0827 98 %     Weight --      Height --      Head Circumference --      Peak Flow --      Pain Score 03/09/21 0826 5     Pain Loc --      Pain Edu? --      Excl. in [place]? --   No data found.  Updated Vital Signs BP 134/77 (BP Location: Right Arm)   Pulse (!) 110   Temp 99.1 F (37.3 C) (Oral)   Resp 18   LMP 02/27/2011   SpO2 100%   Physical Exam Vitals and nursing note reviewed.  Constitutional:      General: She is not in acute distress.    Appearance: Normal appearance. She is not ill-appearing.  HENT:     Head: Normocephalic and atraumatic.     Salivary Glands: Right salivary gland is not diffusely enlarged or tender. Left salivary gland is not diffusely enlarged or tender.     Right Ear: Hearing, tympanic membrane, ear canal and external ear normal. No drainage. No middle ear effusion. There is no impacted cerumen. Tympanic membrane is not erythematous or bulging.     Left Ear: Hearing, ear canal and external ear normal. No drainage. A middle ear effusion is present. There is no impacted cerumen. Tympanic membrane is injected, erythematous and retracted. Tympanic membrane is not bulging.     Nose: Nose normal. No nasal deformity, septal  deviation, mucosal edema, congestion or rhinorrhea.     Right Turbinates: Not enlarged, swollen or pale.     Left Turbinates: Not enlarged, swollen or pale.     Right Sinus: No maxillary sinus tenderness or frontal sinus tenderness.     Left Sinus: No maxillary sinus tenderness or frontal sinus tenderness.     Mouth/Throat:     Lips: Pink. No lesions.     Mouth: Mucous membranes are moist. No oral lesions.     Pharynx: Oropharynx is clear. Uvula midline. No posterior oropharyngeal erythema or uvula swelling.     Tonsils: No tonsillar exudate. 0 on the right. 0 on the left.  Eyes:     General: Lids are normal.        Right eye: No discharge.        Left eye: No discharge.     Extraocular Movements: Extraocular movements intact.     Conjunctiva/sclera: Conjunctivae normal.     Right eye: Right conjunctiva is not injected.     Left eye: Left conjunctiva is not injected.  Neck:     Trachea: Trachea and phonation normal.  Cardiovascular:     Rate and Rhythm: Normal rate and regular  rhythm.     Pulses: Normal pulses.     Heart sounds: Normal heart sounds. No murmur heard.    No friction rub. No gallop.  Pulmonary:     Effort: Pulmonary effort is normal. No accessory muscle usage, prolonged expiration or respiratory distress.     Breath sounds: Normal breath sounds. No stridor, decreased air movement or transmitted upper airway sounds. No decreased breath sounds, wheezing, rhonchi or rales.  Chest:     Chest wall: No tenderness.  Musculoskeletal:        General: Normal range of motion.     Cervical back: Normal range of motion and neck supple. Normal range of motion.  Lymphadenopathy:     Cervical: No cervical adenopathy.  Skin:    General: Skin is warm and dry.     Findings: No erythema or rash.  Neurological:     General: No focal deficit present.     Mental Status: She is alert and oriented to person, place, and time.  Psychiatric:        Mood and Affect: Mood normal.         Behavior: Behavior normal.     Visual Acuity Right Eye Distance:   Left Eye Distance:   Bilateral Distance:    Right Eye Near:   Left Eye Near:    Bilateral Near:     UC Couse / Diagnostics / Procedures:     Radiology No results found.  Procedures Procedures (including critical care time) EKG  Pending results:  Labs Reviewed - No data to display  Medications Ordered in UC: Medications  ibuprofen (ADVIL) tablet 800 mg (has no administration in time range)    UC Diagnoses / Final Clinical Impressions(s)   I have reviewed the triage vital signs and the nursing notes.  Pertinent labs & imaging results that were available during my care of the patient were reviewed by me and considered in my medical decision making (see chart for details).    Final diagnoses:  Acute suppurative otitis media of left ear without spontaneous rupture of tympanic membrane, recurrence not specified   Patient provided with a prescription for Omnicef for 10 days.  Advised ibuprofen every 6-8 hours for pain relief.  Return precautions advised.  ED Prescriptions     Medication Sig Dispense Auth. Provider   cefdinir (OMNICEF) 300 MG capsule  (Status: Discontinued) Take 1 capsule (300 mg total) by mouth 2 (two) times daily for 10 days. 20 capsule Lynden Oxford Scales, PA-C   cefdinir (OMNICEF) 300 MG capsule Take 1 capsule (300 mg total) by mouth 2 (two) times daily for 10 days. 20 capsule Lynden Oxford Scales, PA-C      PDMP not reviewed this encounter.  Disposition Upon Discharge:  Condition: stable for discharge home Home: take medications as prescribed; routine discharge instructions as discussed; follow up as advised.  Patient presented with an acute illness with associated systemic symptoms and significant discomfort requiring urgent management. In my opinion, this is a condition that a prudent lay person (someone who possesses an average knowledge of health and medicine) may  potentially expect to result in complications if not addressed urgently such as respiratory distress, impairment of bodily function or dysfunction of bodily organs.   Routine symptom specific, illness specific and/or disease specific instructions were discussed with the patient and/or caregiver at length.   As such, the patient has been evaluated and assessed, work-up was performed and treatment was provided in alignment with urgent care protocols and  evidence based medicine.  Patient/parent/caregiver has been advised that the patient may require follow up for further testing and treatment if the symptoms continue in spite of treatment, as clinically indicated and appropriate.  If the patient was tested for COVID-19, Influenza and/or RSV, then the patient/parent/guardian was advised to isolate at home pending the results of his/her diagnostic coronavirus test and potentially longer if they're positive. I have also advised pt that if his/her COVID-19 test returns positive, it's recommended to self-isolate for at least 10 days after symptoms first appeared AND until fever-free for 24 hours without fever reducer AND other symptoms have improved or resolved. Discussed self-isolation recommendations as well as instructions for household member/close contacts as per the [facility] and [place] DHHS, and also gave patient the [facility] packet with this information.  Patient/parent/caregiver has been advised to return to the [facility] or PCP in 3-5 days if no better; to PCP or the Emergency Department if new signs and symptoms develop, or if the current signs or symptoms continue to change or worsen for further workup, evaluation and treatment as clinically indicated and appropriate  The patient will follow up with their current PCP if and as advised. If the patient does not currently have a PCP we will assist them in obtaining one.   The patient may need specialty follow up if the symptoms continue, in spite of conservative treatment  and management, for further workup, evaluation, consultation and treatment as clinically indicated and appropriate.  Patient/parent/caregiver verbalized understanding and agreement of plan as discussed.  All questions were addressed during visit.  Please see discharge instructions below for further details of plan.  Discharge Instructions:   Discharge Instructions      Please read below to learn more about the medications, dosages and frequencies that I recommend to help alleviate your symptoms and to get you feeling better soon:   Omnicef (cefdinir):  1 capsule twice daily for 10 days, you can take it with or without food.  This antibiotic can cause upset stomach, this will resolve once antibiotics are complete.  You are welcome to use a probiotic, eat yogurt, take Imodium while taking this medication.  Please avoid other systemic medications such as Maalox, Pepto-Bismol or milk of magnesia as they can interfere with your body's ability to absorb the antibiotics.   Advil, Motrin (ibuprofen): This is a good anti-inflammatory medication which addresses aches, pains and inflammation of the upper airways that causes sinus and nasal congestion as well as in the lower airways which makes your cough feel tight and sometimes burn.  This will also help reduce the ringing sensation in your left ear.  I recommend that you take between 400 to 600 mg every 6-8 hours as needed.      Please follow-up within the next 5-7 days either with your primary care provider or urgent care if your symptoms do not meaningfully improve.          Thank you for visiting urgent care today.  We appreciate the opportunity to participate in your care.         This office note has been dictated using Museum/gallery curator.  Unfortunately, this method of dictation can sometimes lead to typographical or grammatical errors.  I apologize for your inconvenience in advance if this occurs.  Please do not hesitate to  reach out to me if clarification is needed.      Lynden Oxford Scales, [place] 04/12/22 [phone]

## 2022-04-10 NOTE — ED Triage Notes (Signed)
Pt c/o ringing in left ear that started today. Report very uncomfortable.

## 2022-04-10 NOTE — Discharge Instructions (Signed)
Please read below to learn more about the medications, dosages and frequencies that I recommend to help alleviate your symptoms and to get you feeling better soon:   Omnicef (cefdinir):  1 capsule twice daily for 10 days, you can take it with or without food.  This antibiotic can cause upset stomach, this will resolve once antibiotics are complete.  You are welcome to use a probiotic, eat yogurt, take Imodium while taking this medication.  Please avoid other systemic medications such as Maalox, Pepto-Bismol or milk of magnesia as they can interfere with your body's ability to absorb the antibiotics.   Advil, Motrin (ibuprofen): This is a good anti-inflammatory medication which addresses aches, pains and inflammation of the upper airways that causes sinus and nasal congestion as well as in the lower airways which makes your cough feel tight and sometimes burn.  This will also help reduce the ringing sensation in your left ear.  I recommend that you take between 400 to 600 mg every 6-8 hours as needed.      Please follow-up within the next 5-7 days either with your primary care provider or urgent care if your symptoms do not meaningfully improve.          Thank you for visiting urgent care today.  We appreciate the opportunity to participate in your care.

## 2022-04-17 ENCOUNTER — Ambulatory Visit (INDEPENDENT_AMBULATORY_CARE_PROVIDER_SITE_OTHER): Payer: Medicaid Other | Admitting: Internal Medicine

## 2022-04-17 ENCOUNTER — Encounter: Payer: Self-pay | Admitting: Internal Medicine

## 2022-04-17 VITALS — BP 110/68 | HR 84 | Resp 16 | Ht 63.75 in | Wt 158.0 lb

## 2022-04-17 DIAGNOSIS — M791 Myalgia, unspecified site: Secondary | ICD-10-CM | POA: Diagnosis not present

## 2022-04-17 DIAGNOSIS — H9312 Tinnitus, left ear: Secondary | ICD-10-CM | POA: Diagnosis not present

## 2022-04-17 DIAGNOSIS — H9192 Unspecified hearing loss, left ear: Secondary | ICD-10-CM | POA: Diagnosis not present

## 2022-04-17 MED ORDER — AMLODIPINE BESYLATE 10 MG PO TABS: 10.0000 mg | ORAL_TABLET | Freq: Every day | ORAL | 3 refills | Status: DC

## 2022-04-17 MED ORDER — ROSUVASTATIN CALCIUM 10 MG PO TABS: 10.0000 mg | ORAL_TABLET | Freq: Every day | ORAL | 11 refills | Status: DC

## 2022-04-17 NOTE — Progress Notes (Signed)
Subjective:    Patient ID: Jacqueline Fox, female   DOB: November 20, 1955, 66 y.o.   MRN: [id number]   HPI   Leg cramps:  pain in legs resolved after stopping Atorvastatin for 3 days.  Did not restart Atorvastatin after pain relieved.  Willing to try Rosuvastatin and see if has similar issues.  Was on Atorvastatin for almost a year before leg pain.  Leg pain lasted for about 1 week before was seen in October.  CPK, TSH, ESR were all normal at her CPE.  2.  URI symptoms with left ear pain and  low grinding noise in her left ear with decreased hearing for which she went to urgent care for.  Was diagnosed with acute LOM.  No impacted cerumen on exam.  As left the urgent care, her left ear started draining what she felt was blood.  No odor.   Was started on Cefdinir 300 mg twice daily for 10 days and she continues on that.   Has 2.5 more days of treatment.  Pain and UR congestion is resolved, but still with decreased hearing and the tinnitus.  3.  Switching to mail delivery with meds--needs new statin and amlodipine sent  Current Meds  Medication Sig   alendronate (FOSAMAX) 70 MG tablet Take 70 mg by mouth once a week. Take with a full glass of water on an empty stomach.   amLODipine (NORVASC) 10 MG tablet Take 1 tablet by mouth once daily   aspirin EC 81 MG EC tablet Take 1 tablet (81 mg total) by mouth daily. Swallow whole.   calcium citrate-vitamin D 500-500 MG-UNIT chewable tablet 1 tab by mouth twice daily   cefdinir (OMNICEF) 300 MG capsule Take 1 capsule (300 mg total) by mouth 2 (two) times daily for 10 days.   Fluocinolone Acetonide Scalp 0.01 % OIL APPLY TO SCALP AT BEDTIME AS PER INSTRUCTIONS   Multiple Vitamins-Minerals (PRESERVISION AREDS 2) CAPS Take by mouth 2 (two) times daily.   omeprazole (PRILOSEC) 20 MG capsule Take 20 mg by mouth daily.   predniSONE (DELTASONE) 5 MG tablet Take 5 mg by mouth daily with breakfast.   Tocilizumab 162 MG/0.9ML SOAJ Inject into the skin once a  week.   triamcinolone (KENALOG) 0.025 % ointment Apply scant amount to affected areas once daily as needed. (Patient taking differently: Apply 1 application  topically. Apply 1 application to body once daily as needed.)   No Known Allergies   Review of Systems    Objective:   BP 110/68 (BP Location: Left Arm, Patient Position: Sitting, Cuff Size: Normal)   Pulse 84   Resp 16   Ht 5' 3.75" (1.619 m)   Wt 158 lb (71.7 kg)   LMP 02/27/2011   BMI 27.33 kg/m   Physical Exam NAD HEENT:  PERRL, EOMI, Left TM with superior portion covered in dried blood.  Lower TM without erythema.  Right TM with fluid behind and dull.  Throat without injection.   Neck:  Supple, No adenopathy Chest:  CTA CV:  RRR    Assessment & Plan    Leg pain:  Resolved.  Highly likely due to statin.  Start Rosuvastatin 10 mg daily.  To call if pain redevelops.  Will check cholesterol again when follows up in spring  2.  Left acute OM with likely rupture of TM with immediate relief of her ear pain, though no obvious perf noted today.  Suspect loss of hearing due to eustachian tube dysfunction and  perhaps continued effusion, but seems to be healing with antibiotics.  Recommended soft music in background when in quiet room.  Will refer to Dr. Janace Hoard, ENT to be sure she is progressing appropriately.  3.  HM:  encouraged her to hold her med one week in January until she can get Pneumococcal 20 and Shingrix that week, then start back up again.

## 2022-04-17 NOTE — Patient Instructions (Signed)
Recommend in January, holding your actemra and getting your Pneumococcal 20 and Shingrix.   Repeat Shingrix in 6 months

## 2022-04-25 ENCOUNTER — Ambulatory Visit: Payer: Medicaid Other | Admitting: Internal Medicine

## 2022-05-23 ENCOUNTER — Ambulatory Visit
Admission: RE | Admit: 2022-05-23 | Discharge: 2022-05-23 | Disposition: A | Discharge status: Home / Self Care | Payer: Commercial Managed Care - HMO | Source: Ambulatory Visit | Attending: Internal Medicine | Admitting: Internal Medicine

## 2022-05-23 DIAGNOSIS — Z1231 Encounter for screening mammogram for malignant neoplasm of breast: Secondary | ICD-10-CM

## 2022-06-27 ENCOUNTER — Ambulatory Visit
Admission: RE | Admit: 2022-06-27 | Discharge: 2022-06-27 | Disposition: A | Discharge status: Home / Self Care | Payer: Commercial Managed Care - HMO | Source: Ambulatory Visit | Attending: Internal Medicine | Admitting: Internal Medicine

## 2022-06-27 ENCOUNTER — Other Ambulatory Visit: Payer: Self-pay

## 2022-06-27 DIAGNOSIS — R7989 Other specified abnormal findings of blood chemistry: Secondary | ICD-10-CM

## 2022-06-27 DIAGNOSIS — Z780 Asymptomatic menopausal state: Secondary | ICD-10-CM

## 2022-06-27 MED ORDER — AMLODIPINE BESYLATE 10 MG PO TABS: 10.0000 mg | ORAL_TABLET | Freq: Every day | ORAL | 3 refills | Status: DC

## 2022-06-27 MED ORDER — ROSUVASTATIN CALCIUM 10 MG PO TABS: 10.0000 mg | ORAL_TABLET | Freq: Every day | ORAL | 11 refills | Status: DC

## 2022-07-04 ENCOUNTER — Other Ambulatory Visit (INDEPENDENT_AMBULATORY_CARE_PROVIDER_SITE_OTHER): Payer: Medicaid Other

## 2022-07-04 DIAGNOSIS — R7989 Other specified abnormal findings of blood chemistry: Secondary | ICD-10-CM

## 2022-07-05 LAB — VITAMIN D 25 HYDROXY (VIT D DEFICIENCY, FRACTURES): Vit D, 25-Hydroxy: 28.3 ng/mL — ABNORMAL LOW (ref 30.0–100.0)

## 2022-07-09 ENCOUNTER — Other Ambulatory Visit: Payer: Medicaid Other

## 2022-08-27 ENCOUNTER — Encounter: Payer: Self-pay | Admitting: Internal Medicine

## 2022-08-27 ENCOUNTER — Ambulatory Visit (INDEPENDENT_AMBULATORY_CARE_PROVIDER_SITE_OTHER): Payer: Medicaid Other | Admitting: Internal Medicine

## 2022-08-27 VITALS — BP 130/76 | HR 79 | Resp 16 | Ht 63.0 in | Wt 161.0 lb

## 2022-08-27 DIAGNOSIS — L930 Discoid lupus erythematosus: Secondary | ICD-10-CM

## 2022-08-27 DIAGNOSIS — I10 Essential (primary) hypertension: Secondary | ICD-10-CM

## 2022-08-27 DIAGNOSIS — Z7952 Long term (current) use of systemic steroids: Secondary | ICD-10-CM

## 2022-08-27 DIAGNOSIS — M316 Other giant cell arteritis: Secondary | ICD-10-CM

## 2022-08-27 DIAGNOSIS — E782 Mixed hyperlipidemia: Secondary | ICD-10-CM

## 2022-08-27 DIAGNOSIS — H9192 Unspecified hearing loss, left ear: Secondary | ICD-10-CM | POA: Insufficient documentation

## 2022-08-27 DIAGNOSIS — H9312 Tinnitus, left ear: Secondary | ICD-10-CM

## 2022-08-27 MED ORDER — CITRACAL PETITES/VITAMIN D 200-6.25 MG-MCG PO TABS: ORAL_TABLET | ORAL | Status: DC

## 2022-08-27 NOTE — Progress Notes (Signed)
Subjective:    Patient ID: Jacqueline Fox, female   DOB: December 02, 1955, 67 y.o.   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  She also is continuing on Actemra.  Not clear how long she will be taking the latter.  She states perhaps lifelong.  Sees shadows only in left eye.   Recent DEXA normal in February.  No problems with heartburn on Fosamax.  She is taking calcium and Vitamin D twice daily at half dose.    2.  Hypertension:  no problems with meds.     3.  Decreased hearing in left ear.  States it is better.  She has tinnitus only rarely--squeak or hum.    4.  HM:  Recommending new COVID vaccine, Shingrix, and pneumococcal 20.  Patient states she was told by Dr. Fredderick Phenix, Rheum to come off her Actemra for a week when receiving her vaccines.  Called and left message with his assistant to confirm.  Also, asked how long he expected she would require treatment with Actemra for Temporal Arteritis.  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  Current Meds  Medication Sig   alendronate (FOSAMAX) 70 MG tablet Take 70 mg by mouth once a week. Take with a full glass of water on an empty stomach.   amLODipine (NORVASC) 10 MG tablet Take 1 tablet (10 mg total) by mouth daily.   aspirin EC 81 MG EC tablet Take 1 tablet (81 mg total) by mouth daily. Swallow whole.   calcium citrate-vitamin D 500-500 MG-UNIT chewable tablet 1 tab by mouth twice daily   Fluocinolone Acetonide Scalp 0.01 % OIL APPLY TO SCALP AT BEDTIME AS PER INSTRUCTIONS   Multiple Vitamins-Minerals (PRESERVISION AREDS 2) CAPS Take by mouth 2 (two) times daily.   Multiple Vitamins-Minerals (PRESERVISION AREDS PO) Take by mouth in the morning and at bedtime.   omeprazole (PRILOSEC) 20 MG capsule Take 20 mg by mouth daily.    predniSONE (DELTASONE) 5 MG tablet Take 5 mg by mouth daily with breakfast.   rosuvastatin (CRESTOR) 10 MG tablet Take 1 tablet (10 mg total) by mouth daily.   Tocilizumab 162 MG/0.9ML SOAJ Inject into the skin once a week.   triamcinolone (KENALOG) 0.025 % ointment Apply scant amount to affected areas once daily as needed. (Patient taking differently: Apply 1 application  topically. Apply 1 application to body once daily as needed.)   No Known Allergies      Review of Systems    Objective:   BP 130/76 (BP Location: Left Arm, Patient Position: Sitting, Cuff Size: Normal)   Pulse 79   Resp 16   Ht  (1.6 m)   Wt 161 lb (73 kg)   LMP 02/27/2011   BMI 28.52 kg/m   Physical Exam HEENT:  PERRL, EOMI, TMs pearly gray, throat without injection. Neck:  Supple, No adenopathy Chest:  CTA CV:  RRR without murmur or rub.  No carotid bruits.  Carotid, radial and DP pulses normal and equal Abd:  S, NT, No HSm or mass.  + BS LE:  No edema. Skin:  lesions and loss of pigment appears improved.   Assessment & Plan   Temporal Arteritis:  Call into Dr. Fredderick Phenix for what he expects  for length of treatment with Actemra.  Also regarding hold for giving vaccinations.  Continues on Fosamax while on Prednisone.  Tapering off the latter.  2.  Low Vitamin D:  Not clear what dose of Vitamin D she was taking at time this was checked in February.  Return in 1 month to recheck level.  She is to double her dose of Citracal with D to 2 tabs twice daily, which should give her 800 IU of Vitamin D daily.  3.  Hypertension:  controlled.  4.  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  She is to call this time if she is not contacted in next 2 weeks for appt.  5.  Hyperlipidemia:  we switched her to Rosuvastatin last visit due to muscle pain.  She is not fasting today.  Return in 1 month to recheck levels/CMP.  6.  Discoid lupus:   controlled.

## 2022-09-24 ENCOUNTER — Other Ambulatory Visit (INDEPENDENT_AMBULATORY_CARE_PROVIDER_SITE_OTHER): Payer: Medicaid Other

## 2022-09-24 DIAGNOSIS — R7989 Other specified abnormal findings of blood chemistry: Secondary | ICD-10-CM

## 2022-09-24 DIAGNOSIS — Z79899 Other long term (current) drug therapy: Secondary | ICD-10-CM

## 2022-09-24 DIAGNOSIS — E782 Mixed hyperlipidemia: Secondary | ICD-10-CM

## 2022-09-25 LAB — COMPREHENSIVE METABOLIC PANEL
ALT: 35 IU/L — ABNORMAL HIGH (ref 0–32)
AST: 32 IU/L (ref 0–40)
Albumin/Globulin Ratio: 1.9 (ref 1.2–2.2)
Albumin: 4.7 g/dL (ref 3.9–4.9)
Alkaline Phosphatase: 52 IU/L (ref 44–121)
BUN/Creatinine Ratio: 22 (ref 12–28)
BUN: 10 mg/dL (ref 8–27)
Bilirubin Total: 0.3 mg/dL (ref 0.0–1.2)
CO2: 23 mmol/L (ref 20–29)
Calcium: 9.5 mg/dL (ref 8.7–10.3)
Chloride: 103 mmol/L (ref 96–106)
Creatinine, Ser: 0.46 mg/dL — ABNORMAL LOW (ref 0.57–1.00)
Globulin, Total: 2.5 g/dL (ref 1.5–4.5)
Glucose: 98 mg/dL (ref 70–99)
Potassium: 3.3 mmol/L — ABNORMAL LOW (ref 3.5–5.2)
Sodium: 142 mmol/L (ref 134–144)
Total Protein: 7.2 g/dL (ref 6.0–8.5)
eGFR: 107 mL/min/{1.73_m2} (ref >59)

## 2022-09-25 LAB — LIPID PANEL W/O CHOL/HDL RATIO
Cholesterol, Total: 204 mg/dL — ABNORMAL HIGH (ref 100–199)
HDL: 78 mg/dL (ref >39)
LDL Chol Calc (NIH): 99 mg/dL (ref 0–99)
Triglycerides: 159 mg/dL — ABNORMAL HIGH (ref 0–149)
VLDL Cholesterol Cal: 27 mg/dL (ref 5–40)

## 2022-09-25 LAB — VITAMIN D 25 HYDROXY (VIT D DEFICIENCY, FRACTURES): Vit D, 25-Hydroxy: 35.8 ng/mL (ref 30.0–100.0)

## 2022-10-29 ENCOUNTER — Other Ambulatory Visit (INDEPENDENT_AMBULATORY_CARE_PROVIDER_SITE_OTHER): Payer: Medicaid Other

## 2022-10-29 DIAGNOSIS — R748 Abnormal levels of other serum enzymes: Secondary | ICD-10-CM

## 2022-10-30 LAB — HEPATITIS B CORE ANTIBODY, TOTAL: Hep B Core Total Ab: POSITIVE — AB

## 2022-10-30 LAB — HEPATITIS A ANTIBODY, TOTAL: hep A Total Ab: NEGATIVE

## 2022-10-30 LAB — HEPATITIS B SURFACE ANTIBODY,QUALITATIVE: Hep B Surface Ab, Qual: REACTIVE

## 2022-10-30 LAB — HEPATITIS B SURFACE ANTIGEN: Hepatitis B Surface Ag: NEGATIVE

## 2023-02-22 ENCOUNTER — Encounter: Payer: Self-pay | Admitting: Internal Medicine

## 2023-02-22 ENCOUNTER — Telehealth: Payer: Self-pay | Admitting: Internal Medicine

## 2023-02-24 NOTE — Telephone Encounter (Signed)
Note has been added.

## 2023-02-26 ENCOUNTER — Ambulatory Visit: Payer: Medicaid Other | Admitting: Internal Medicine

## 2023-02-26 ENCOUNTER — Encounter: Payer: Self-pay | Admitting: Internal Medicine

## 2023-02-26 VITALS — BP 136/80 | HR 96 | Resp 16 | Ht 63.0 in | Wt 160.0 lb

## 2023-02-26 DIAGNOSIS — E782 Mixed hyperlipidemia: Secondary | ICD-10-CM

## 2023-02-26 DIAGNOSIS — Z716 Tobacco abuse counseling: Secondary | ICD-10-CM

## 2023-02-26 DIAGNOSIS — Z1231 Encounter for screening mammogram for malignant neoplasm of breast: Secondary | ICD-10-CM

## 2023-02-26 DIAGNOSIS — F17200 Nicotine dependence, unspecified, uncomplicated: Secondary | ICD-10-CM

## 2023-02-26 DIAGNOSIS — Z0000 Encounter for general adult medical examination without abnormal findings: Secondary | ICD-10-CM

## 2023-02-26 MED ORDER — PRESERVISION AREDS 2 PO CAPS: ORAL_CAPSULE | ORAL | 3 refills | Status: AC

## 2023-02-26 MED ORDER — TRIAMCINOLONE ACETONIDE 0.025 % EX OINT: TOPICAL_OINTMENT | CUTANEOUS | 2 refills | Status: AC

## 2023-02-26 MED ORDER — NICOTINE 14 MG/24HR TD PT24: MEDICATED_PATCH | TRANSDERMAL | 0 refills | Status: DC

## 2023-02-26 MED ORDER — DERMA-SMOOTHE/FS SCALP 0.01 % EX OIL: TOPICAL_OIL | CUTANEOUS | 4 refills | Status: AC

## 2023-02-26 MED ORDER — OMEPRAZOLE 20 MG PO CPDR: 20.0000 mg | DELAYED_RELEASE_CAPSULE | Freq: Every day | ORAL | 3 refills | Status: DC

## 2023-02-26 MED ORDER — NICOTINE 7 MG/24HR TD PT24: 7.0000 mg | MEDICATED_PATCH | Freq: Every day | TRANSDERMAL | 0 refills | Status: DC

## 2023-02-26 MED ORDER — ASPIRIN 81 MG PO TBEC: 81.0000 mg | DELAYED_RELEASE_TABLET | Freq: Every day | ORAL | Status: AC

## 2023-02-26 MED ORDER — NICOTINE POLACRILEX 2 MG MT LOZG: 2.0000 mg | LOZENGE | OROMUCOSAL | 6 refills | Status: DC | PRN

## 2023-02-26 MED ORDER — CITRACAL PETITES/VITAMIN D 200-6.25 MG-MCG PO TABS: ORAL_TABLET | ORAL | Status: AC

## 2023-02-26 MED ORDER — ALENDRONATE SODIUM 70 MG PO TABS: 70.0000 mg | ORAL_TABLET | ORAL | 3 refills | Status: DC

## 2023-02-26 NOTE — Progress Notes (Signed)
    Subjective:    Patient ID: Jacqueline Fox, female   DOB: 1956/01/20, 67 y.o.   MRN: [id number]   HPI  Current Meds  Medication Sig  . alendronate (FOSAMAX) 70 MG tablet Take 70 mg by mouth once a week. Take with a full glass of water on an empty stomach.  Marland Kitchen amLODipine (NORVASC) 10 MG tablet Take 1 tablet (10 mg total) by mouth daily.  Marland Kitchen aspirin EC 81 MG EC tablet Take 1 tablet (81 mg total) by mouth daily. Swallow whole.  . Calcium Citrate-Vitamin D (CITRACAL PETITES/VITAMIN D) 200-6.25 MG-MCG TABS 2 caps by mouth twice daily  . Fluocinolone Acetonide Scalp 0.01 % OIL APPLY TO SCALP AT BEDTIME AS PER INSTRUCTIONS  . Multiple Vitamins-Minerals (PRESERVISION AREDS 2) CAPS Take by mouth 2 (two) times daily.  Marland Kitchen omeprazole (PRILOSEC) 20 MG capsule Take 20 mg by mouth daily.  . predniSONE (DELTASONE) 5 MG tablet Take 5 mg by mouth daily with breakfast.  . rosuvastatin (CRESTOR) 10 MG tablet Take 1 tablet (10 mg total) by mouth daily.  . Tocilizumab 162 MG/0.9ML SOAJ Inject into the skin once a week.  . triamcinolone (KENALOG) 0.025 % ointment Apply scant amount to affected areas once daily as needed. (Patient taking differently: Apply 1 application  topically. Apply 1 application to body once daily as needed.)   No Known Allergies   Review of Systems    Objective:   BP 136/80 (BP Location: Left Arm, Patient Position: Sitting, Cuff Size: Normal)   Pulse 96   Resp 16   Ht 5\' 3"  (1.6 m)   Wt 160 lb (72.6 kg)   LMP 02/27/2011   BMI 28.34 kg/m   Physical Exam   Assessment & Plan

## 2023-02-26 NOTE — Progress Notes (Cosign Needed)
Subjective   Patient ID: Jacqueline Fox, female    DOB: April 23, 1956  Age: 67 y.o. MRN: [id number]  Chief Complaint  Patient presents with  . Annual Exam    HPI  CPE without pap  1.  Pap: Normal 02/15/2021.  2.  Mammogram: Normal 05/25/2022.   3.  Osteoprevention: Taking Alendronate with long time corticosteroid use, postmenopausal, smoker. Also taking Calcium and Vitamin D supplementation. Attends exercise classes twice a week.  4.  Guaiac Cards: Guaiac Card/FIT negative for blood in stool 02/2022  5.  Colonoscopy: Last colonoscopy 02/2018 with Dr. Matthias Hughs, without adenomatous polyps. Repeat in 2029.  6.  Immunizations: Had influenza vaccine and tetanus booster at [facility] 01/2023. Would like to get COVID vaccine. Also needs hepatitis A, shingles, and pneumococcal vaccine.  7.  Glucose/Cholesterol:  History of hyperglycemia but normal A1C at 5.2% in 2020. History of hyperlipidemia, most recent total cholesterol mildly elevated at 204 on Rosuvastatin.  Lipid Panel     Component Value Date/Time   CHOL 204 (H) 09/24/2022 0857   TRIG 159 (H) 09/24/2022 0857   HDL 78 09/24/2022 0857   CHOLHDL 3.6 04/03/2021 1930   VLDL 27 04/03/2021 1930   LDLCALC 99 09/24/2022 0857   LDLDIRECT 158.6 (H) 04/03/2021 1930   LABVLDL 27 09/24/2022 0857    No Known Allergies   Current Meds  Medication Sig  . alendronate (FOSAMAX) 70 MG tablet Take 70 mg by mouth once a week. Take with a full glass of water on an empty stomach.  Marland Kitchen amLODipine (NORVASC) 10 MG tablet Take 1 tablet (10 mg total) by mouth daily.  Marland Kitchen aspirin EC 81 MG EC tablet Take 1 tablet (81 mg total) by mouth daily. Swallow whole.  . Calcium Citrate-Vitamin D (CITRACAL PETITES/VITAMIN D) 200-6.25 MG-MCG TABS 2 caps by mouth twice daily  . Fluocinolone Acetonide Scalp 0.01 % OIL APPLY TO SCALP AT BEDTIME AS PER INSTRUCTIONS  . Multiple Vitamins-Minerals (PRESERVISION AREDS 2) CAPS Take by mouth 2 (two) times daily.  Marland Kitchen omeprazole  (PRILOSEC) 20 MG capsule Take 20 mg by mouth daily.  . predniSONE (DELTASONE) 5 MG tablet Take 5 mg by mouth daily with breakfast.  . rosuvastatin (CRESTOR) 10 MG tablet Take 1 tablet (10 mg total) by mouth daily.  . Tocilizumab 162 MG/0.9ML SOAJ Inject into the skin once a week.  . triamcinolone (KENALOG) 0.025 % ointment Apply scant amount to affected areas once daily as needed. (Patient taking differently: Apply 1 application  topically. Apply 1 application to body once daily as needed.)    Past Medical History:  Diagnosis Date  . Discoid lupus    Follows with Derm  . Family history of breast cancer   . Family history of cholangiocarcinoma    Sister  . Family history of colon cancer    Patient has had genetic testing and not found to have genetic predisposition.  Recommendations for cancer screenings as usual per Johney Maine her note 2019 please  . Family history of lung cancer    Brother  . Family history of prostate cancer   . Genetic testing 11/27/2017   Negative genetic testing on the Multicancer panel.  The Multi-Gene Panel offered by [facility] includes sequencing and/or deletion duplication testing of the following 84 genes: AIP, ALK, APC, ATM, AXIN2,BAP1,  BARD1, BLM, BMPR1A, BRCA1, BRCA2, BRIP1, CASR, CDC73, CDH1, CDK4, CDKN1B, CDKN1C, CDKN2A (p14ARF), CDKN2A (p16INK4a), CEBPA, CHEK2, CTNNA1, DICER1, DIS3L2, EGFR (c.2369C>T, p.Thr790Met variant  . Hyperlipidemia   .  Need for prophylactic vaccination and inoculation against viral hepatitis    Needs A vaccination next visit  . Primary hypertension     Past Surgical History:  Procedure Laterality Date  . COLONOSCOPY WITH PROPOFOL N/A 02/12/2018   Procedure: COLONOSCOPY WITH PROPOFOL;  Surgeon: Bernette Redbird, MD;  Location: [facility];  Service: Endoscopy;  Laterality: N/A;  . TEMPORAL ARTERY BIOPSY / LIGATION Left 05/2021    Family History  Problem Relation Age of Onset  . Diabetes Mother   . Heart disease Mother         CHF  . Hypertension Mother   . Kidney disease Mother   . Arthritis Mother        OA  . Dementia Mother   . Cancer Father 51       colon and prostate  . Cancer Sister 31       ?cholangiocarcinoma  . Cancer Brother 12       lung--smoker  also, prostate CA when 31-52 yo  . Cancer Brother 74       prostate  . Cancer Brother 4       Prostate--treated successfuly  . Diabetes Son   . Kidney disease Son        Stage IV CKD from poorly controlled Htn  . Hypertension Son   . Kidney disease Paternal Aunt        peritoneal dialysis--not clear what diagnosis  . Cancer Paternal Aunt 13       Breast Cancer  . Breast cancer Paternal Aunt   . Cancer Paternal Aunt        Bone cancer  . Heart disease Paternal Aunt   . Stroke Paternal Grandmother   . Prostate cancer Cousin        mat first cousin  . Cancer Other 59       colon--sister's daughter (sister with "gallbladder duct" cancer)     Social History   Socioeconomic History  . Marital status: Media planner    Spouse name: Rogene Houston  . Number of children: 4  . Years of education: 1  . Highest education level: Associate degree: academic program  Occupational History  . Occupation: Retired    Comment: Not a paid position  Tobacco Use  . Smoking status: Every Day    Current packs/day: 0.25    Average packs/day: 0.3 packs/day for 43.0 years (10.8 ttl pk-yrs)    Types: Cigarettes  . Smokeless tobacco: Never  . Tobacco comments:    Not clear if she is ready to quit, despite family history of cancers.  No interest in support.  Vaping Use  . Vaping status: Never Used  Substance and Sexual Activity  . Alcohol use: Yes    Comment: Drinking 2 glasses wine over ice daily.  . Drug use: Yes    Types: Marijuana    Comment: She tried edibles, but froze her up.  She smokes a bit daily.  Marland Kitchen Sexual activity: Not Currently  Other Topics Concern  . Not on file  Social History Narrative   Has been together with female partner  for 30 years (2024).   He is now being treated for prostate cancer.   He does not smoke.     Currently just lives with her partner, Criss Alvine   Social Determinants of Health   Financial Resource Strain: Low Risk  (02/26/2023)   Overall Financial Resource Strain (CARDIA)   . Difficulty of Paying Living Expenses: Not hard at all  Food Insecurity:  No Food Insecurity (02/26/2023)   Hunger Vital Sign   . Worried About Programme researcher, broadcasting/film/video in the Last Year: Never true   . Ran Out of Food in the Last Year: Never true  Transportation Needs: No Transportation Needs (02/26/2023)   PRAPARE - Transportation   . Lack of Transportation (Medical): No   . Lack of Transportation (Non-Medical): No  Physical Activity: Inactive (02/11/2018)   Exercise Vital Sign   . Days of Exercise per Week: 0 days   . Minutes of Exercise per Session: 0 min  Stress: No Stress Concern Present (08/20/2017)   [employer] of Occupational Health - Occupational Stress Questionnaire   . Feeling of Stress : Only a little  Social Connections: Socially Integrated (02/11/2018)   Social Connection and Isolation Panel [NHANES]   . Frequency of Communication with Friends and Family: More than three times a week   . Frequency of Social Gatherings with Friends and Family: More than three times a week   . Attends Religious Services: More than 4 times per year   . Active Member of Clubs or Organizations: Yes   . Attends Banker Meetings: More than 4 times per year   . Marital Status: Living with partner  Intimate Partner Violence: Not At Risk (02/26/2023)   Humiliation, Afraid, Rape, and Kick questionnaire   . Fear of Current or Ex-Partner: No   . Emotionally Abused: No   . Physically Abused: No   . Sexually Abused: No     Review of Systems  HENT:         Negative for dental problems. Has not had a recent dental exam since getting Medicaid.  Eyes:        Negative for visual disturbances.  Respiratory:  Negative  for shortness of breath.   Cardiovascular:  Negative for chest pain, palpitations and leg swelling.  Gastrointestinal:  Negative for abdominal pain, blood in stool, heartburn and melena.  Skin: Negative.   Neurological:  Negative for dizziness, tingling and headaches.  Psychiatric/Behavioral:  Negative for depression. The patient is not nervous/anxious.        Objective:     BP 136/80 (BP Location: Left Arm, Patient Position: Sitting, Cuff Size: Normal)   Pulse 96   Resp 16   Ht 5\' 3"  (1.6 m)   Wt 160 lb (72.6 kg)   LMP 02/27/2011   BMI 28.34 kg/m    Physical Exam Constitutional:      General: She is not in acute distress. HENT:     Head: Normocephalic and atraumatic.     Right Ear: Tympanic membrane, ear canal and external ear normal.     Left Ear: Tympanic membrane, ear canal and external ear normal.     Nose: Nose normal.     Mouth/Throat:     Lips: Pink.     Mouth: Mucous membranes are moist.     Pharynx: Oropharynx is clear. No posterior oropharyngeal erythema.  Eyes:     Extraocular Movements: Extraocular movements intact.     Conjunctiva/sclera: Conjunctivae normal.     Pupils: Pupils are equal, round, and reactive to light.     Funduscopic exam:    Right eye: Red reflex present.        Left eye: Red reflex present. Neck:     Thyroid: No thyroid mass or thyromegaly.     Vascular: No carotid bruit.  Cardiovascular:     Rate and Rhythm: Normal rate and regular rhythm.  Chest Wall: PMI is not displaced. No thrill.     Pulses: Normal pulses.     Heart sounds: S1 normal and S2 normal. Murmur (SEM radiating to right second intercostal space. Faintly heard over carotids) heard.  Pulmonary:     Effort: Pulmonary effort is normal.     Breath sounds: Normal breath sounds.  Abdominal:     General: Bowel sounds are normal.     Palpations: Abdomen is soft. There is no hepatomegaly or splenomegaly.     Tenderness: There is no abdominal tenderness.     Hernia: No  hernia is present.  Musculoskeletal:     Cervical back: Normal, normal range of motion and neck supple. No tenderness.     Right knee: Normal.     Left knee: Normal.     Right lower leg: Normal. No edema.     Left lower leg: Normal. No edema.  Lymphadenopathy:     Head:     Right side of head: No submental or submandibular adenopathy.     Left side of head: No submental or submandibular adenopathy.     Cervical: No cervical adenopathy.     Upper Body:     Right upper body: No supraclavicular adenopathy.     Left upper body: No supraclavicular adenopathy.     Lower Body: No right inguinal adenopathy. No left inguinal adenopathy.  Skin:    General: Skin is warm and dry.  Neurological:     General: No focal deficit present.     Mental Status: She is alert and oriented to person, place, and time.     Cranial Nerves: Cranial nerves 2-12 are intact.     Sensory: Sensation is intact.     Motor: Motor function is intact.     Coordination: Coordination is intact.     Gait: Gait is intact.     Deep Tendon Reflexes: Reflexes are normal and symmetric.  Psychiatric:        Attention and Perception: Attention normal.        Mood and Affect: Mood normal.        Speech: Speech normal.        Behavior: Behavior normal.     Assessment & Plan:    CPE without pap:  Will need pap smear in 2025.       Repeat Mammogram in January 2025       Check non-fasting lipid panel today.          2.   Heart Murmur:  Echo on 03/2021 normal.   3.   Tobacco Use: Low density CT Chest Lung Cancer screening ordered. Discussed using nicotine gum to aid with smoking cessation.  4.    Giant Cell Arteritis: DEXA scan 06/2022 normal. Seen by Rheumatologist Isabelle Course, PA on 12/2022 and taking Actemra, prednisone, alendronate, calcium, and vitamin D.   5.    HM: Vaccinations on immunosuppressants, Tocilizumab and chronic prednisone. Will hold Tocilizumab this Sunday and return Tuesday for COVID and Hepatitis A  vaccine.    Macarthur Critchley, Haroldine Laws FNP Student

## 2023-03-04 ENCOUNTER — Ambulatory Visit (INDEPENDENT_AMBULATORY_CARE_PROVIDER_SITE_OTHER): Payer: Medicaid Other | Admitting: Internal Medicine

## 2023-03-04 DIAGNOSIS — Z23 Encounter for immunization: Secondary | ICD-10-CM

## 2023-03-04 LAB — LIPID PANEL W/O CHOL/HDL RATIO
Cholesterol, Total: 221 mg/dL — ABNORMAL HIGH (ref 100–199)
HDL: 79 mg/dL (ref >39)
LDL Chol Calc (NIH): 93 mg/dL (ref 0–99)
Triglycerides: 300 mg/dL — ABNORMAL HIGH (ref 0–149)
VLDL Cholesterol Cal: 49 mg/dL — ABNORMAL HIGH (ref 5–40)

## 2023-03-12 ENCOUNTER — Ambulatory Visit
Admission: RE | Admit: 2023-03-12 | Discharge: 2023-03-12 | Disposition: A | Discharge status: Home / Self Care | Payer: Medicaid Other | Source: Ambulatory Visit | Attending: Internal Medicine | Admitting: Internal Medicine

## 2023-03-12 DIAGNOSIS — F17200 Nicotine dependence, unspecified, uncomplicated: Secondary | ICD-10-CM

## 2023-04-24 ENCOUNTER — Ambulatory Visit (INDEPENDENT_AMBULATORY_CARE_PROVIDER_SITE_OTHER): Payer: Medicare Other | Admitting: Internal Medicine

## 2023-04-24 DIAGNOSIS — Z23 Encounter for immunization: Secondary | ICD-10-CM

## 2023-05-27 ENCOUNTER — Ambulatory Visit
Admission: RE | Admit: 2023-05-27 | Discharge: 2023-05-27 | Disposition: A | Discharge status: Home / Self Care | Payer: Medicare Other | Source: Ambulatory Visit | Attending: Internal Medicine | Admitting: Internal Medicine

## 2023-05-27 DIAGNOSIS — Z1231 Encounter for screening mammogram for malignant neoplasm of breast: Secondary | ICD-10-CM

## 2023-07-25 ENCOUNTER — Other Ambulatory Visit: Payer: Self-pay | Admitting: Internal Medicine

## 2023-08-14 ENCOUNTER — Other Ambulatory Visit: Payer: Self-pay | Admitting: Internal Medicine

## 2023-08-25 ENCOUNTER — Other Ambulatory Visit: Payer: Medicare Other

## 2023-08-25 DIAGNOSIS — E782 Mixed hyperlipidemia: Secondary | ICD-10-CM | POA: Diagnosis not present

## 2023-08-26 LAB — LIPID PANEL W/O CHOL/HDL RATIO
Cholesterol, Total: 201 mg/dL — ABNORMAL HIGH (ref 100–199)
HDL: 67 mg/dL (ref >39)
LDL Chol Calc (NIH): 106 mg/dL — ABNORMAL HIGH (ref 0–99)
Triglycerides: 160 mg/dL — ABNORMAL HIGH (ref 0–149)
VLDL Cholesterol Cal: 28 mg/dL (ref 5–40)

## 2023-08-28 ENCOUNTER — Ambulatory Visit (INDEPENDENT_AMBULATORY_CARE_PROVIDER_SITE_OTHER): Payer: Medicare Other | Admitting: Internal Medicine

## 2023-08-28 ENCOUNTER — Encounter: Payer: Self-pay | Admitting: Internal Medicine

## 2023-08-28 VITALS — BP 130/78 | HR 90 | Resp 16 | Ht 63.0 in | Wt 160.0 lb

## 2023-08-28 DIAGNOSIS — F17200 Nicotine dependence, unspecified, uncomplicated: Secondary | ICD-10-CM

## 2023-08-28 DIAGNOSIS — E782 Mixed hyperlipidemia: Secondary | ICD-10-CM | POA: Diagnosis not present

## 2023-08-28 DIAGNOSIS — L930 Discoid lupus erythematosus: Secondary | ICD-10-CM

## 2023-08-28 DIAGNOSIS — R739 Hyperglycemia, unspecified: Secondary | ICD-10-CM | POA: Diagnosis not present

## 2023-08-28 DIAGNOSIS — I10 Essential (primary) hypertension: Secondary | ICD-10-CM | POA: Diagnosis not present

## 2023-08-28 DIAGNOSIS — M316 Other giant cell arteritis: Secondary | ICD-10-CM

## 2023-08-28 MED ORDER — ROSUVASTATIN CALCIUM 10 MG PO TABS: 10.0000 mg | ORAL_TABLET | Freq: Every day | ORAL | 3 refills | Status: DC

## 2023-08-28 MED ORDER — NICOTINE 14 MG/24HR TD PT24: 14.0000 mg | MEDICATED_PATCH | Freq: Every day | TRANSDERMAL | 0 refills | Status: AC

## 2023-08-28 MED ORDER — NICOTINE 7 MG/24HR TD PT24: MEDICATED_PATCH | TRANSDERMAL | 0 refills | Status: AC

## 2023-08-28 NOTE — Progress Notes (Signed)
 Subjective:    Patient ID: Jacqueline Fox, female   DOB: [birth year], 68 y.o.   MRN: [id number]   HPI   Hypertension:  no problems with meds.    2.  Tobacco Use Disorder: Did not fill the nicotine  patches after annual 6 months ago.  She did obtain through her OTC med allowance once she obtained Medicare recently.  Was using 14 mg daily for 2.5 weeks, but ran out of patches then.  She did quit smoking, but recently, has "slipped" Plan was for her to use patches for total of 2 months.  CT scan of lungs without concern for cancer.    3.  Hyperlipidemia:    Cholesterol improved with excellent HDL and improved triglycerides.  LDL at 106.  She was not physically active until about a month ago as too cold.  She has been doing yard work.   Lipid Panel     Component Value Date/Time   CHOL 201 (H) 08/25/2023 0842   TRIG 160 (H) 08/25/2023 0842   HDL 67 08/25/2023 0842   CHOLHDL 3.6 04/03/2021 1930   VLDL 27 04/03/2021 1930   LDLCALC 106 (H) 08/25/2023 0842   LDLDIRECT 158.6 (H) 04/03/2021 1930   LABVLDL 28 08/25/2023 0842    4.  Labs with Rheum fine:  CMP and CBC.   Nonfasting glucose slightly high.  A1C in 2022 was 5.2%.    Current Meds  Medication Sig   alendronate  (FOSAMAX ) 70 MG tablet Take 1 tablet (70 mg total) by mouth once a week. Take with a full glass of water on an empty stomach.   amLODipine  (NORVASC ) 10 MG tablet Take 1 tablet by mouth once daily   aspirin  EC 81 MG tablet Take 1 tablet (81 mg total) by mouth daily. Swallow whole.   Calcium  Citrate-Vitamin D  (CITRACAL PETITES/VITAMIN D ) 200-6.25 MG-MCG TABS 2 caps by mouth twice daily   Fluocinolone  Acetonide Scalp (DERMA-SMOOTHE /FS SCALP) 0.01 % OIL Apply to affected areas of scalp once daily   Multiple Vitamins-Minerals (PRESERVISION AREDS 2) CAPS 1 tab cap by mouth twice daily   nicotine  (NICOTINE  STEP 2) 14 mg/24hr patch Place 14 mg patch to skin and replace in new area every 24 hours for 28 days then move to 7 mg  patches   omeprazole  (PRILOSEC) 20 MG capsule Take 1 capsule (20 mg total) by mouth daily.   predniSONE  (DELTASONE ) 5 MG tablet Take 5 mg by mouth daily with breakfast.   rosuvastatin  (CRESTOR ) 10 MG tablet Take 1 tablet by mouth once daily   Tocilizumab 162 MG/0.9ML SOAJ Inject into the skin once a week.   triamcinolone  (KENALOG ) 0.025 % ointment Apply scant amount to affected areas once daily as needed.   No Known Allergies   Review of Systems    Objective:   BP 130/78 (BP Location: Left Arm, Patient Position: Sitting, Cuff Size: Normal)   Pulse 90   Resp 16   Ht 5\' 3"  (1.6 m)   Wt 160 lb (72.6 kg)   LMP 02/27/2011   BMI 28.34 kg/m   Physical Exam NAD HEENT:  PERRL, EOMI Neck:  Supple, No adenopathy Chest:  CTA CV:  RRR with normal S1 and S2, No S3, S4 or murmur.  No carotid bruits.  Carotid, radial and DP pulses normal and equal Abd:  S, NT, No HSM or mass, + BS LE:  No edema.   Assessment & Plan   Hypertension:  controlled.  CPM  2.  Tobacco  Use Disorder:  partner does not smoke.  Will send the nicotine  patches and encouraged her to use 14 mg for 28 days followed by 28 days of 7 mg and get rid of all smoking paraphernalia.    3.  Giant cell arteritis/discoid lupus: former with loss of left vision.   labs fine on chronic Tocilizumab/low dose prednisone .    4.  Hyperlipidemia:  improved on Rosuvastatin .  Continue to work on diet and physical activity to get trigs down below 150;

## 2023-09-02 ENCOUNTER — Ambulatory Visit (INDEPENDENT_AMBULATORY_CARE_PROVIDER_SITE_OTHER)

## 2023-09-02 DIAGNOSIS — Z23 Encounter for immunization: Secondary | ICD-10-CM | POA: Diagnosis not present

## 2024-01-18 ENCOUNTER — Other Ambulatory Visit: Payer: Self-pay | Admitting: Internal Medicine

## 2024-01-21 ENCOUNTER — Other Ambulatory Visit: Payer: Self-pay

## 2024-01-21 MED ORDER — AMLODIPINE BESYLATE 10 MG PO TABS: 10.0000 mg | ORAL_TABLET | Freq: Every day | ORAL | 3 refills | Status: AC

## 2024-02-08 ENCOUNTER — Other Ambulatory Visit: Payer: Self-pay | Admitting: Internal Medicine

## 2024-02-23 ENCOUNTER — Other Ambulatory Visit

## 2024-02-23 DIAGNOSIS — R739 Hyperglycemia, unspecified: Secondary | ICD-10-CM

## 2024-02-24 LAB — HEMOGLOBIN A1C
Est. average glucose Bld gHb Est-mCnc: 108 mg/dL
Hgb A1c MFr Bld: 5.4 % (ref 4.8–5.6)

## 2024-02-26 ENCOUNTER — Encounter: Payer: Self-pay | Admitting: Internal Medicine

## 2024-02-26 ENCOUNTER — Ambulatory Visit: Payer: Medicare Other | Admitting: Internal Medicine

## 2024-02-26 ENCOUNTER — Other Ambulatory Visit: Payer: Self-pay | Admitting: Internal Medicine

## 2024-02-26 VITALS — BP 148/88 | HR 85 | Resp 18 | Ht 63.5 in | Wt 154.5 lb

## 2024-02-26 DIAGNOSIS — M316 Other giant cell arteritis: Secondary | ICD-10-CM

## 2024-02-26 DIAGNOSIS — Z0000 Encounter for general adult medical examination without abnormal findings: Secondary | ICD-10-CM

## 2024-02-26 DIAGNOSIS — Z124 Encounter for screening for malignant neoplasm of cervix: Secondary | ICD-10-CM

## 2024-02-26 DIAGNOSIS — L930 Discoid lupus erythematosus: Secondary | ICD-10-CM

## 2024-02-26 DIAGNOSIS — E782 Mixed hyperlipidemia: Secondary | ICD-10-CM

## 2024-02-26 DIAGNOSIS — I10 Essential (primary) hypertension: Secondary | ICD-10-CM

## 2024-02-26 DIAGNOSIS — F17200 Nicotine dependence, unspecified, uncomplicated: Secondary | ICD-10-CM

## 2024-02-26 NOTE — Progress Notes (Signed)
 Subjective:    Patient ID: Jacqueline Fox, female   DOB: 07/29/1955, 68 y.o.   MRN: [id number]   HPI  CPE with pap  1.  Pap:  Last in 2022 and normal.  Always normal.    2.  Mammogram:  Performed in January of 2025 and normal.  Dense breasts.  Paternal aunt with history of breast cancer.  Diagnosed in 50s to 60s.  Successfully treated.    3.  Osteoprevention:  Taking Alendronate  due to chronic prednisone  use for giant cell arteritis, tobacco use and postmenopausal.  Followed by Rheumatology, Redell Ness, PA, who initiated Alendronate .  Prednisone  now 5 mg every other day.  DEXA was normal in 2024.  Due again for recheck in 2026.  Had low Vitamin D  in 06/2022, but normalized with supplementation to 35.8 in 09/2022.  Is taking calcium  and Vitamin D .   She is walking 3 times weekly.  Generally about 60 minutes.    4.  Guaiac Cards/FIT:  Last in 2023 and negative for blood.  5.  Colonoscopy:  Last in 2019 with Dr. Donnald and without polyps, though does have diverticular disease.  Due next in 2029.  6.  Immunizations:  Needs COVID Immunization History  Administered Date(s) Administered    sv, Bivalent, Protein Subunit Rsvpref,pf [place]) 03/08/2022   Hepatitis A, Adult 03/04/2023, 09/02/2023   Influenza, Seasonal, Injecte, Preservative Fre 01/23/2024   Influenza,inj,Quad PF,6+ Mos 02/18/2018, 02/17/2019, 03/05/2022   Influenza-Unspecified 02/09/2020, 02/12/2021, 01/09/2023   Moderna Covid-19 Fall Seasonal Vaccine 31yrs & older 03/04/2023   Moderna Covid-19 Vaccine Bivalent Booster 32yrs & up 02/15/2021   Moderna SARS-COV2 Booster Vaccination 03/08/2022   Moderna Sars-Covid-2 Vaccination 07/12/2019, 08/09/2019, 03/13/2020   PNEUMOCOCCAL CONJUGATE-20 04/24/2023   Tdap 08/20/2017, 01/09/2023   Zoster Recombinant(Shingrix ) 04/24/2023, 09/02/2023     7.  Glucose/Cholesterol:  Glucose minimally high in past, but A1C well within normal range and at 5/4% 3 days ago. Triglycerides  slightly high on Rosuvastatin  with last check in 2025, April  Lipid Panel     Component Value Date/Time   CHOL 201 (H) 08/25/2023 0842   TRIG 160 (H) 08/25/2023 0842   HDL 67 08/25/2023 0842   CHOLHDL 3.6 04/03/2021 1930   VLDL 27 04/03/2021 1930   LDLCALC 106 (H) 08/25/2023 0842   LDLDIRECT 158.6 (H) 04/03/2021 1930   LABVLDL 28 08/25/2023 0842     Current Meds  Medication Sig   alendronate  (FOSAMAX ) 70 MG tablet Take 1 tablet (70 mg total) by mouth once a week. Take with a full glass of water on an empty stomach.   amLODipine  (NORVASC ) 10 MG tablet Take 1 tablet (10 mg total) by mouth daily.   aspirin  EC 81 MG tablet Take 1 tablet (81 mg total) by mouth daily. Swallow whole.   Calcium  Citrate-Vitamin D  (CITRACAL PETITES/VITAMIN D ) 200-6.25 MG-MCG TABS 2 caps by mouth twice daily   Fluocinolone  Acetonide Scalp (DERMA-SMOOTHE /FS SCALP) 0.01 % OIL Apply to affected areas of scalp once daily   Multiple Vitamins-Minerals (PRESERVISION AREDS 2) CAPS 1 tab cap by mouth twice daily   omeprazole  (PRILOSEC) 20 MG capsule Take 1 capsule by mouth once daily   predniSONE  (DELTASONE ) 5 MG tablet Take 5 mg by mouth daily with breakfast.   rosuvastatin  (CRESTOR ) 10 MG tablet Take 1 tablet (10 mg total) by mouth daily.   triamcinolone  (KENALOG ) 0.025 % ointment Apply scant amount to affected areas once daily as needed.   No Known Allergies  Past Medical History:  Diagnosis Date   Discoid lupus    Follows with Derm   Family history of breast cancer    Family history of cholangiocarcinoma    Sister   Family history of colon cancer    Patient has had genetic testing and not found to have genetic predisposition.  Recommendations for cancer screenings as usual per Darice Single her note 2019 please   Family history of lung cancer    Brother   Family history of prostate cancer    Genetic testing 11/27/2017   Negative genetic testing on the Multicancer panel.  The Multi-Gene Panel offered  by [facility] includes sequencing and/or deletion duplication testing of the following 84 genes: AIP, ALK, APC, ATM, AXIN2,BAP1,  BARD1, BLM, BMPR1A, BRCA1, BRCA2, BRIP1, CASR, CDC73, CDH1, CDK4, CDKN1B, CDKN1C, CDKN2A (p14ARF), CDKN2A (p16INK4a), CEBPA, CHEK2, CTNNA1, DICER1, DIS3L2, EGFR (c.2369C>T, p.Thr790Met variant   Giant cell arteritis (HCC) 2023   With loss of vision of left eye.   Hyperlipidemia    Primary hypertension    Past Surgical History:  Procedure Laterality Date   COLONOSCOPY WITH PROPOFOL  N/A 02/12/2018   Procedure: COLONOSCOPY WITH PROPOFOL ;  Surgeon: Donnald Charleston, MD;  Location: [facility];  Service: Endoscopy;  Laterality: N/A;   TEMPORAL ARTERY BIOPSY / LIGATION Left 05/2021   Family History  Problem Relation Age of Onset   Diabetes Mother    Heart disease Mother        CHF   Hypertension Mother    Kidney disease Mother    Arthritis Mother        OA   Dementia Mother    Cancer Father 33       colon and prostate   Cancer Sister 50       ?cholangiocarcinoma   Cancer Brother 40       lung--smoker  also, prostate CA when 38-54 yo   Cancer Brother 87       prostate   Cancer Brother 52       Prostate--treated successfuly   Diabetes Son    Kidney disease Son        Stage IV CKD from poorly controlled Htn   Hypertension Son    Kidney disease Paternal Aunt        peritoneal dialysis--not clear what diagnosis   Cancer Paternal Aunt 6       Breast Cancer   Cancer Paternal Aunt        Bone cancer   Kidney disease Paternal Aunt        Dialysis:  not clear of etiology   Heart disease Paternal Aunt    Stroke Paternal Grandmother    Prostate cancer Cousin        mat first cousin   Cancer Other 73       colon--sister's daughter (sister with gallbladder duct cancer)   Social History   Socioeconomic History   Marital status: Media Planner    Spouse name: Drue Kussmaul   Number of children: 4   Years of education: 14   Highest education level:  Associate degree: academic program  Occupational History   Occupation: Retired    Comment: Not a paid position  Tobacco Use   Smoking status: Every Day    Current packs/day: 0.30    Average packs/day: 0.3 packs/day for 43.0 years (12.9 ttl pk-yrs)    Types: Cigarettes   Smokeless tobacco: Never   Tobacco comments:    Not clear if she is ready to quit, despite family history  of cancers.  No interest in support.  Vaping Use   Vaping status: Never Used  Substance and Sexual Activity   Alcohol use: Yes    Comment: Drinking 2 glasses wine over ice daily.   Drug use: Yes    Types: Marijuana    Comment: She tried edibles, but froze her up.  She smokes a bit daily.   Sexual activity: Not Currently  Other Topics Concern   Not on file  Social History Narrative   Has been together with female partner for 31 years (2025).   He is now being treated for prostate cancer.   He does not smoke.     Currently just lives with her partner, Drue   Social Drivers of Health   Financial Resource Strain: Low Risk  (02/26/2024)   Overall Financial Resource Strain (CARDIA)    Difficulty of Paying Living Expenses: Not hard at all  Food Insecurity: No Food Insecurity (02/26/2024)   Hunger Vital Sign    Worried About Running Out of Food in the Last Year: Never true    Ran Out of Food in the Last Year: Never true  Transportation Needs: No Transportation Needs (02/26/2024)   PRAPARE - Administrator, Civil Service (Medical): No    Lack of Transportation (Non-Medical): No  Physical Activity: Inactive (02/11/2018)   Exercise Vital Sign    Days of Exercise per Week: 0 days    Minutes of Exercise per Session: 0 min  Stress: No Stress Concern Present (08/20/2017)   [employer] of Occupational Health - Occupational Stress Questionnaire    Feeling of Stress : Only a little  Social Connections: Socially Integrated (02/11/2018)   Social Connection and Isolation Panel    Frequency of  Communication with Friends and Family: More than three times a week    Frequency of Social Gatherings with Friends and Family: More than three times a week    Attends Religious Services: More than 4 times per year    Active Member of [employer] or Organizations: Yes    Attends Banker Meetings: More than 4 times per year    Marital Status: Living with partner  Intimate Partner Violence: Not At Risk (02/26/2024)   Humiliation, Afraid, Rape, and Kick questionnaire    Fear of Current or Ex-Partner: No    Emotionally Abused: No    Physically Abused: No    Sexually Abused: No      Review of Systems  HENT:  Negative for dental problem (Dentures).   Eyes:  Negative for visual disturbance (No change in decreased vision of left eye from complications of giant cell arteritis.  Follows with Dr. Octavia.).  Respiratory:  Negative for shortness of breath.   Cardiovascular:  Negative for chest pain, palpitations and leg swelling.  Gastrointestinal:  Negative for abdominal pain and blood in stool (No melena).  Genitourinary:  Negative for vaginal bleeding.  Musculoskeletal:        No pelvic or shoulder girdle stiffness  Skin:        Does not feel biologic helped with loss of skin pigmentation.  She has not contacted her dermatologist  Neurological:  Negative for headaches.  Psychiatric/Behavioral:  Negative for dysphoric mood and sleep disturbance. The patient is not nervous/anxious.       Objective:   BP (!) 148/88 (BP Location: Right Arm, Patient Position: Sitting, Cuff Size: Normal)   Pulse 85   Resp 18   Ht 5' 3.5 (1.613 m)  Wt 154 lb 8 oz (70.1 kg)   LMP 02/27/2011   BMI 26.94 kg/m   Physical Exam Constitutional:      Appearance: Normal appearance.  HENT:     Head: Normocephalic and atraumatic.     Right Ear: Tympanic membrane, ear canal and external ear normal.     Left Ear: Tympanic membrane, ear canal and external ear normal.     Nose: Nose normal.      Mouth/Throat:     Mouth: Mucous membranes are moist.     Pharynx: Oropharynx is clear.  Eyes:     Extraocular Movements: Extraocular movements intact.     Conjunctiva/sclera: Conjunctivae normal.     Pupils: Pupils are equal, round, and reactive to light.  Neck:     Thyroid: No thyroid mass or thyromegaly.  Cardiovascular:     Rate and Rhythm: Normal rate and regular rhythm.     Heart sounds: S1 normal and S2 normal. No murmur heard.    No friction rub. No S3 or S4 sounds.     Comments: No carotid bruits.  Carotid, radial, femoral, DP and PT pulses normal and equal.   Pulmonary:     Effort: Pulmonary effort is normal.     Breath sounds: Normal breath sounds and air entry.  Chest:  Breasts:    Right: No inverted nipple, mass or nipple discharge.     Left: No inverted nipple, mass or nipple discharge.  Abdominal:     General: Bowel sounds are normal.     Palpations: Abdomen is soft. There is no hepatomegaly, splenomegaly or mass.     Tenderness: There is no abdominal tenderness.     Hernia: No hernia is present.  Genitourinary:    Comments: Normal external female genitalia No cervical lesion. No uterine or adnexal mass or tenderness.   Musculoskeletal:        General: Normal range of motion.     Cervical back: Normal range of motion and neck supple.     Right lower leg: No edema.     Left lower leg: No edema.  Lymphadenopathy:     Head:     Right side of head: No submental or submandibular adenopathy.     Left side of head: No submental or submandibular adenopathy.     Cervical: No cervical adenopathy.     Upper Body:     Right upper body: No supraclavicular or axillary adenopathy.     Left upper body: No supraclavicular or axillary adenopathy.     Lower Body: No right inguinal adenopathy. No left inguinal adenopathy.  Skin:    General: Skin is warm.     Capillary Refill: Capillary refill takes less than 2 seconds.     Comments: Loss of pigment on ears and hairline  where previously with inflammation  Neurological:     General: No focal deficit present.     Mental Status: She is alert and oriented to person, place, and time.     Cranial Nerves: Cranial nerves 2-12 are intact.     Sensory: Sensation is intact.     Motor: Motor function is intact.     Coordination: Coordination is intact.     Gait: Gait is intact.     Deep Tendon Reflexes: Reflexes are normal and symmetric.     Comments: CN:  save for vision  Psychiatric:        Mood and Affect: Mood normal.        Behavior: Behavior  normal.      Assessment & Plan   CPE with pap Call for COVID vaccine--on order. Return for fasting labs  FIT to return tomorrow.  2.  Hypertension:  smoked before coming in today.    3.  Hyperlipidemia:  at goal save for Triglycerides previously, though close.  Continue Rosuvastatin .  FLP  with labs on Friday  4.  Discoid lupus with loss of pigment--to call Derm and ask if anything new as did not see a difference with previous treatment.  5.  Giant Cell Arteritis.  Prednisone  5 mg every other day with Rheum.  Continuing on Alendronate  to prevent osteoporosis.  DEXA next year.    6.  Tobacco abuse:  encouraged quitting.  Options known.    7.  Waking in middle of night:  went over good sleep practices.

## 2024-02-26 NOTE — Patient Instructions (Signed)
Warm bath and reading before bed. If unable to get to sleep in 20 minutes, get out of bed, go somewhere calm and read until you are sleepy, then back to bed. If awaken and unable to get back to sleep in 20 minutes--read as above until sleepy. Physical activity with a friend every day--gradually work up to 1 hour.  Do not exercise close to bedtime. No napping  

## 2024-02-27 LAB — CYTOLOGY - PAP

## 2024-03-01 ENCOUNTER — Ambulatory Visit: Payer: Self-pay | Admitting: Internal Medicine

## 2024-03-12 ENCOUNTER — Other Ambulatory Visit

## 2024-03-23 ENCOUNTER — Other Ambulatory Visit: Payer: Self-pay | Admitting: Internal Medicine

## 2024-03-26 ENCOUNTER — Other Ambulatory Visit (INDEPENDENT_AMBULATORY_CARE_PROVIDER_SITE_OTHER)

## 2024-03-26 VITALS — BP 124/80 | HR 81

## 2024-03-26 DIAGNOSIS — Z0000 Encounter for general adult medical examination without abnormal findings: Secondary | ICD-10-CM

## 2024-03-26 DIAGNOSIS — E782 Mixed hyperlipidemia: Secondary | ICD-10-CM

## 2024-03-26 DIAGNOSIS — Z0130 Encounter for examination of blood pressure without abnormal findings: Secondary | ICD-10-CM | POA: Diagnosis not present

## 2024-03-26 NOTE — Progress Notes (Signed)
 Patient reports that she is taking bp medication. Patient did not take medication this morning.

## 2024-03-27 LAB — CBC WITH DIFFERENTIAL/PLATELET
Basophils Absolute: 0 x10E3/uL (ref 0.0–0.2)
Basos: 1 %
EOS (ABSOLUTE): 0.1 x10E3/uL (ref 0.0–0.4)
Eos: 2 %
Hematocrit: 42.6 % (ref 34.0–46.6)
Hemoglobin: 14.2 g/dL (ref 11.1–15.9)
Immature Grans (Abs): 0 x10E3/uL (ref 0.0–0.1)
Immature Granulocytes: 0 %
Lymphocytes Absolute: 1.4 x10E3/uL (ref 0.7–3.1)
Lymphs: 41 %
MCH: 33.3 pg — ABNORMAL HIGH (ref 26.6–33.0)
MCHC: 33.3 g/dL (ref 31.5–35.7)
MCV: 100 fL — ABNORMAL HIGH (ref 79–97)
Monocytes Absolute: 0.4 x10E3/uL (ref 0.1–0.9)
Monocytes: 11 %
Neutrophils Absolute: 1.5 x10E3/uL (ref 1.4–7.0)
Neutrophils: 45 %
Platelets: 224 x10E3/uL (ref 150–450)
RBC: 4.27 x10E6/uL (ref 3.77–5.28)
RDW: 13.4 % (ref 11.7–15.4)
WBC: 3.4 x10E3/uL (ref 3.4–10.8)

## 2024-03-27 LAB — COMPREHENSIVE METABOLIC PANEL WITH GFR
ALT: 39 IU/L — ABNORMAL HIGH (ref 0–32)
AST: 39 IU/L (ref 0–40)
Albumin: 4.8 g/dL (ref 3.9–4.9)
Alkaline Phosphatase: 47 IU/L — ABNORMAL LOW (ref 49–135)
BUN/Creatinine Ratio: 12 (ref 12–28)
BUN: 5 mg/dL — ABNORMAL LOW (ref 8–27)
Bilirubin Total: 0.6 mg/dL (ref 0.0–1.2)
CO2: 21 mmol/L (ref 20–29)
Calcium: 10.2 mg/dL (ref 8.7–10.3)
Chloride: 102 mmol/L (ref 96–106)
Creatinine, Ser: 0.41 mg/dL — ABNORMAL LOW (ref 0.57–1.00)
Globulin, Total: 2.4 g/dL (ref 1.5–4.5)
Glucose: 107 mg/dL — ABNORMAL HIGH (ref 70–99)
Potassium: 3.4 mmol/L — ABNORMAL LOW (ref 3.5–5.2)
Sodium: 141 mmol/L (ref 134–144)
Total Protein: 7.2 g/dL (ref 6.0–8.5)
eGFR: 109 mL/min/1.73 (ref >59)

## 2024-03-27 LAB — LIPID PANEL W/O CHOL/HDL RATIO
Cholesterol, Total: 206 mg/dL — ABNORMAL HIGH (ref 100–199)
HDL: 71 mg/dL (ref >39)
LDL Chol Calc (NIH): 104 mg/dL — ABNORMAL HIGH (ref 0–99)
Triglycerides: 183 mg/dL — ABNORMAL HIGH (ref 0–149)
VLDL Cholesterol Cal: 31 mg/dL (ref 5–40)

## 2024-03-29 ENCOUNTER — Ambulatory Visit: Payer: Self-pay | Admitting: Internal Medicine

## 2024-04-19 ENCOUNTER — Ambulatory Visit: Payer: Self-pay | Admitting: Internal Medicine

## 2024-05-17 ENCOUNTER — Other Ambulatory Visit: Payer: Self-pay | Admitting: Internal Medicine

## 2024-05-17 DIAGNOSIS — Z1231 Encounter for screening mammogram for malignant neoplasm of breast: Secondary | ICD-10-CM

## 2024-05-28 ENCOUNTER — Ambulatory Visit

## 2024-06-01 ENCOUNTER — Ambulatory Visit
Admission: RE | Admit: 2024-06-01 | Discharge: 2024-06-01 | Disposition: A | Source: Ambulatory Visit | Attending: Internal Medicine | Admitting: Internal Medicine

## 2024-06-01 DIAGNOSIS — Z1231 Encounter for screening mammogram for malignant neoplasm of breast: Secondary | ICD-10-CM

## 2024-06-08 MED ORDER — ROSUVASTATIN CALCIUM 20 MG PO TABS: 20.0000 mg | ORAL_TABLET | Freq: Every day | ORAL | 11 refills | Status: AC

## 2024-06-08 NOTE — Addendum Note (Signed)
 Addended by: ADELLA ALMARIE HERO on: 06/08/2024 12:28 PM   Modules accepted: Orders

## 2024-06-10 NOTE — Progress Notes (Signed)
 The patient is scheduled.

## 2024-08-09 ENCOUNTER — Other Ambulatory Visit

## 2024-08-26 ENCOUNTER — Ambulatory Visit: Admitting: Internal Medicine

## 2025-03-01 ENCOUNTER — Other Ambulatory Visit: Admitting: Internal Medicine

## 2025-03-03 ENCOUNTER — Encounter: Admitting: Internal Medicine
